# Patient Record
Sex: Female | Born: 1940 | Race: White | Hispanic: No | Smoking: Former smoker
Health system: Southern US, Community
[De-identification: ages and names within clinical notes are randomized; demographics above are authoritative.]

## PROBLEM LIST (undated history)

## (undated) DIAGNOSIS — E785 Hyperlipidemia, unspecified: Secondary | ICD-10-CM

## (undated) DIAGNOSIS — I10 Essential (primary) hypertension: Secondary | ICD-10-CM

## (undated) DIAGNOSIS — E119 Type 2 diabetes mellitus without complications: Secondary | ICD-10-CM

## (undated) HISTORY — DX: Essential (primary) hypertension: I10

## (undated) HISTORY — DX: Hyperlipidemia, unspecified: E78.5

## (undated) HISTORY — DX: Type 2 diabetes mellitus without complications: E11.9

---

## 2006-10-25 ENCOUNTER — Ambulatory Visit: Payer: Self-pay | Admitting: Family Medicine

## 2006-10-26 ENCOUNTER — Ambulatory Visit: Payer: Self-pay | Admitting: Gastroenterology

## 2007-02-06 ENCOUNTER — Ambulatory Visit: Payer: Self-pay | Admitting: Gastroenterology

## 2008-02-26 ENCOUNTER — Ambulatory Visit: Payer: Self-pay | Admitting: Ophthalmology

## 2009-06-22 ENCOUNTER — Ambulatory Visit: Payer: Self-pay | Admitting: Gastroenterology

## 2009-06-22 LAB — HM COLONOSCOPY

## 2011-05-19 LAB — HM DEXA SCAN: HM DEXA SCAN: NORMAL

## 2013-05-12 ENCOUNTER — Emergency Department: Payer: Self-pay | Admitting: Emergency Medicine

## 2013-05-12 LAB — CBC
HCT: 37.2 % (ref 35.0–47.0)
HGB: 13 g/dL (ref 12.0–16.0)
MCH: 31 pg (ref 26.0–34.0)
MCHC: 34.9 g/dL (ref 32.0–36.0)
MCV: 89 fL (ref 80–100)
Platelet: 227 10*3/uL (ref 150–440)
RBC: 4.18 10*6/uL (ref 3.80–5.20)
RDW: 13.1 % (ref 11.5–14.5)
WBC: 7.6 10*3/uL (ref 3.6–11.0)

## 2013-05-12 LAB — BASIC METABOLIC PANEL
Anion Gap: 2 — ABNORMAL LOW (ref 7–16)
BUN: 33 mg/dL — ABNORMAL HIGH (ref 7–18)
CHLORIDE: 108 mmol/L — AB (ref 98–107)
CO2: 28 mmol/L (ref 21–32)
CREATININE: 1.23 mg/dL (ref 0.60–1.30)
Calcium, Total: 8.5 mg/dL (ref 8.5–10.1)
EGFR (African American): 51 — ABNORMAL LOW
EGFR (Non-African Amer.): 44 — ABNORMAL LOW
Glucose: 195 mg/dL — ABNORMAL HIGH (ref 65–99)
Osmolality: 288 (ref 275–301)
POTASSIUM: 4.7 mmol/L (ref 3.5–5.1)
Sodium: 138 mmol/L (ref 136–145)

## 2014-01-01 ENCOUNTER — Ambulatory Visit: Payer: Self-pay | Admitting: Ophthalmology

## 2014-01-01 DIAGNOSIS — Z01810 Encounter for preprocedural cardiovascular examination: Secondary | ICD-10-CM

## 2014-01-01 DIAGNOSIS — I10 Essential (primary) hypertension: Secondary | ICD-10-CM

## 2014-01-06 LAB — BASIC METABOLIC PANEL
BUN: 35 mg/dL — AB (ref 4–21)
CREATININE: 1.2 mg/dL — AB (ref 0.5–1.1)
GLUCOSE: 176 mg/dL
POTASSIUM: 5.4 mmol/L — AB (ref 3.4–5.3)
SODIUM: 139 mmol/L (ref 137–147)

## 2014-01-06 LAB — LIPID PANEL
CHOLESTEROL: 159 mg/dL (ref 0–200)
HDL: 44 mg/dL (ref 35–70)
LDL Cholesterol: 90 mg/dL
Triglycerides: 125 mg/dL (ref 40–160)

## 2014-01-14 ENCOUNTER — Ambulatory Visit: Payer: Self-pay | Admitting: Ophthalmology

## 2014-08-09 NOTE — Op Note (Signed)
PATIENT NAME:  Rachel Lester, Rachel Lester MR#:  [id number] DATE OF BIRTH:  07/14/1940  DATE OF PROCEDURE:  01/14/2014  PREOPERATIVE DIAGNOSIS: Visually significant cataract of the right eye.   POSTOPERATIVE DIAGNOSIS: Visually significant cataract of the right eye.   OPERATIVE PROCEDURE: Cataract extraction by phacoemulsification with implant of intraocular lens to right eye.   SURGEON: Birder Robson, MD.   ANESTHESIA:  1. Managed anesthesia care.  2. Topical tetracaine drops followed by 2% Xylocaine jelly applied in the preoperative holding area.   COMPLICATIONS: None.   TECHNIQUE:  Stop and chop.  DESCRIPTION OF PROCEDURE: The patient was examined and consented in the preoperative holding area where the aforementioned topical anesthesia was applied to the right eye and then brought back to the Operating Room where the right eye was prepped and draped in the usual sterile ophthalmic fashion and a lid speculum was placed. A paracentesis was created with the side port blade and the anterior chamber was filled with viscoelastic. A near clear corneal incision was performed with the steel keratome. A continuous curvilinear capsulorrhexis was performed with a cystotome followed by the capsulorrhexis forceps. Hydrodissection and hydrodelineation were carried out with BSS on a blunt cannula. The lens was removed in a stop and chop technique and the remaining cortical material was removed with the irrigation-aspiration handpiece. The capsular bag was inflated with viscoelastic and the Alcon SN60WF 18.5-diopter lens, serial number [id number] was placed in the capsular bag without complication. The remaining viscoelastic was removed from the eye with the irrigation-aspiration handpiece. The wounds were hydrated. The anterior chamber was flushed with Miostat and the eye was inflated to physiologic pressure. 0.1 mL of cefuroxime concentration 10 mg/mL was placed in the anterior chamber. The wounds were found to be  water tight. The eye was dressed with Vigamox. The patient was given protective glasses to wear throughout the day and a shield with which to sleep tonight. The patient was also given drops with which to begin a drop regimen today and will follow-up with me in one day.    ____________________________ Livingston Diones. Carlisa Eble, MD wlp:TT D: 01/14/2014 14:05:00 ET T: 01/14/2014 14:18:43 ET JOB#: [id number]  cc: Keundra Petrucelli L. Kevontae Burgoon, MD, <Dictator> Livingston Diones Shaheen Star MD ELECTRONICALLY SIGNED 01/15/2014 11:40

## 2014-08-18 DIAGNOSIS — I10 Essential (primary) hypertension: Secondary | ICD-10-CM | POA: Diagnosis not present

## 2014-08-18 DIAGNOSIS — N183 Chronic kidney disease, stage 3 (moderate): Secondary | ICD-10-CM | POA: Diagnosis not present

## 2014-08-18 DIAGNOSIS — E780 Pure hypercholesterolemia: Secondary | ICD-10-CM | POA: Diagnosis not present

## 2014-08-18 LAB — HEMOGLOBIN A1C: HEMOGLOBIN A1C: 7.4 % — AB (ref 4.0–6.0)

## 2014-11-24 ENCOUNTER — Other Ambulatory Visit: Payer: Self-pay | Admitting: Family Medicine

## 2014-12-01 LAB — HM DIABETES EYE EXAM

## 2014-12-02 DIAGNOSIS — E875 Hyperkalemia: Secondary | ICD-10-CM

## 2014-12-02 DIAGNOSIS — N183 Chronic kidney disease, stage 3 unspecified: Secondary | ICD-10-CM

## 2014-12-02 DIAGNOSIS — E1122 Type 2 diabetes mellitus with diabetic chronic kidney disease: Secondary | ICD-10-CM

## 2014-12-02 DIAGNOSIS — R809 Proteinuria, unspecified: Secondary | ICD-10-CM

## 2014-12-02 DIAGNOSIS — R319 Hematuria, unspecified: Secondary | ICD-10-CM

## 2014-12-02 DIAGNOSIS — I10 Essential (primary) hypertension: Secondary | ICD-10-CM

## 2014-12-02 DIAGNOSIS — E7800 Pure hypercholesterolemia, unspecified: Secondary | ICD-10-CM

## 2014-12-16 ENCOUNTER — Encounter: Payer: Self-pay | Admitting: Family Medicine

## 2014-12-29 ENCOUNTER — Other Ambulatory Visit: Payer: Self-pay | Admitting: Family Medicine

## 2014-12-30 DIAGNOSIS — N183 Chronic kidney disease, stage 3 (moderate): Secondary | ICD-10-CM

## 2014-12-30 DIAGNOSIS — E1122 Type 2 diabetes mellitus with diabetic chronic kidney disease: Secondary | ICD-10-CM | POA: Insufficient documentation

## 2014-12-30 DIAGNOSIS — R319 Hematuria, unspecified: Secondary | ICD-10-CM | POA: Insufficient documentation

## 2014-12-30 DIAGNOSIS — R809 Proteinuria, unspecified: Secondary | ICD-10-CM | POA: Insufficient documentation

## 2014-12-30 DIAGNOSIS — E875 Hyperkalemia: Secondary | ICD-10-CM | POA: Insufficient documentation

## 2014-12-30 DIAGNOSIS — N1832 Chronic kidney disease, stage 3b: Secondary | ICD-10-CM | POA: Insufficient documentation

## 2014-12-30 DIAGNOSIS — K5790 Diverticulosis of intestine, part unspecified, without perforation or abscess without bleeding: Secondary | ICD-10-CM | POA: Insufficient documentation

## 2014-12-30 DIAGNOSIS — I10 Essential (primary) hypertension: Secondary | ICD-10-CM | POA: Insufficient documentation

## 2014-12-30 DIAGNOSIS — E7800 Pure hypercholesterolemia, unspecified: Secondary | ICD-10-CM | POA: Insufficient documentation

## 2015-01-12 ENCOUNTER — Ambulatory Visit (INDEPENDENT_AMBULATORY_CARE_PROVIDER_SITE_OTHER): Payer: Medicare Other | Admitting: Family Medicine

## 2015-01-12 ENCOUNTER — Encounter: Payer: Self-pay | Admitting: Family Medicine

## 2015-01-12 VITALS — BP 140/60 | HR 76 | Temp 97.9°F | Resp 16 | Ht 66.5 in | Wt 157.0 lb

## 2015-01-12 DIAGNOSIS — Z23 Encounter for immunization: Secondary | ICD-10-CM | POA: Diagnosis not present

## 2015-01-12 DIAGNOSIS — E7800 Pure hypercholesterolemia, unspecified: Secondary | ICD-10-CM

## 2015-01-12 DIAGNOSIS — R809 Proteinuria, unspecified: Secondary | ICD-10-CM

## 2015-01-12 DIAGNOSIS — Z0000 Encounter for general adult medical examination without abnormal findings: Secondary | ICD-10-CM | POA: Diagnosis not present

## 2015-01-12 DIAGNOSIS — I10 Essential (primary) hypertension: Secondary | ICD-10-CM

## 2015-01-12 DIAGNOSIS — N189 Chronic kidney disease, unspecified: Secondary | ICD-10-CM | POA: Diagnosis not present

## 2015-01-12 DIAGNOSIS — N183 Chronic kidney disease, stage 3 unspecified: Secondary | ICD-10-CM

## 2015-01-12 DIAGNOSIS — E1122 Type 2 diabetes mellitus with diabetic chronic kidney disease: Secondary | ICD-10-CM

## 2015-01-12 NOTE — Progress Notes (Signed)
Patient: Rachel Lester, Female    DOB: 11-Mar-1941, 74 y.o.   MRN: [id number] Visit Date: 01/12/2015  Today's Provider: Lelon Huh, MD   Chief Complaint  Patient presents with  . Diabetes  . Hyperlipidemia  . Hypertension  . Annual Exam   Subjective:    Annual wellness visit Rachel Lester is a 74 y.o. female. She feels well. She reports exercising , walks 2 times a weeks for 5 minutes on the treadmill. She reports she is sleeping well.  Last PCP: 01/06/14 Last pap: 04/09/09 Normal Colonoscopy: 06/22/09 Diverticulosis. -----------------------------------------------------------  Diabetes Mellitus Type II, Follow-up:   Lab Results  Component Value Date   HGBA1C 7.4* 08/18/2014   Last seen for diabetes 5 months ago.  Management since then includes continue same medications. . She reports good compliance with treatment. She is not having side effects.    Episodes of hypoglycemia? no   Most Recent Eye Exam: September 2016 Weight trend: stable   ------------------------------------------------------------------------   Hypertension, follow-up:  BP Readings from Last 3 Encounters:  01/12/15 140/60  08/18/14 110/60    She was last seen for hypertension 5 months ago.  BP at that visit was 110/60 . Management since that visit includes Continue current medications.  She reports good compliance with treatment. She is not having side effects.  She is exercising. She is adherent to low salt diet.   Patient denies chest pain, chest pressure/discomfort, claudication, dyspnea, fatigue, irregular heart beat, lower extremity edema, orthopnea, palpitations, paroxysmal nocturnal dyspnea and syncope.     ------------------------------------------------------------------------    Lipid/Cholesterol, Follow-up:   Last seen for this 5 months ago.  Management since that visit includes continuing same medications. .  Last Lipid Panel:    Component Value  Date/Time   CHOL 159 01/06/2014   TRIG 125 01/06/2014   HDL 44 01/06/2014   LDLCALC 90 01/06/2014    She reports excellent compliance with treatment. She is not having side effects.   Wt Readings from Last 3 Encounters:  01/12/15 157 lb (71.215 kg)  08/18/14 153 lb (69.4 kg)    ------------------------------------------------------------------------     Review of Systems  Constitutional: Negative for fever, chills and fatigue.  HENT: Negative for congestion, ear pain, rhinorrhea, sneezing and sore throat.   Eyes: Negative.  Negative for pain and redness.  Respiratory: Negative for cough, shortness of breath and wheezing.   Cardiovascular: Negative for chest pain and leg swelling.  Gastrointestinal: Negative for nausea, abdominal pain, diarrhea, constipation and blood in stool.  Endocrine: Negative for polydipsia and polyphagia.  Genitourinary: Negative.  Negative for dysuria, hematuria, flank pain, vaginal bleeding, vaginal discharge and pelvic pain.  Musculoskeletal: Negative for back pain, joint swelling, arthralgias and gait problem.  Skin: Negative for rash.  Neurological: Negative.  Negative for dizziness, tremors, seizures, weakness, light-headedness, numbness and headaches.  Hematological: Negative for adenopathy.  Psychiatric/Behavioral: Negative.  Negative for behavioral problems, confusion and dysphoric mood. The patient is not nervous/anxious and is not hyperactive.     Social History   Social History  . Marital Status: Married    Spouse Name: N/A  . Number of Children: N/A  . Years of Education: N/A   Occupational History  . Not on file.   Social History Main Topics  . Smoking status: Former Research scientist (life sciences)  . Smokeless tobacco: Never Used     Comment: smoked about 2 packs per day, smoked for about 20 years; Qut in 1980  . Alcohol Use:  Yes     Comment: occasional  . Drug Use: Not on file  . Sexual Activity: Not on file   Other Topics Concern  . Not on file     Social History Narrative    Patient Active Problem List   Diagnosis Date Noted  . Diverticulosis 12/30/2014  . Diabetes mellitus with chronic kidney disease 12/30/2014  . Hypercholesterolemia 12/30/2014  . Hyperkalemia 12/30/2014  . Hypertension 12/30/2014  . Chronic kidney disease, stage III (moderate) 12/30/2014  . Hematuria 12/30/2014  . Microalbuminuria 12/30/2014    History reviewed. No pertinent past surgical history.  Her family history includes Alzheimer's disease in her mother; Arthritis in her sister; Coronary artery disease in her father; Diabetes in her brother and sister.    Previous Medications   ASPIRIN (ASPIRIN ADULT LOW STRENGTH) 81 MG EC TABLET    Take 81 mg by mouth daily. Swallow whole.   B COMPLEX VITAMINS TABLET    Take 1 tablet by mouth daily.   BIOTIN 7500 MCG TABS    Take by mouth.   CHOLECALCIFEROL (VITAMIN D3) 2000 UNITS CAPSULE    Take 2,000 Units by mouth daily.   CYANOCOBALAMIN (VITAMIN B 12 PO)    Take 600 mg by mouth daily.   GLIMEPIRIDE (AMARYL) 4 MG TABLET    Take 4 mg by mouth daily with breakfast.   GLUCOSE BLOOD (ACCU-CHEK AVIVA PLUS) TEST STRIP    ACCU-CHEK AVIVA PLUS (In Vitro Strip)  1 (one) Strip three times daily for 0 days  Quantity: 300;  Refills: 3   Ordered :27-Jan-2014  Lelon Huh MD;  Started 27-Jan-2014 Active   LOSARTAN-HYDROCHLOROTHIAZIDE (HYZAAR) 100-25 MG PER TABLET    TAKE ONE (1) TABLET BY MOUTH EVERY DAY   LOVASTATIN (MEVACOR) 40 MG TABLET    Take 40 mg by mouth at bedtime.   OMEGA-3 FATTY ACIDS (FISH OIL BURP-LESS) 1200 MG CAPS    Take 1 capsule by mouth daily.   PIOGLITAZONE (ACTOS) 15 MG TABLET    TAKE ONE (1) TABLET EACH DAY    Patient Care Team: Birdie Sons, MD as PCP - General (Family Medicine)     Objective:   Vitals: BP 140/60 mmHg  Pulse 76  Temp(Src) 97.9 F (36.6 C) (Oral)  Resp 16  Ht 5' 6.5" (1.689 m)  Wt 157 lb (71.215 kg)  BMI 24.96 kg/m2  Physical Exam   General Appearance:     Alert, cooperative, no distress, appears stated age  Head:    Normocephalic, without obvious abnormality, atraumatic  Eyes:    PERRL, conjunctiva/corneas clear, EOM's intact, fundi    benign, both eyes  Ears:    Normal TM's and external ear canals, both ears  Nose:   Nares normal, septum midline, mucosa normal, no drainage    or sinus tenderness  Throat:   Lips, mucosa, and tongue normal; teeth and gums normal  Neck:   Supple, symmetrical, trachea midline, no adenopathy;    thyroid:  no enlargement/tenderness/nodules; no carotid   bruit or JVD  Back:     Symmetric, no curvature, ROM normal, no CVA tenderness  Lungs:     Clear to auscultation bilaterally, respirations unlabored  Chest Wall:    No tenderness or deformity   Heart:    Regular rate and rhythm, S1 and S2 normal, no murmur, rub   or gallop  Breast Exam:    normal appearance, no masses or tenderness  Abdomen:     Soft, non-tender, bowel sounds active  all four quadrants,    no masses, no organomegaly  Pelvic:    deferred  Extremities:   Extremities normal, atraumatic, no cyanosis or edema  Pulses:   2+ and symmetric all extremities  Skin:   Skin color, texture, turgor normal, no rashes or lesions  Lymph nodes:   Cervical, supraclavicular, and axillary nodes normal  Neurologic:   CNII-XII intact, normal strength, sensation and reflexes    throughout   Diabetic Foot Exam - Simple   Simple Foot Form  Diabetic Foot exam was performed with the following findings:  Yes 01/12/2015 10:22 AM  Visual Inspection  No deformities, no ulcerations, no other skin breakdown bilaterally:  Yes  Sensation Testing  Intact to touch and monofilament testing bilaterally:  Yes  Pulse Check  Posterior Tibialis and Dorsalis pulse intact bilaterally:  Yes  Comments      Activities of Daily Living In your present state of health, do you have any difficulty performing the following activities: 01/12/2015  Hearing? N  Vision? N  Difficulty  concentrating or making decisions? N  Walking or climbing stairs? N  Dressing or bathing? N  Doing errands, shopping? N    Fall Risk Assessment Fall Risk  01/12/2015  Falls in the past year? No     Depression Screen PHQ 2/9 Scores 01/12/2015  PHQ - 2 Score 0    Cognitive Testing - 6-CIT  Correct? Score   What year is it? yes 0 0 or 4  What month is it? yes 0 0 or 3  Memorize:    Pia Mau,  43,  [place],      What time is it? (within 1 hour) yes 0 0 or 3  Count backwards from 20 yes 0 0, 2, or 4  Name the months of the year yes 0 0, 2, or 4  Repeat name & address above yes 0 0, 2, 4, 6, 8, or 10       TOTAL SCORE  0/28   Interpretation:  Normal  Normal (0-7) Abnormal (8-28)  Audit-C Alcohol Use Screening  Question Answer Points  How often do you have alcoholic drink? 1 times monthly 1  How many drinks do you typically consume in a day? 0 0  How oftey will you drink 6 or more in a total? never 0  Total Score:  1   A score of 3 or more in women, and 4 or more in men indicates increased risk for alcohol abuse, EXCEPT if all of the points are from question 1.     Assessment & Plan:     Annual Physical Visit  Reviewed patient's Family Medical History Reviewed and updated list of patient's medical providers Assessment of cognitive impairment was done Assessed patient's functional ability Established a written schedule for health screening [place] Completed and Reviewed  Exercise Activities and Dietary recommendations Goals    None      Immunization History  Administered Date(s) Administered  . Pneumococcal Conjugate-13 10/07/2013  . Pneumococcal Polysaccharide-23 02/17/2004, 01/30/2011  . Td 09/01/2006  . Zoster 01/30/2009    Health Maintenance  Topic Date Due  . FOOT EXAM  05/05/1951  . URINE MICROALBUMIN  05/05/1951  . MAMMOGRAM  01/12/2015  . DEXA SCAN  05/04/2006  . INFLUENZA VACCINE  11/17/2014  . HEMOGLOBIN  A1C  02/18/2015  . OPHTHALMOLOGY EXAM  12/01/2015  . TETANUS/TDAP  08/31/2016  . COLONOSCOPY  06/23/2019  . ZOSTAVAX  Completed  .  PNA vac Low Risk Adult  Completed      Discussed health benefits of physical activity, and encouraged her to engage in regular exercise appropriate for her age and condition.    ------------------------------------------------------------------------------------------------------------  1. Annual physical exam Generally doing well  2. Need for influenza vaccination  - Flu vaccine HIGH DOSE PF  3. Chronic kidney disease, stage III (moderate)  - Renal function panel  4. Type 2 diabetes mellitus with diabetic chronic kidney disease  - Hemoglobin A1c  5. Hypercholesterolemia  - Lipid panel  6. Essential hypertension  - EKG 12-Lead

## 2015-01-13 ENCOUNTER — Telehealth: Payer: Self-pay

## 2015-01-13 LAB — RENAL FUNCTION PANEL
ALBUMIN: 4.2 g/dL (ref 3.5–4.8)
BUN/Creatinine Ratio: 24 (ref 11–26)
BUN: 30 mg/dL — ABNORMAL HIGH (ref 8–27)
CO2: 21 mmol/L (ref 18–29)
CREATININE: 1.26 mg/dL — AB (ref 0.57–1.00)
Calcium: 9.7 mg/dL (ref 8.7–10.3)
Chloride: 106 mmol/L (ref 97–108)
GFR calc Af Amer: 49 mL/min/{1.73_m2} — ABNORMAL LOW (ref >59)
GFR calc non Af Amer: 42 mL/min/{1.73_m2} — ABNORMAL LOW (ref >59)
Glucose: 152 mg/dL — ABNORMAL HIGH (ref 65–99)
PHOSPHORUS: 4 mg/dL (ref 2.5–4.5)
POTASSIUM: 5.5 mmol/L — AB (ref 3.5–5.2)
SODIUM: 141 mmol/L (ref 134–144)

## 2015-01-13 LAB — LIPID PANEL
CHOL/HDL RATIO: 3.5 ratio (ref 0.0–4.4)
Cholesterol, Total: 160 mg/dL (ref 100–199)
HDL: 46 mg/dL (ref >39)
LDL Calculated: 91 mg/dL (ref 0–99)
TRIGLYCERIDES: 117 mg/dL (ref 0–149)
VLDL Cholesterol Cal: 23 mg/dL (ref 5–40)

## 2015-01-13 LAB — HEMOGLOBIN A1C
Est. average glucose Bld gHb Est-mCnc: 166 mg/dL
Hgb A1c MFr Bld: 7.4 % — ABNORMAL HIGH (ref 4.8–5.6)

## 2015-01-13 NOTE — Telephone Encounter (Signed)
-----   Message from Birdie Sons, MD sent at 01/13/2015  7:45 AM EDT ----- A1c is steady at 7.4. Cholesterol well controlled at 160. Slightly impaired kidney functions with mildly elevated potassium. Avoid potassium rich foods (e.g. Fruit juices, bananas) increase water consumption to keep help maintain kidney functions. Continue current medications.  Follow up for diabetes in 4 months.

## 2015-01-13 NOTE — Telephone Encounter (Signed)
Pt returning call.  [id number]

## 2015-01-13 NOTE — Telephone Encounter (Signed)
LMTCB  Thanks,  -Joseline 

## 2015-01-13 NOTE — Telephone Encounter (Signed)
Patient advised as directed below. Patient will call to schedule appointment.  Thanks,  -Joseline

## 2015-02-28 ENCOUNTER — Other Ambulatory Visit: Payer: Self-pay | Admitting: Family Medicine

## 2015-07-01 ENCOUNTER — Other Ambulatory Visit: Payer: Self-pay | Admitting: Family Medicine

## 2015-08-14 DIAGNOSIS — E113293 Type 2 diabetes mellitus with mild nonproliferative diabetic retinopathy without macular edema, bilateral: Secondary | ICD-10-CM | POA: Diagnosis not present

## 2015-08-14 LAB — HM DIABETES EYE EXAM

## 2015-08-17 ENCOUNTER — Encounter: Payer: Self-pay | Admitting: *Deleted

## 2015-11-11 ENCOUNTER — Other Ambulatory Visit: Payer: Self-pay | Admitting: Family Medicine

## 2016-01-11 ENCOUNTER — Other Ambulatory Visit: Payer: Self-pay | Admitting: Family Medicine

## 2016-02-09 ENCOUNTER — Telehealth: Payer: Self-pay | Admitting: Family Medicine

## 2016-02-09 NOTE — Telephone Encounter (Signed)
Left # with family member pt to call back to schedule AWV with [facility] - knb

## 2016-02-22 ENCOUNTER — Other Ambulatory Visit: Payer: Self-pay | Admitting: Family Medicine

## 2016-03-11 ENCOUNTER — Other Ambulatory Visit: Payer: Self-pay | Admitting: Family Medicine

## 2016-03-29 ENCOUNTER — Ambulatory Visit: Payer: Medicare Other

## 2016-03-29 ENCOUNTER — Encounter: Payer: Self-pay | Admitting: Family Medicine

## 2016-03-29 ENCOUNTER — Ambulatory Visit (INDEPENDENT_AMBULATORY_CARE_PROVIDER_SITE_OTHER): Payer: Medicare Other | Admitting: Family Medicine

## 2016-03-29 VITALS — BP 164/72 | HR 76 | Temp 98.0°F | Resp 16 | Ht 67.5 in | Wt 167.0 lb

## 2016-03-29 DIAGNOSIS — N183 Chronic kidney disease, stage 3 unspecified: Secondary | ICD-10-CM

## 2016-03-29 DIAGNOSIS — E1122 Type 2 diabetes mellitus with diabetic chronic kidney disease: Secondary | ICD-10-CM

## 2016-03-29 DIAGNOSIS — E7800 Pure hypercholesterolemia, unspecified: Secondary | ICD-10-CM | POA: Diagnosis not present

## 2016-03-29 DIAGNOSIS — I10 Essential (primary) hypertension: Secondary | ICD-10-CM

## 2016-03-29 DIAGNOSIS — Z0000 Encounter for general adult medical examination without abnormal findings: Secondary | ICD-10-CM | POA: Diagnosis not present

## 2016-03-29 DIAGNOSIS — Z23 Encounter for immunization: Secondary | ICD-10-CM | POA: Diagnosis not present

## 2016-03-29 NOTE — Patient Instructions (Signed)
   Please contact your eyecare professional to schedule a routine eye exam  

## 2016-03-29 NOTE — Progress Notes (Signed)
Patient: Rachel Lester, Female    DOB: 12-Jun-1940, 75 y.o.   MRN: [id number] Visit Date: 03/29/2016  Today's Provider: Lelon Huh, MD   Chief Complaint  Patient presents with  . Annual Exam  . Hyperlipidemia    follow up  . Hypertension    follow up  . Diabetes    follow up   Subjective:    Annual physical Rachel Lester is a 75 y.o. female. She feels well. She reports never exercising. She reports she is sleeping fairly well.  -----------------------------------------------------------  Diabetes Mellitus Type II, Follow-up:   Lab Results  Component Value Date   HGBA1C 7.4 (H) 01/12/2015   HGBA1C 7.4 (A) 08/18/2014   Last seen for diabetes 1 years ago.  Management since then includes no changes. She reports fair compliance with treatment. Has been out of medications off and on for a few weeks.  She is not having side effects.  Current symptoms include none and have been stable. Home blood sugar records: fasting range: 160-180  Episodes of hypoglycemia? no   Current Insulin Regimen: none Most Recent Eye Exam: over 1 year ago Weight trend: increasing steadily Prior visit with dietician: no Current diet: well balanced Current exercise: none  ------------------------------------------------------------------------   Hypertension, follow-up:  BP Readings from Last 3 Encounters:  01/12/15 140/60  08/18/14 110/60    She was last seen for hypertension 1 years ago.  BP at that visit was 140/60. Management since that visit includes no changes.She reports good compliance with treatment. She is not having side effects.  She is not exercising. She is adherent to low salt diet.   Outside blood pressures are 148/70. She is experiencing none.  Patient denies chest pain, chest pressure/discomfort, claudication, dyspnea, exertional chest pressure/discomfort, fatigue, irregular heart beat, lower extremity edema, near-syncope, orthopnea, palpitations,  paroxysmal nocturnal dyspnea, syncope and tachypnea.   Cardiovascular risk factors include advanced age (older than 21 for men, 17 for women), diabetes mellitus, dyslipidemia and hypertension.  Use of agents associated with hypertension: NSAIDS.   ------------------------------------------------------------------------    Lipid/Cholesterol, Follow-up:   Last seen for this 1 years ago.  Management since that visit includes no changes.  Last Lipid Panel:    Component Value Date/Time   CHOL 160 01/12/2015 1027   TRIG 117 01/12/2015 1027   HDL 46 01/12/2015 1027   CHOLHDL 3.5 01/12/2015 1027   LDLCALC 91 01/12/2015 1027    She reports good compliance with treatment. She is not having side effects.   Wt Readings from Last 3 Encounters:  01/12/15 157 lb (71.2 kg)  08/18/14 153 lb (69.4 kg)    ------------------------------------------------------------------------   Review of Systems  Constitutional: Negative for chills, fatigue and fever.  HENT: Negative for congestion, ear pain, rhinorrhea, sneezing and sore throat.   Eyes: Negative.  Negative for pain and redness.  Respiratory: Negative for cough, shortness of breath and wheezing.   Cardiovascular: Negative for chest pain and leg swelling.  Gastrointestinal: Negative for abdominal pain, blood in stool, constipation, diarrhea and nausea.  Endocrine: Negative for polydipsia and polyphagia.  Genitourinary: Negative.  Negative for dysuria, flank pain, hematuria, pelvic pain, vaginal bleeding and vaginal discharge.  Musculoskeletal: Negative for arthralgias, back pain, gait problem and joint swelling.  Skin: Negative for rash.  Neurological: Negative.  Negative for dizziness, tremors, seizures, weakness, light-headedness, numbness and headaches.  Hematological: Negative for adenopathy.  Psychiatric/Behavioral: Negative.  Negative for behavioral problems, confusion and dysphoric mood. The patient is  not nervous/anxious and is  not hyperactive.     Social History   Social History  . Marital status: Married    Spouse name: N/A  . Number of children: 2  . Years of education: N/A   Occupational History  . Not on file.   Social History Main Topics  . Smoking status: Former Smoker    Packs/day: 2.00    Years: 20.00    Types: Cigarettes  . Smokeless tobacco: Never Used     Comment: smoked about 2 packs per day, smoked for about 20 years; Quit in 1980  . Alcohol use 0.0 oz/week     Comment: occasional  . Drug use: No  . Sexual activity: Not on file   Other Topics Concern  . Not on file   Social History Narrative  . No narrative on file    No past medical history on file.   Patient Active Problem List   Diagnosis Date Noted  . Diverticulosis 12/30/2014  . Diabetes mellitus with chronic kidney disease ([place]) 12/30/2014  . Hypercholesterolemia 12/30/2014  . Hyperkalemia 12/30/2014  . Hypertension 12/30/2014  . Chronic kidney disease, stage III (moderate) 12/30/2014  . Hematuria 12/30/2014  . Microalbuminuria 12/30/2014    No past surgical history on file.  Her family history includes Alzheimer's disease in her mother; Arthritis in her sister; Coronary artery disease in her father; Diabetes in her brother and sister.        Patient Care Team: Birdie Sons, MD as PCP - General (Family Medicine) Birder Robson, MD as Referring Physician (Ophthalmology)     Objective:   Vitals: BP (!) 164/72 (BP Location: Right Arm, Patient Position: Sitting, Cuff Size: Normal)   Pulse 76   Temp 98 F (36.7 C) (Oral)   Resp 16   Ht 5' 7.5" (1.715 m)   Wt 167 lb (75.8 kg)   SpO2 97% Comment: room air  BMI 25.77 kg/m   Physical Exam    General Appearance:    Alert, cooperative, no distress, appears stated age  Head:    Normocephalic, without obvious abnormality, atraumatic  Eyes:    PERRL, conjunctiva/corneas clear, EOM's intact, fundi    benign, both eyes  Ears:    Normal TM's and external  ear canals, both ears  Nose:   Nares normal, septum midline, mucosa normal, no drainage    or sinus tenderness  Throat:   Lips, mucosa, and tongue normal; teeth and gums normal  Neck:   Supple, symmetrical, trachea midline, no adenopathy;    thyroid:  no enlargement/tenderness/nodules; no carotid   bruit or JVD  Back:     Symmetric, no curvature, ROM normal, no CVA tenderness  Lungs:     Clear to auscultation bilaterally, respirations unlabored  Chest Wall:    No tenderness or deformity   Heart:    Regular rate and rhythm, S1 and S2 normal, no murmur, rub   or gallop  Breast Exam:    normal appearance, no masses or tenderness  Abdomen:     Soft, non-tender, bowel sounds active all four quadrants,    no masses, no organomegaly  Pelvic:    deferred  Extremities:   Extremities normal, atraumatic, no cyanosis or edema  Pulses:   2+ and symmetric all extremities  Skin:   Skin color, texture, turgor normal, no rashes or lesions  Lymph nodes:   Cervical, supraclavicular, and axillary nodes normal  Neurologic:   CNII-XII intact, normal strength, sensation and reflexes  throughout    Activities of Daily Living In your present state of health, do you have any difficulty performing the following activities: 03/29/2016  Hearing? N  Vision? N  Difficulty concentrating or making decisions? N  Walking or climbing stairs? N  Dressing or bathing? N  Doing errands, shopping? N  Some recent data might be hidden    Fall Risk Assessment Fall Risk  03/29/2016 01/12/2015  Falls in the past year? No No     Depression Screen PHQ 2/9 Scores 03/29/2016 01/12/2015  PHQ - 2 Score 0 0    Cognitive Testing - 6-CIT  Correct? Score   What year is it? yes 0 0 or 4  What month is it? yes 0 0 or 3  Memorize:    Pia Mau,  43,  [address]  [place],      What time is it? (within 1 hour) yes 0 0 or 3  Count backwards from 20 yes 0 0, 2, or 4  Name the months of the year yes 0 0, 2, or 4  Repeat  name & address above yes Normal 0, 2, 4, 6, 8, or 10       TOTAL SCORE  0/28   Interpretation:  Normal  Normal (0-7) Abnormal (8-28)    Audit-C Alcohol Use Screening  Question Answer Points  How often do you have alcoholic drink? 1 times monthly 1  On days you do drink alcohol, how many drinks do you typically consume? 1 0  How oftey will you drink 6 or more in a total? less than 1 times monthly 1  Total Score:  0   A score of 3 or more in women, and 4 or more in men indicates increased risk for alcohol abuse, EXCEPT if all of the points are from question 1.  Current Exercise Habits: The patient does not participate in regular exercise at present Exercise limited by: None identified   Assessment & Plan:     Annual Wellness Visit  Reviewed patient's Family Medical History Reviewed and updated list of patient's medical providers Assessment of cognitive impairment was done Assessed patient's functional ability Established a written schedule for health screening [place] Completed and Reviewed  Exercise Activities and Dietary recommendations Goals    None      Immunization History  Administered Date(s) Administered  . Influenza, High Dose Seasonal PF 01/12/2015  . Pneumococcal Conjugate-13 10/07/2013  . Pneumococcal Polysaccharide-23 02/17/2004, 02/09/2011  . Td 09/01/2006  . Zoster 04/09/2009    Health Maintenance  Topic Date Due  . MAMMOGRAM  05/05/1991  . HEMOGLOBIN A1C  07/12/2015  . INFLUENZA VACCINE  11/17/2015  . FOOT EXAM  01/12/2016  . OPHTHALMOLOGY EXAM  08/13/2016  . TETANUS/TDAP  08/31/2016  . COLONOSCOPY  06/23/2019  . DEXA SCAN  Completed  . ZOSTAVAX  Completed  . PNA vac Low Risk Adult  Completed     Discussed health benefits of physical activity, and encouraged her to engage in regular exercise appropriate for her age and condition.      ------------------------------------------------------------------------------------------------------------  1. Annual physical exam Generally doing well.   2. Need for influenza vaccination  - Flu vaccine HIGH DOSE PF (Fluzone High dose)  3. Essential hypertension Well controlled.  Continue current medications.    4. Type 2 diabetes mellitus with stage 3 chronic kidney disease, without long-term current use of insulin ([place]) Doing well on current medications.   5. Chronic kidney disease, stage III (moderate)  -  Renal function panel  6. Hypercholesterolemia She is tolerating lovastatin well with no adverse effects.    - Lipid panel - Hepatic function panel   Lelon Huh, MD  [facility]

## 2016-03-30 ENCOUNTER — Telehealth: Payer: Self-pay

## 2016-03-30 LAB — LIPID PANEL
CHOLESTEROL TOTAL: 168 mg/dL (ref 100–199)
Chol/HDL Ratio: 3.8 ratio units (ref 0.0–4.4)
HDL: 44 mg/dL (ref >39)
LDL Calculated: 91 mg/dL (ref 0–99)
TRIGLYCERIDES: 165 mg/dL — AB (ref 0–149)
VLDL Cholesterol Cal: 33 mg/dL (ref 5–40)

## 2016-03-30 LAB — HEPATIC FUNCTION PANEL
ALK PHOS: 84 IU/L (ref 39–117)
ALT: 20 IU/L (ref 0–32)
AST: 19 IU/L (ref 0–40)
BILIRUBIN TOTAL: 0.2 mg/dL (ref 0.0–1.2)
BILIRUBIN, DIRECT: 0.06 mg/dL (ref 0.00–0.40)
TOTAL PROTEIN: 7.1 g/dL (ref 6.0–8.5)

## 2016-03-30 LAB — RENAL FUNCTION PANEL
Albumin: 4.3 g/dL (ref 3.5–4.8)
BUN/Creatinine Ratio: 27 (ref 12–28)
BUN: 39 mg/dL — AB (ref 8–27)
CALCIUM: 9.3 mg/dL (ref 8.7–10.3)
CO2: 21 mmol/L (ref 18–29)
CREATININE: 1.47 mg/dL — AB (ref 0.57–1.00)
Chloride: 103 mmol/L (ref 96–106)
GFR calc Af Amer: 40 mL/min/{1.73_m2} — ABNORMAL LOW (ref >59)
GFR calc non Af Amer: 35 mL/min/{1.73_m2} — ABNORMAL LOW (ref >59)
Glucose: 221 mg/dL — ABNORMAL HIGH (ref 65–99)
PHOSPHORUS: 3.7 mg/dL (ref 2.5–4.5)
Potassium: 4.9 mmol/L (ref 3.5–5.2)
Sodium: 140 mmol/L (ref 134–144)

## 2016-03-30 MED ORDER — PIOGLITAZONE HCL 30 MG PO TABS: 30.0000 mg | ORAL_TABLET | Freq: Every day | ORAL | 3 refills | Status: DC

## 2016-03-30 NOTE — Telephone Encounter (Signed)
-----   Message from Birdie Sons, MD sent at 03/30/2016  7:53 AM EST ----- Blood sugar is elevated at 221. Kidney functions have declined a little bit. Need to drink more water. Need to increase pioglitazone to 30mg  daily, #30, rf x 3 and schedule follow up in 3 months.

## 2016-03-30 NOTE — Telephone Encounter (Signed)
Advised patient as below. Medication was sent into the pharmacy. Appt was scheduled.

## 2016-03-30 NOTE — Telephone Encounter (Signed)
Left message to call back  

## 2016-04-07 DIAGNOSIS — Z1231 Encounter for screening mammogram for malignant neoplasm of breast: Secondary | ICD-10-CM | POA: Diagnosis not present

## 2016-04-14 ENCOUNTER — Other Ambulatory Visit: Payer: Self-pay | Admitting: Family Medicine

## 2016-04-26 ENCOUNTER — Other Ambulatory Visit: Payer: Self-pay | Admitting: Family Medicine

## 2016-06-28 ENCOUNTER — Encounter: Payer: Self-pay | Admitting: Family Medicine

## 2016-06-28 ENCOUNTER — Ambulatory Visit (INDEPENDENT_AMBULATORY_CARE_PROVIDER_SITE_OTHER): Payer: Medicare Other | Admitting: Family Medicine

## 2016-06-28 VITALS — BP 130/64 | HR 82 | Temp 97.6°F | Resp 16 | Ht 68.0 in | Wt 172.0 lb

## 2016-06-28 DIAGNOSIS — N183 Chronic kidney disease, stage 3 (moderate): Secondary | ICD-10-CM

## 2016-06-28 DIAGNOSIS — E1122 Type 2 diabetes mellitus with diabetic chronic kidney disease: Secondary | ICD-10-CM | POA: Diagnosis not present

## 2016-06-28 LAB — POCT GLYCOSYLATED HEMOGLOBIN (HGB A1C)
ESTIMATED AVERAGE GLUCOSE: 197
HEMOGLOBIN A1C: 8.5

## 2016-06-28 NOTE — Progress Notes (Signed)
Patient: Rachel Lester Female    DOB: 19-Jul-1940   76 y.o.   MRN: [id number] Visit Date: 06/28/2016  Today's Provider: Lelon Huh, MD   Chief Complaint  Patient presents with  . Follow-up  . Hypertension  . Diabetes   Subjective:    HPI   Diabetes Mellitus Type II, Follow-up:   Lab Results  Component Value Date   HGBA1C 8.5 06/28/2016   HGBA1C 7.4 (H) 01/12/2015   HGBA1C 7.4 (A) 08/18/2014   Last seen for diabetes 3 months ago.  Management since then includes; increased pioglitazone to 30 mg qd. She reports good compliance with treatment. She is not having side effects. none Current symptoms include none and have been unchanged. Home blood sugar records: fasting range: 145-200   She states her mother passed away in 24-May-2022 and states she was eating very poorly for several weeks. Her fastings were running over 200 for several weeks, but for the last few weeks have been in the mid 100s.  Episodes of hypoglycemia? no   Current Insulin Regimen: none Most Recent Eye Exam: 08/14/15 Weight trend: stable Prior visit with dietician: no Current diet: well balanced Current exercise: walking  ----------------------------------------------------------------   Hypertension, follow-up:  BP Readings from Last 3 Encounters:  06/28/16 130/64  03/29/16 (!) 164/72  01/12/15 140/60    She was last seen for hypertension 3 months ago.  BP at that visit was 164/72. Management since that visit includes; kidney functions declined a bit. Patient advised to increase water intake .She reports good compliance with treatment. She is not having side effects. none She is exercising. She is adherent to low salt diet.   Outside blood pressures are 130/70. She is experiencing none.  Patient denies none.   Cardiovascular risk factors include diabetes mellitus.  Use of agents associated with hypertension: none.    ----------------------------------------------------------------    Allergies  Allergen Reactions  . Metformin Hcl     Weight Loss     Current Outpatient Prescriptions:  .  ACCU-CHEK AVIVA PLUS test strip, Check blood sugar three  times daily, Disp: 100 each, Rfl: 6 .  aspirin (ASPIRIN ADULT LOW STRENGTH) 81 MG EC tablet, Take 81 mg by mouth daily. Swallow whole., Disp: , Rfl:  .  b complex vitamins tablet, Take 1 tablet by mouth daily., Disp: , Rfl:  .  Biotin 7500 MCG TABS, Take by mouth., Disp: , Rfl:  .  Cholecalciferol (VITAMIN D3) 2000 UNITS capsule, Take 2,000 Units by mouth daily., Disp: , Rfl:  .  Cyanocobalamin (VITAMIN B 12 PO), Take 600 mg by mouth daily., Disp: , Rfl:  .  glimepiride (AMARYL) 4 MG tablet, TAKE ONE TABLET EVERY MORNING, Disp: 30 tablet, Rfl: 12 .  losartan-hydrochlorothiazide (HYZAAR) 100-25 MG tablet, TAKE ONE (1) TABLET EACH DAY, Disp: 30 tablet, Rfl: 12 .  lovastatin (MEVACOR) 40 MG tablet, TAKE ONE (1) TABLET AT BEDTIME, Disp: 30 tablet, Rfl: 12 .  Omega-3 Fatty Acids (FISH OIL BURP-LESS) 1200 MG CAPS, Take 1 capsule by mouth daily., Disp: , Rfl:  .  pioglitazone (ACTOS) 30 MG tablet, Take 1 tablet (30 mg total) by mouth daily., Disp: 30 tablet, Rfl: 3  Review of Systems  Constitutional: Negative for appetite change, chills, fatigue and fever.  Respiratory: Negative for chest tightness and shortness of breath.   Cardiovascular: Negative for chest pain and palpitations.  Gastrointestinal: Negative for abdominal pain, nausea and vomiting.  Neurological: Negative for dizziness and weakness.  Social History  Substance Use Topics  . Smoking status: Former Smoker    Packs/day: 2.00    Years: 20.00    Types: Cigarettes  . Smokeless tobacco: Never Used     Comment: smoked about 2 packs per day, smoked for about 20 years; Quit in 1980  . Alcohol use 0.0 oz/week     Comment: occasional   Objective:   BP 130/64 (BP Location: Right Arm, Patient  Position: Sitting, Cuff Size: Normal)   Pulse 82   Temp 97.6 F (36.4 C) (Oral)   Resp 16   Ht 5\' 8"  (1.727 m)   Wt 172 lb (78 kg)   SpO2 96%   BMI 26.15 kg/m     Physical Exam  General appearance: alert, well developed, well nourished, cooperative and in no distress Head: Normocephalic, without obvious abnormality, atraumatic Respiratory: Respirations even and unlabored, normal respiratory rate Extremities: No gross deformities Skin: Skin color, texture, turgor normal. No rashes seen  Psych: Appropriate mood and affect. Neurologic: Mental status: Alert, oriented to person, place, and time, thought content appropriate.  Results for orders placed or performed in visit on 06/28/16  POCT glycosylated hemoglobin (Hb A1C)  Result Value Ref Range   Hemoglobin A1C 8.5    Est. average glucose Bld gHb Est-mCnc 197        Assessment & Plan:     1. Type 2 diabetes mellitus with stage 3 chronic kidney disease, without long-term current use of insulin (HCC) Tolerating increased dose of pioglitazone. A1c up today due to deviation from diet, which she has recently gotten back on. Continue current medications.  Follow up for A1c in 3 months.  - POCT glycosylated hemoglobin (Hb A1C)       Lelon Huh, MD  [facility]

## 2016-07-04 ENCOUNTER — Telehealth: Payer: Self-pay

## 2016-07-04 ENCOUNTER — Encounter: Payer: Self-pay | Admitting: Family Medicine

## 2016-07-04 ENCOUNTER — Ambulatory Visit (INDEPENDENT_AMBULATORY_CARE_PROVIDER_SITE_OTHER): Payer: Medicare Other | Admitting: Family Medicine

## 2016-07-04 VITALS — BP 150/64 | HR 78 | Temp 98.1°F | Resp 16 | Wt 171.0 lb

## 2016-07-04 DIAGNOSIS — B029 Zoster without complications: Secondary | ICD-10-CM

## 2016-07-04 MED ORDER — VALACYCLOVIR HCL 1 G PO TABS: 1000.0000 mg | ORAL_TABLET | Freq: Three times a day (TID) | ORAL | 0 refills | Status: AC

## 2016-07-04 NOTE — Progress Notes (Signed)
Patient: Rachel Lester Female    DOB: 1941/02/01   76 y.o.   MRN: [id number] Visit Date: 07/04/2016  Today's Provider: Lelon Huh, MD   Chief Complaint  Patient presents with  . Rash   Subjective:    Rash  This is a new problem. Episode onset: 3 days ago. The problem has been gradually worsening since onset. The affected locations include the back (lower left side). The rash is characterized by burning, pain, itchiness and redness. She was exposed to nothing. Pertinent negatives include no anorexia, congestion, cough, diarrhea, facial edema, fatigue, fever, joint pain, rhinorrhea, shortness of breath, sore throat or vomiting. Treatments tried: Ibuprofen and heating pad. The treatment provided mild relief.  Patient describes the pain as a stabbing pain.      Allergies  Allergen Reactions  . Metformin Hcl     Weight Loss     Current Outpatient Prescriptions:  .  ACCU-CHEK AVIVA PLUS test strip, Check blood sugar three  times daily, Disp: 100 each, Rfl: 6 .  aspirin (ASPIRIN ADULT LOW STRENGTH) 81 MG EC tablet, Take 81 mg by mouth daily. Swallow whole., Disp: , Rfl:  .  b complex vitamins tablet, Take 1 tablet by mouth daily., Disp: , Rfl:  .  Biotin 7500 MCG TABS, Take by mouth., Disp: , Rfl:  .  Cholecalciferol (VITAMIN D3) 2000 UNITS capsule, Take 2,000 Units by mouth daily., Disp: , Rfl:  .  Cyanocobalamin (VITAMIN B 12 PO), Take 600 mg by mouth daily., Disp: , Rfl:  .  glimepiride (AMARYL) 4 MG tablet, TAKE ONE TABLET EVERY MORNING, Disp: 30 tablet, Rfl: 12 .  losartan-hydrochlorothiazide (HYZAAR) 100-25 MG tablet, TAKE ONE (1) TABLET EACH DAY, Disp: 30 tablet, Rfl: 12 .  lovastatin (MEVACOR) 40 MG tablet, TAKE ONE (1) TABLET AT BEDTIME, Disp: 30 tablet, Rfl: 12 .  Omega-3 Fatty Acids (FISH OIL BURP-LESS) 1200 MG CAPS, Take 1 capsule by mouth daily., Disp: , Rfl:  .  pioglitazone (ACTOS) 30 MG tablet, Take 1 tablet (30 mg total) by mouth daily., Disp: 30 tablet,  Rfl: 3  Review of Systems  Constitutional: Negative for appetite change, chills, fatigue and fever.  HENT: Negative for congestion, rhinorrhea and sore throat.   Respiratory: Negative for cough, chest tightness and shortness of breath.   Cardiovascular: Negative for chest pain and palpitations.  Gastrointestinal: Negative for abdominal pain, anorexia, diarrhea, nausea and vomiting.  Musculoskeletal: Negative for joint pain.  Skin: Positive for rash.  Neurological: Negative for dizziness and weakness.    Social History  Substance Use Topics  . Smoking status: Former Smoker    Packs/day: 2.00    Years: 20.00    Types: Cigarettes  . Smokeless tobacco: Never Used     Comment: smoked about 2 packs per day, smoked for about 20 years; Quit in 1980  . Alcohol use 0.0 oz/week     Comment: occasional   Objective:   BP (!) 150/64 (BP Location: Right Arm, Patient Position: Sitting, Cuff Size: Normal)   Pulse 78   Temp 98.1 F (36.7 C) (Oral)   Resp 16   Wt 171 lb (77.6 kg)   SpO2 98% Comment: room air  BMI 26.00 kg/m  Vitals:   07/04/16 1012  BP: (!) 150/64  Pulse: 78  Resp: 16  Temp: 98.1 F (36.7 C)  TempSrc: Oral  SpO2: 98%  Weight: 171 lb (77.6 kg)     Physical Exam  Faint red patch  left of midline of back with a few small vesicals at L1 dermatomoe.     Assessment & Plan:     1. Herpes zoster without complication  - valACYclovir (VALTREX) 1000 MG tablet; Take 1 tablet (1,000 mg total) by mouth every 8 (eight) hours.  Dispense: 21 tablet; Refill: 0  Call if symptoms change or if not rapidly improving.     The entirety of the information documented in the History of Present Illness, Review of Systems and Physical Exam were personally obtained by me. Portions of this information were initially documented by Meyer Cory, CMA and reviewed by me for thoroughness and accuracy.        Lelon Huh, MD  [facility]

## 2016-07-04 NOTE — Patient Instructions (Signed)
Try applying OTC Zostrix cream to alleviate pain from shingles rash

## 2016-07-04 NOTE — Telephone Encounter (Signed)
Triaged Rash for possible shingles.Per patient the rash is located on her waist and back. She states is really painful and red.It first appeared like a "pimple' and some burning on the area on Friday and now she has more.She reports that her daughter told her it was "Shingles". Sometimes it itches's and she has not been able to sleep.Patient was scheduled to come in at 10:45 am.  Thanks,  -Milo Schreier

## 2016-07-22 ENCOUNTER — Other Ambulatory Visit: Payer: Self-pay | Admitting: Family Medicine

## 2016-08-09 ENCOUNTER — Other Ambulatory Visit: Payer: Self-pay | Admitting: Family Medicine

## 2016-08-17 ENCOUNTER — Encounter: Payer: Self-pay | Admitting: Family Medicine

## 2016-08-23 ENCOUNTER — Other Ambulatory Visit: Payer: Self-pay | Admitting: Family Medicine

## 2016-08-31 DIAGNOSIS — E113293 Type 2 diabetes mellitus with mild nonproliferative diabetic retinopathy without macular edema, bilateral: Secondary | ICD-10-CM | POA: Diagnosis not present

## 2016-08-31 LAB — HM DIABETES EYE EXAM

## 2016-09-01 ENCOUNTER — Encounter: Payer: Self-pay | Admitting: *Deleted

## 2016-09-29 ENCOUNTER — Encounter: Payer: Self-pay | Admitting: Family Medicine

## 2016-09-29 ENCOUNTER — Ambulatory Visit (INDEPENDENT_AMBULATORY_CARE_PROVIDER_SITE_OTHER): Payer: Medicare Other | Admitting: Family Medicine

## 2016-09-29 VITALS — BP 122/68 | HR 68 | Temp 98.0°F | Resp 16 | Ht 67.0 in | Wt 169.0 lb

## 2016-09-29 DIAGNOSIS — I10 Essential (primary) hypertension: Secondary | ICD-10-CM

## 2016-09-29 DIAGNOSIS — E1122 Type 2 diabetes mellitus with diabetic chronic kidney disease: Secondary | ICD-10-CM

## 2016-09-29 DIAGNOSIS — Z23 Encounter for immunization: Secondary | ICD-10-CM

## 2016-09-29 DIAGNOSIS — N183 Chronic kidney disease, stage 3 (moderate): Secondary | ICD-10-CM | POA: Diagnosis not present

## 2016-09-29 DIAGNOSIS — R6889 Other general symptoms and signs: Secondary | ICD-10-CM | POA: Diagnosis not present

## 2016-09-29 LAB — POCT GLYCOSYLATED HEMOGLOBIN (HGB A1C)
Est. average glucose Bld gHb Est-mCnc: 157
Hemoglobin A1C: 7.1

## 2016-09-29 NOTE — Progress Notes (Signed)
Patient: Rachel Lester Female    DOB: [birth year]   76 y.o.   MRN: [id number] Visit Date: 09/29/2016  Today's Provider: Lelon Huh, MD   Chief Complaint  Patient presents with  . Diabetes  . Hypertension   Subjective:    HPI  Diabetes Mellitus Type II, Follow-up:   Lab Results  Component Value Date   HGBA1C 7.1 09/29/2016   HGBA1C 8.5 06/28/2016   HGBA1C 7.4 (H) 01/12/2015    Last seen for diabetes 3 months ago.  Management since then includes no changes. She reports good compliance with treatment. She is not having side effects.  Current symptoms include none and have been stable. Home blood sugar records: fasting range: 160 this morning  Episodes of hypoglycemia? no   Current Insulin Regimen: none   Most Recent Eye Exam: up date ( 07/2016) Weight trend: stable Prior visit with dietician: no Current diet: well balanced Current exercise: yard work.   Pertinent Labs:    Component Value Date/Time   CHOL 168 03/29/2016 1006   TRIG 165 (H) 03/29/2016 1006   HDL 44 03/29/2016 1006   LDLCALC 91 03/29/2016 1006   CREATININE 1.47 (H) 03/29/2016 1006   CREATININE 1.23 05/12/2013 0803    Wt Readings from Last 3 Encounters:  09/29/16 169 lb (76.7 kg)  07/04/16 171 lb (77.6 kg)  06/28/16 172 lb (78 kg)     Hypertension, follow-up:  BP Readings from Last 3 Encounters:  09/29/16 122/68  07/04/16 (!) 150/64  06/28/16 130/64    She was last seen for hypertension 6 months ago.  BP at that visit was 164/72. Management since that visit includes no changes. She reports good compliance with treatment. She is not having side effects.  She is exercising. She is adherent to low salt diet.   Outside blood pressures are checked occasionally. She is experiencing none.  Patient denies exertional chest pressure/discomfort, lower extremity edema and palpitations.   Cardiovascular risk factors include diabetes mellitus.     Weight trend: stable Wt Readings  from Last 3 Encounters:  09/29/16 169 lb (76.7 kg)  07/04/16 171 lb (77.6 kg)  06/28/16 172 lb (78 kg)    Current diet: well balanced    She had some screening down by Dr. 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      Allergies  Allergen Reactions  . Metformin Hcl     Weight Loss     Current Outpatient Prescriptions:  .  ACCU-CHEK AVIVA PLUS test strip, CHECK BLOOD SUGAR THREE  TIMES DAILY, Disp: 100 each, Rfl: 4 .  aspirin (ASPIRIN ADULT LOW STRENGTH) 81 MG EC tablet, Take 81 mg by mouth daily. Swallow whole., Disp: , Rfl:  .  b complex vitamins tablet, Take 1 tablet by mouth daily., Disp: , Rfl:  .  Biotin 7500 MCG TABS, Take by mouth., Disp: , Rfl:  .  Cholecalciferol (VITAMIN D3) 2000 UNITS capsule, Take 2,000 Units by mouth daily., Disp: , Rfl:  .  Cyanocobalamin (VITAMIN B 12 PO), Take 600 mg by mouth daily., Disp: , Rfl:  .  glimepiride (AMARYL) 4 MG tablet, TAKE ONE TABLET EVERY MORNING, Disp: 30 tablet, Rfl: 12 .  losartan-hydrochlorothiazide (HYZAAR) 100-25 MG tablet, TAKE ONE (1) TABLET EACH DAY, Disp: 30 tablet, Rfl: 12 .  lovastatin (MEVACOR) 40 MG tablet, TAKE ONE (1) TABLET AT BEDTIME, Disp: 30 tablet, Rfl: 12 .  Omega-3 Fatty Acids (FISH OIL BURP-LESS)  1200 MG CAPS, Take 1 capsule by mouth daily., Disp: , Rfl:  .  pioglitazone (ACTOS) 30 MG tablet, TAKE ONE (1) TABLET EACH DAY, Disp: 30 tablet, Rfl: 12  Review of Systems  Constitutional: Negative.  Negative for appetite change, chills, fatigue and fever.  Respiratory: Negative.  Negative for chest tightness and shortness of breath.   Cardiovascular: Negative.  Negative for chest pain and palpitations.  Gastrointestinal: Negative for abdominal pain, nausea and vomiting.  Musculoskeletal: Negative.   Neurological: Negative.  Negative for dizziness and weakness.    Social History  Substance Use Topics  . Smoking status: Former Smoker    Packs/day: 2.00    Years: 20.00      Types: Cigarettes  . Smokeless tobacco: Never Used     Comment: smoked about 2 packs per day, smoked for about 20 years; Quit in 1980  . Alcohol use 0.0 oz/week     Comment: occasional   Objective:   BP 122/68 (BP Location: Right Arm, Patient Position: Sitting, Cuff Size: Normal)   Pulse 68   Temp 98 F (36.7 C)   Resp 16   Ht 5\' 7"  (1.702 m)   Wt 169 lb (76.7 kg)   BMI 26.47 kg/m  Vitals:   09/29/16 0816  BP: 122/68  Pulse: 68  Resp: 16  Temp: 98 F (36.7 C)  Weight: 169 lb (76.7 kg)  Height: 5\' 7"  (1.702 m)     Physical Exam   General Appearance:    Alert, cooperative, no distress  Eyes:    PERRL, conjunctiva/corneas clear, EOM's intact       Lungs:     Clear to auscultation bilaterally, respirations unlabored  Heart:    Regular rate and rhythm  Neurologic:   Awake, alert, oriented x 3. No apparent focal neurological           defect.       Results for orders placed or performed in visit on 09/29/16  POCT glycosylated hemoglobin (Hb A1C)  Result Value Ref Range   Hemoglobin A1C 7.1    Est. average glucose Bld gHb Est-mCnc 157        Assessment & Plan:     1. Essential hypertension Well controlled.  Continue current medications.    2. Type 2 diabetes mellitus with stage 3 chronic kidney disease, without long-term current use of insulin (HCC) Well controlled.  Continue current medications.   - POCT glycosylated hemoglobin (Hb A1C)  3. Need for prophylactic vaccination using tetanus and diphtheria toxoids adsorbed (Td) vaccine Tdap  4. Abnormal ankle brachial index (ABI) Asymptomatic. Continue daily ASA. Regular foot examinations. Continue daily ECASA and statin.        Lelon Huh, MD  [facility]

## 2016-11-16 ENCOUNTER — Ambulatory Visit (INDEPENDENT_AMBULATORY_CARE_PROVIDER_SITE_OTHER): Payer: Medicare Other

## 2016-11-16 VITALS — BP 140/68 | HR 80 | Temp 99.1°F | Ht 67.0 in | Wt 164.6 lb

## 2016-11-16 DIAGNOSIS — Z0000 Encounter for general adult medical examination without abnormal findings: Secondary | ICD-10-CM

## 2016-11-16 DIAGNOSIS — H1132 Conjunctival hemorrhage, left eye: Secondary | ICD-10-CM | POA: Diagnosis not present

## 2016-11-16 NOTE — Progress Notes (Signed)
Subjective:   Rachel Lester is a 76 y.o. female who presents for Medicare Annual (Subsequent) preventive examination.  Review of Systems:  N/A  Cardiac Risk Factors include: advanced age (>38men, >2 women);diabetes mellitus;dyslipidemia;hypertension     Objective:     Vitals: BP 140/68 (BP Location: Left Arm)   Pulse 80   Temp 99.1 F (37.3 C) (Oral)   Ht 5\' 7"  (1.702 m)   Wt 164 lb 9.6 oz (74.7 kg)   BMI 25.78 kg/m   Body mass index is 25.78 kg/m.   Tobacco History  Smoking Status  . Former Smoker  . Packs/day: 2.00  . Years: 20.00  . Types: Cigarettes  . Quit date: 1980  Smokeless Tobacco  . Never Used    Comment: smoked about 2 packs per day, smoked for about 20 years; Quit in 1980     Counseling given: Not Answered   Past Medical History:  Diagnosis Date  . Diabetes mellitus without complication ([place])    type 2  . Hyperlipidemia   . Hypertension    History reviewed. No pertinent surgical history. Family History  Problem Relation Age of Onset  . Alzheimer's disease Mother   . Coronary artery disease Father   . Arthritis Sister        Lupus  . Diabetes Brother        Type 2  . Diabetes Sister        Type 2   History  Sexual Activity  . Sexual activity: Not on file    Outpatient Encounter Prescriptions as of 11/16/2016  Medication Sig  . ACCU-CHEK AVIVA PLUS test strip CHECK BLOOD SUGAR THREE  TIMES DAILY  . aspirin (ASPIRIN ADULT LOW STRENGTH) 81 MG EC tablet Take 81 mg by mouth daily. Swallow whole.  . b complex vitamins tablet Take 1 tablet by mouth daily.  . Biotin 7500 MCG TABS Take by mouth.  . Cholecalciferol (VITAMIN D3) 2000 UNITS capsule Take 2,000 Units by mouth daily.  . Cyanocobalamin (VITAMIN B 12 PO) Take 600 mg by mouth daily.  Marland Kitchen glimepiride (AMARYL) 4 MG tablet TAKE ONE TABLET EVERY MORNING  . losartan-hydrochlorothiazide (HYZAAR) 100-25 MG tablet TAKE ONE (1) TABLET EACH DAY  . lovastatin (MEVACOR) 40 MG tablet TAKE ONE  (1) TABLET AT BEDTIME  . Omega-3 Fatty Acids (FISH OIL BURP-LESS) 1200 MG CAPS Take 1 capsule by mouth daily.  . pioglitazone (ACTOS) 30 MG tablet TAKE ONE (1) TABLET EACH DAY   No facility-administered encounter medications on file as of 11/16/2016.     Activities of Daily Living In your present state of health, do you have any difficulty performing the following activities: 11/16/2016 03/29/2016  Hearing? N N  Vision? N N  Difficulty concentrating or making decisions? N N  Walking or climbing stairs? N N  Dressing or bathing? N N  Doing errands, shopping? N N  Preparing Food and eating ? N -  Using the Toilet? N -  In the past six months, have you accidently leaked urine? N -  Do you have problems with loss of bowel control? N -  Managing your Medications? N -  Managing your Finances? N -  Housekeeping or managing your Housekeeping? N -  Some recent data might be hidden    Patient Care Team: Birdie Sons, MD as PCP - General (Family Medicine) Birder Robson, MD as Referring Physician (Ophthalmology)    Assessment:     Exercise Activities and Dietary recommendations Current Exercise  Habits: Home exercise routine (gardening), Type of exercise: treadmill, Time (Minutes): 10, Frequency (Times/Week): 2, Weekly Exercise (Minutes/Week): 20, Intensity: Mild, Exercise limited by: None identified  Goals    . Increase water intake          Recommend increasing water intake to 6 glasses a day.       Fall Risk Fall Risk  11/16/2016 03/29/2016 01/12/2015  Falls in the past year? No No No   Depression Screen PHQ 2/9 Scores 11/16/2016 03/29/2016 01/12/2015  PHQ - 2 Score 0 0 0     Cognitive Function     6CIT Screen 11/16/2016  What Year? 0 points  What month? 0 points  What time? 0 points  Count back from 20 0 points  Months in reverse 0 points  Repeat phrase 0 points  Total Score 0    Immunization History  Administered Date(s) Administered  . Influenza, High Dose  Seasonal PF 01/12/2015, 03/29/2016  . Pneumococcal Conjugate-13 10/07/2013  . Pneumococcal Polysaccharide-23 02/17/2004, 02/09/2011  . Td 09/01/2006  . Tdap 09/29/2016  . Zoster 04/09/2009   Screening Tests Health Maintenance  Topic Date Due  . INFLUENZA VACCINE  12/17/2016 (Originally 11/16/2016)  . HEMOGLOBIN A1C  03/31/2017  . OPHTHALMOLOGY EXAM  08/31/2017  . FOOT EXAM  09/29/2017  . COLONOSCOPY  06/23/2019  . TETANUS/TDAP  09/30/2026  . DEXA SCAN  Completed  . PNA vac Low Risk Adult  Completed      Plan:  I have personally reviewed and addressed the Medicare Annual Wellness questionnaire and have noted the following in the patient's chart:  A. Medical and social history B. Use of alcohol, tobacco or illicit drugs  C. Current medications and supplements D. Functional ability and status E.  Nutritional status F.  Physical activity G. Advance directives H. List of other physicians I.  Hospitalizations, surgeries, and ER visits in previous 12 months J.  [place] such as hearing and vision if needed, cognitive and depression L. Referrals and appointments - none  In addition, I have reviewed and discussed with patient certain preventive protocols, quality metrics, and best practice recommendations. A written personalized care plan for preventive services as well as general preventive health recommendations were provided to patient.  See attached scanned questionnaire for additional information.   Signed,  Fabio Neighbors, LPN Nurse Health Advisor   MD Recommendations:

## 2016-11-16 NOTE — Patient Instructions (Signed)
[name] , Thank you for taking time to come for your Medicare Wellness Visit. I appreciate your ongoing commitment to your health goals. Please review the following plan we discussed and let me know if I can assist you in the future.   Screening recommendations/referrals: Colonoscopy: completed 06/22/09 Mammogram: completed 04/07/16 Bone Density: completed 05/19/11 Recommended yearly ophthalmology/optometry visit for glaucoma screening and checkup Recommended yearly dental visit for hygiene and checkup  Vaccinations: Influenza vaccine: due 12/2016 Pneumococcal vaccine: completed series Tdap vaccine: completed 09/29/16, due 09/2026 Shingles vaccine: completed 04/09/09    Advanced directives: Please bring a copy of your POA (Power of [place]) and/or Living Will to your next appointment.   Conditions/risks identified: Recommend increasing water intake to 6 glasses a day.   Next Appointment: 01/31/17   Preventive Care 65 Years and Older, Female Preventive care refers to lifestyle choices and visits with your health care provider that can promote health and wellness. What does preventive care include?  A yearly physical exam. This is also called an annual well check.  Dental exams once or twice a year.  Routine eye exams. Ask your health care provider how often you should have your eyes checked.  Personal lifestyle choices, including:  Daily care of your teeth and gums.  Regular physical activity.  Eating a healthy diet.  Avoiding tobacco and drug use.  Limiting alcohol use.  Practicing safe sex.  Taking low-dose aspirin every day.  Taking vitamin and mineral supplements as recommended by your health care provider. What happens during an annual well check? The services and screenings done by your health care provider during your annual well check will depend on your age, overall health, lifestyle risk factors, and family history of disease. Counseling  Your health  care provider may ask you questions about your:  Alcohol use.  Tobacco use.  Drug use.  Emotional well-being.  Home and relationship well-being.  Sexual activity.  Eating habits.  History of falls.  Memory and ability to understand (cognition).  Work and work Statistician.  Reproductive health. Screening  You may have the following tests or measurements:  Height, weight, and BMI.  Blood pressure.  Lipid and cholesterol levels. These may be checked every 5 years, or more frequently if you are over 94 years old.  Skin check.  Lung cancer screening. You may have this screening every year starting at age 25 if you have a 30-pack-year history of smoking and currently smoke or have quit within the past 15 years.  Fecal occult blood test (FOBT) of the stool. You may have this test every year starting at age 81.  Flexible sigmoidoscopy or colonoscopy. You may have a sigmoidoscopy every 5 years or a colonoscopy every 10 years starting at age 18.  Hepatitis C blood test.  Hepatitis B blood test.  Sexually transmitted disease (STD) testing.  Diabetes screening. This is done by checking your blood sugar (glucose) after you have not eaten for a while (fasting). You may have this done every 1-3 years.  Bone density scan. This is done to screen for osteoporosis. You may have this done starting at age 66.  Mammogram. This may be done every 1-2 years. Talk to your health care provider about how often you should have regular mammograms. Talk with your health care provider about your test results, treatment options, and if necessary, the need for more tests. Vaccines  Your health care provider may recommend certain vaccines, such as:  Influenza vaccine. This is recommended every year.  Tetanus, diphtheria, and acellular pertussis (Tdap, Td) vaccine. You may need a Td booster every 10 years.  Zoster vaccine. You may need this after age 66.  Pneumococcal 13-valent conjugate  (PCV13) vaccine. One dose is recommended after age 57.  Pneumococcal polysaccharide (PPSV23) vaccine. One dose is recommended after age 36. Talk to your health care provider about which screenings and vaccines you need and how often you need them. This information is not intended to replace advice given to you by your health care provider. Make sure you discuss any questions you have with your health care provider. Document Released: 05/01/2015 Document Revised: 12/23/2015 Document Reviewed: 02/03/2015 Elsevier Interactive Patient Education  2017 [place] Prevention in the Home Falls can cause injuries. They can happen to people of all ages. There are many things you can do to make your home safe and to help prevent falls. What can I do on the outside of my home?  Regularly fix the edges of walkways and driveways and fix any cracks.  Remove anything that might make you trip as you walk through a door, such as a raised step or threshold.  Trim any bushes or trees on the path to your home.  Use bright outdoor lighting.  Clear any walking paths of anything that might make someone trip, such as rocks or tools.  Regularly check to see if handrails are loose or broken. Make sure that both sides of any steps have handrails.  Any raised decks and porches should have guardrails on the edges.  Have any leaves, snow, or ice cleared regularly.  Use sand or salt on walking paths during winter.  Clean up any spills in your garage right away. This includes oil or grease spills. What can I do in the bathroom?  Use night lights.  Install grab bars by the toilet and in the tub and shower. Do not use towel bars as grab bars.  Use non-skid mats or decals in the tub or shower.  If you need to sit down in the shower, use a plastic, non-slip stool.  Keep the floor dry. Clean up any water that spills on the floor as soon as it happens.  Remove soap buildup in the tub or shower  regularly.  Attach bath mats securely with double-sided non-slip rug tape.  Do not have throw rugs and other things on the floor that can make you trip. What can I do in the bedroom?  Use night lights.  Make sure that you have a light by your bed that is easy to reach.  Do not use any sheets or blankets that are too big for your bed. They should not hang down onto the floor.  Have a firm chair that has side arms. You can use this for support while you get dressed.  Do not have throw rugs and other things on the floor that can make you trip. What can I do in the kitchen?  Clean up any spills right away.  Avoid walking on wet floors.  Keep items that you use a lot in easy-to-reach places.  If you need to reach something above you, use a strong step stool that has a grab bar.  Keep electrical cords out of the way.  Do not use floor polish or wax that makes floors slippery. If you must use wax, use non-skid floor wax.  Do not have throw rugs and other things on the floor that can make you trip. What can I do  with my stairs?  Do not leave any items on the stairs.  Make sure that there are handrails on both sides of the stairs and use them. Fix handrails that are broken or loose. Make sure that handrails are as long as the stairways.  Check any carpeting to make sure that it is firmly attached to the stairs. Fix any carpet that is loose or worn.  Avoid having throw rugs at the top or bottom of the stairs. If you do have throw rugs, attach them to the floor with carpet tape.  Make sure that you have a light switch at the top of the stairs and the bottom of the stairs. If you do not have them, ask someone to add them for you. What else can I do to help prevent falls?  Wear shoes that:  Do not have high heels.  Have rubber bottoms.  Are comfortable and fit you well.  Are closed at the toe. Do not wear sandals.  If you use a stepladder:  Make sure that it is fully  opened. Do not climb a closed stepladder.  Make sure that both sides of the stepladder are locked into place.  Ask someone to hold it for you, if possible.  Clearly mark and make sure that you can see:  Any grab bars or handrails.  First and last steps.  Where the edge of each step is.  Use tools that help you move around (mobility aids) if they are needed. These include:  Canes.  Walkers.  Scooters.  Crutches.  Turn on the lights when you go into a dark area. Replace any light bulbs as soon as they burn out.  Set up your furniture so you have a clear path. Avoid moving your furniture around.  If any of your floors are uneven, fix them.  If there are any pets around you, be aware of where they are.  Review your medicines with your doctor. Some medicines can make you feel dizzy. This can increase your chance of falling. Ask your doctor what other things that you can do to help prevent falls. This information is not intended to replace advice given to you by your health care provider. Make sure you discuss any questions you have with your health care provider. Document Released: 01/29/2009 Document Revised: 09/10/2015 Document Reviewed: 05/09/2014 Elsevier Interactive Patient Education  2017 [employer].

## 2016-11-24 ENCOUNTER — Ambulatory Visit (INDEPENDENT_AMBULATORY_CARE_PROVIDER_SITE_OTHER): Payer: Medicare Other | Admitting: Family Medicine

## 2016-11-24 ENCOUNTER — Encounter: Payer: Self-pay | Admitting: Family Medicine

## 2016-11-24 VITALS — BP 120/60 | HR 72 | Temp 98.9°F | Resp 16 | Wt 165.0 lb

## 2016-11-24 DIAGNOSIS — W57XXXA Bitten or stung by nonvenomous insect and other nonvenomous arthropods, initial encounter: Secondary | ICD-10-CM | POA: Diagnosis not present

## 2016-11-24 DIAGNOSIS — L0390 Cellulitis, unspecified: Secondary | ICD-10-CM

## 2016-11-24 MED ORDER — CEPHALEXIN 500 MG PO CAPS: 500.0000 mg | ORAL_CAPSULE | Freq: Three times a day (TID) | ORAL | 0 refills | Status: AC

## 2016-11-24 MED ORDER — PREDNISONE 20 MG PO TABS: 20.0000 mg | ORAL_TABLET | Freq: Every day | ORAL | 0 refills | Status: AC

## 2016-11-24 NOTE — Patient Instructions (Signed)
Bee, Wasp, or [employer], Adult Bees, wasps, and hornets are part of a family of insects that can sting people. These stings can cause pain and inflammation, but they are usually not serious. However, some people may have an allergic reaction to a sting. This can cause the symptoms to be more severe. What increases the risk? You may be at a greater risk of getting stung if you:  Provoke a stinging insect by swatting or disturbing it.  Wear strong-smelling soaps, deodorants, or body sprays.  Spend time outdoors near gardens with flowers or fruit trees or in clothes that expose skin.  Eat or drink outside.  What are the signs or symptoms? Common symptoms of this condition include:  A red lump in the skin that sometimes has a tiny hole in the center. In some cases, a stinger may be in the center of the wound.  Pain and itching at the sting site.  Redness and swelling around the sting site. If you have an allergic reaction (localized allergic reaction), the swelling and redness may spread out from the sting site. In some cases, this reaction can continue to develop over the next 24-48 hours.  In rare cases, a person may have a severe allergic reaction (anaphylactic reaction) to a sting. Symptoms of an anaphylactic reaction may include:  Wheezing or difficulty breathing.  Raised, itchy, red patches on the skin (hives).  Nausea or vomiting.  Abdominal cramping.  Diarrhea.  Tightness in the chest or chest pain.  Dizziness or fainting.  Redness of the face (flushing).  Hoarse voice.  Swollen tongue, lips, or face.  How is this diagnosed? This condition is usually diagnosed based on your symptoms and medical history as well as a physical exam. You may have an allergy test to determine if you are allergic to the substance that the insect injected during the sting (venom). How is this treated? If you were stung by a bee, the stinger and a small sac of venom may be in the wound.  It is important to remove the stinger as soon as possible. You can do this by brushing across the wound with gauze, a fingernail, or a flat card such as a credit card. Removing the stinger can help reduce the severity of your body's reaction to the sting. Most stings can be treated with:  Icing to reduce swelling in the area.  Medicines (antihistamines) to treat itching or an allergic reaction.  Medicines to help reduce pain. These may be medicines that you take by mouth, or medicated creams or lotions that you apply to your skin.  Pay close attention to your symptoms after you have been stung. If possible, have someone stay with you to make sure you do not have an allergic reaction. If you have any signs of an allergic reaction, call your health care provider. If you have ever had a severe allergic reaction, your health care provider may give you an inhaler or injectable medicine (epinephrine auto-injector) to use if necessary. Follow these instructions at home:  Wash the sting site 2-3 times each day with soap and water as told by your health care provider.  Apply or take over-the-counter and prescription medicines only as told by your health care provider.  If directed, apply ice to the sting area. ? Put ice in a plastic bag. ? Place a towel between your skin and the bag. ? Leave the ice on for 20 minutes, 2-3 times a day.  Do not scratch the sting  area.  If you had a severe allergic reaction to a sting, you may need: ? To wear a medical bracelet or necklace that lists the allergy. ? To learn when and how to use an anaphylaxis kit or epinephrine injection. Your family members and coworkers may also need to learn this. ? To carry an anaphylaxis kit or epinephrine injection with you at all times. How is this prevented?  Avoid swatting at stinging insects and disturbing insect nests.  Do not use fragrant soaps or lotions.  Wear shoes, pants, and long sleeves when spending time  outdoors, especially in grassy areas where stinging insects are common.  Keep outdoor areas free from nests or hives.  Keep food and drink containers covered when eating outdoors.  Avoid working or sitting near flowering plants, if possible.  Wear gloves if you are gardening or working outdoors.  If an attack by a stinging insect or a swarm seems likely in the moment, move away from the area or find a barrier between you and the insect(s), such as a door. Contact a health care provider if:  Your symptoms do not get better in 2-3 days.  You have redness, swelling, or pain that spreads beyond the area of the sting.  You have a fever. Get help right away if: You have symptoms of a severe allergic reaction. These include:  Wheezing or difficulty breathing.  Tightness in the chest or chest pain.  Light-headedness or fainting.  Itchy, raised, red patches on the skin.  Nausea or vomiting.  Abdominal cramping.  Diarrhea.  A swollen tongue or lips, or trouble swallowing.  Dizziness or fainting.  Summary  Stings from bees, wasps, and hornets can cause pain and inflammation, but they are usually not serious. However, some people may have an allergic reaction to a sting. This can cause the symptoms to be more severe.  Pay close attention to your symptoms after you have been stung. If possible, have someone stay with you to make sure you do not have an allergic reaction.  Call your health care provider if you have any signs of an allergic reaction. This information is not intended to replace advice given to you by your health care provider. Make sure you discuss any questions you have with your health care provider. Document Released: 04/04/2005 Document Revised: 06/09/2016 Document Reviewed: 06/09/2016 Elsevier Interactive Patient Education  2018 [employer] Inc[employer]  

## 2016-11-24 NOTE — Progress Notes (Signed)
Patient: Rachel Lester Female    DOB: [birth year]   76 y.o.   MRN: [id number] Visit Date: 11/24/2016  Today's Provider: Lelon Huh, MD   Chief Complaint  Patient presents with  . Insect Bite    1 day ago   Subjective:    HPI Bee sting:  Patient reports that she was stung by a yellow jacket yesterday on the side of her left hand yesterday. Since then she has had redness, swelling and itching of the left hand and lower left arm. Patient has been taking Benadryl to help with swelling.     Allergies  Allergen Reactions  . Metformin Hcl     Weight Loss     Current Outpatient Prescriptions:  .  ACCU-CHEK AVIVA PLUS test strip, CHECK BLOOD SUGAR THREE  TIMES DAILY, Disp: 100 each, Rfl: 4 .  aspirin (ASPIRIN ADULT LOW STRENGTH) 81 MG EC tablet, Take 81 mg by mouth daily. Swallow whole., Disp: , Rfl:  .  b complex vitamins tablet, Take 1 tablet by mouth daily., Disp: , Rfl:  .  Biotin 7500 MCG TABS, Take by mouth., Disp: , Rfl:  .  Cholecalciferol (VITAMIN D3) 2000 UNITS capsule, Take 2,000 Units by mouth daily., Disp: , Rfl:  .  Cyanocobalamin (VITAMIN B 12 PO), Take 600 mg by mouth daily., Disp: , Rfl:  .  glimepiride (AMARYL) 4 MG tablet, TAKE ONE TABLET EVERY MORNING, Disp: 30 tablet, Rfl: 12 .  losartan-hydrochlorothiazide (HYZAAR) 100-25 MG tablet, TAKE ONE (1) TABLET EACH DAY, Disp: 30 tablet, Rfl: 12 .  lovastatin (MEVACOR) 40 MG tablet, TAKE ONE (1) TABLET AT BEDTIME, Disp: 30 tablet, Rfl: 12 .  Omega-3 Fatty Acids (FISH OIL BURP-LESS) 1200 MG CAPS, Take 1 capsule by mouth daily., Disp: , Rfl:  .  pioglitazone (ACTOS) 30 MG tablet, TAKE ONE (1) TABLET EACH DAY, Disp: 30 tablet, Rfl: 12  Review of Systems  Constitutional: Negative for appetite change, chills, fatigue and fever.  Respiratory: Negative for chest tightness and shortness of breath.   Cardiovascular: Negative for chest pain and palpitations.  Gastrointestinal: Negative for abdominal pain, nausea and  vomiting.  Skin: Positive for color change.       itching if the skin and swelling of the lower right arm and hand  Neurological: Negative for dizziness and weakness.    Social History  Substance Use Topics  . Smoking status: Former Smoker    Packs/day: 2.00    Years: 20.00    Types: Cigarettes    Quit date: 58  . Smokeless tobacco: Never Used     Comment: smoked about 2 packs per day, smoked for about 20 years; Quit in 1980  . Alcohol use 0.0 oz/week     Comment: occasional- 1/month (wine)   Objective:   BP 120/60 (BP Location: Left Arm, Patient Position: Sitting, Cuff Size: Large)   Pulse 72   Temp 98.9 F (37.2 C) (Oral)   Resp 16   Wt 165 lb (74.8 kg)   SpO2 99% Comment: room air  BMI 25.84 kg/m  There were no vitals filed for this visit.   Physical Exam  General appearance: alert, well developed, well nourished, cooperative and in no distress Head: Normocephalic, without obvious abnormality, atraumatic Respiratory: Respirations even and unlabored, normal respiratory rate Extremities: Dorsum of left hand and forearm swollen with faint, but well demarcated erythema. Slightly tender to touch.      Assessment & Plan:  1. Insect bite, initial encounter - predniSONE (DELTASONE) 20 MG tablet; Take 1 tablet (20 mg total) by mouth daily with breakfast.  Dispense: 5 tablet; Refill: 0  2. Cellulitis, unspecified cellulitis site Likely inflammatory reaction to sting, but cannot rule out infection. Will cover with brief course of antibiotic.  - cephALEXin (KEFLEX) 500 MG capsule; Take 1 capsule (500 mg total) by mouth 3 (three) times daily.  Dispense: 15 capsule; Refill: 0  Care instruction printed for sting.        Lelon Huh, MD  [facility]

## 2017-01-31 ENCOUNTER — Encounter: Payer: Self-pay | Admitting: Family Medicine

## 2017-01-31 ENCOUNTER — Ambulatory Visit (INDEPENDENT_AMBULATORY_CARE_PROVIDER_SITE_OTHER): Payer: Medicare Other | Admitting: Family Medicine

## 2017-01-31 VITALS — BP 142/60 | HR 72 | Temp 98.6°F | Resp 16 | Ht 67.0 in | Wt 160.0 lb

## 2017-01-31 DIAGNOSIS — I10 Essential (primary) hypertension: Secondary | ICD-10-CM

## 2017-01-31 DIAGNOSIS — N183 Chronic kidney disease, stage 3 (moderate): Secondary | ICD-10-CM

## 2017-01-31 DIAGNOSIS — E1122 Type 2 diabetes mellitus with diabetic chronic kidney disease: Secondary | ICD-10-CM

## 2017-01-31 DIAGNOSIS — Z23 Encounter for immunization: Secondary | ICD-10-CM

## 2017-01-31 LAB — POCT GLYCOSYLATED HEMOGLOBIN (HGB A1C)
Est. average glucose Bld gHb Est-mCnc: 160
HEMOGLOBIN A1C: 7.2

## 2017-01-31 LAB — POCT UA - MICROALBUMIN: MICROALBUMIN (UR) POC: 20 mg/L

## 2017-01-31 MED ORDER — VALSARTAN-HYDROCHLOROTHIAZIDE 160-25 MG PO TABS: 1.0000 | ORAL_TABLET | Freq: Every day | ORAL | 3 refills | Status: DC

## 2017-01-31 NOTE — Patient Instructions (Signed)
  Medications Discontinued During This Encounter  Medication Reason  . losartan-hydrochlorothiazide (HYZAAR) 100-25 MG tablet     Meds ordered this encounter  Medications  . valsartan-hydrochlorothiazide (DIOVAN HCT) 160-25 MG tablet    Sig: Take 1 tablet by mouth daily.    Dispense:  30 tablet    Refill:  3

## 2017-01-31 NOTE — Progress Notes (Signed)
Patient: Rachel Lester Female    DOB: Aug 29, 1940   76 y.o.   MRN: [id number] Visit Date: 01/31/2017  Today's Provider: Lelon Huh, MD   Chief Complaint  Patient presents with  . Hypertension    follow up  . Diabetes    follow up   Subjective:    HPI  Hypertension, follow-up:  BP Readings from Last 3 Encounters:  11/24/16 120/60  11/16/16 140/68  09/29/16 122/68    She was last seen for hypertension 4 months ago.  BP at that visit was 122/68. Management since that visit includes no changes. She reports good compliance with treatment. She is not having side effects.  She is exercising. She is not adherent to low salt diet.   Outside blood pressures are averaging 163/84, however systolics have frequently been in the 150s.  She is experiencing none.  Patient denies chest pain, chest pressure/discomfort, claudication, dyspnea, exertional chest pressure/discomfort, fatigue, irregular heart beat, lower extremity edema, near-syncope, orthopnea, palpitations, paroxysmal nocturnal dyspnea, syncope and tachypnea.   Cardiovascular risk factors include advanced age (older than 61 for men, 34 for women), diabetes mellitus and hypertension.  Use of agents associated with hypertension: NSAIDS.     Weight trend: decreasing steadily Wt Readings from Last 3 Encounters:  11/24/16 165 lb (74.8 kg)  11/16/16 164 lb 9.6 oz (74.7 kg)  09/29/16 169 lb (76.7 kg)    Current diet: well balanced  ------------------------------------------------------------------------  Diabetes Mellitus Type II, Follow-up:   Lab Results  Component Value Date   HGBA1C 7.1 09/29/2016   HGBA1C 8.5 06/28/2016   HGBA1C 7.4 (H) 01/12/2015    Last seen for diabetes 4 months ago.  Management since then includes no changes. She reports good compliance with treatment. She is not having side effects.  Current symptoms include none and have been stable. Home blood sugar records: fasting range:  150 or less  Episodes of hypoglycemia? no   Current Insulin Regimen: none Most Recent Eye Exam: 08/2016 Weight trend: decreasing steadily Prior visit with dietician: no Current diet: well balanced Current exercise: cardiovascular workout on exercise equipment  Pertinent Labs:    Component Value Date/Time   CHOL 168 03/29/2016 1006   TRIG 165 (H) 03/29/2016 1006   HDL 44 03/29/2016 1006   LDLCALC 91 03/29/2016 1006   CREATININE 1.47 (H) 03/29/2016 1006   CREATININE 1.23 05/12/2013 0803    Wt Readings from Last 3 Encounters:  11/24/16 165 lb (74.8 kg)  11/16/16 164 lb 9.6 oz (74.7 kg)  09/29/16 169 lb (76.7 kg)    ------------------------------------------------------------------------     Allergies  Allergen Reactions  . Metformin Hcl     Weight Loss     Current Outpatient Prescriptions:  .  ACCU-CHEK AVIVA PLUS test strip, CHECK BLOOD SUGAR THREE  TIMES DAILY, Disp: 100 each, Rfl: 4 .  aspirin (ASPIRIN ADULT LOW STRENGTH) 81 MG EC tablet, Take 81 mg by mouth daily. Swallow whole., Disp: , Rfl:  .  b complex vitamins tablet, Take 1 tablet by mouth daily., Disp: , Rfl:  .  Biotin 7500 MCG TABS, Take by mouth., Disp: , Rfl:  .  Cholecalciferol (VITAMIN D3) 2000 UNITS capsule, Take 2,000 Units by mouth daily., Disp: , Rfl:  .  Cyanocobalamin (VITAMIN B 12 PO), Take 600 mg by mouth daily., Disp: , Rfl:  .  glimepiride (AMARYL) 4 MG tablet, TAKE ONE TABLET EVERY MORNING, Disp: 30 tablet, Rfl: 12 .  losartan-hydrochlorothiazide (HYZAAR) 100-25  MG tablet, TAKE ONE (1) TABLET EACH DAY, Disp: 30 tablet, Rfl: 12 .  lovastatin (MEVACOR) 40 MG tablet, TAKE ONE (1) TABLET AT BEDTIME, Disp: 30 tablet, Rfl: 12 .  Omega-3 Fatty Acids (FISH OIL BURP-LESS) 1200 MG CAPS, Take 1 capsule by mouth daily., Disp: , Rfl:  .  pioglitazone (ACTOS) 30 MG tablet, TAKE ONE (1) TABLET EACH DAY, Disp: 30 tablet, Rfl: 12  Review of Systems  Constitutional: Negative for appetite change, chills,  fatigue and fever.  Respiratory: Negative for chest tightness and shortness of breath.   Cardiovascular: Negative for chest pain and palpitations.  Gastrointestinal: Negative for abdominal pain, nausea and vomiting.  Neurological: Negative for dizziness and weakness.    Social History  Substance Use Topics  . Smoking status: Former Smoker    Packs/day: 2.00    Years: 20.00    Types: Cigarettes    Quit date: 56  . Smokeless tobacco: Never Used     Comment: smoked about 2 packs per day, smoked for about 20 years; Quit in 1980  . Alcohol use 0.0 oz/week     Comment: occasional- 1/month (wine)   Objective:   BP (!) 142/60 (BP Location: Right Arm, Cuff Size: Normal)   Pulse 72   Temp 98.6 F (37 C) (Oral)   Resp 16   Ht 5\' 7"  (1.702 m)   Wt 160 lb (72.6 kg)   SpO2 99% Comment: room air  BMI 25.06 kg/m  Vitals:   01/31/17 0827 01/31/17 0832  BP: (!) 142/62 (!) 142/60  Pulse: 72   Resp: 16   Temp: 98.6 F (37 C)   TempSrc: Oral   SpO2: 99%   Weight: 160 lb (72.6 kg)   Height: 5\' 7"  (1.702 m)      Physical Exam   General Appearance:    Alert, cooperative, no distress  Eyes:    PERRL, conjunctiva/corneas clear, EOM's intact       Lungs:     Clear to auscultation bilaterally, respirations unlabored  Heart:    Regular rate and rhythm  Neurologic:   Awake, alert, oriented x 3. No apparent focal neurological           defect.         Results for orders placed or performed in visit on 01/31/17  POCT HgB A1C  Result Value Ref Range   Hemoglobin A1C 7.2    Est. average glucose Bld gHb Est-mCnc 160        Assessment & Plan:     1. Type 2 diabetes mellitus with stage 3 chronic kidney disease, without long-term current use of insulin (HCC) Well controlled.  Continue current medications.   - POCT HgB A1C - POCT UA - Microalbumin  2. Need for influenza vaccination  - Flu vaccine HIGH DOSE PF (Fluzone High dose)  3. Essential hypertension Change losartan-hctz  to valsartan-hctz due to consistently elevated SBP.   Return in about 3 months (around 05/03/2017).        Lelon Huh, MD  [facility]

## 2017-03-22 ENCOUNTER — Other Ambulatory Visit: Payer: Self-pay

## 2017-03-22 NOTE — Patient Outreach (Signed)
[facility]) Care Management  03/22/2017  [name] Nov 09, 1940 [id number]   Medication adherence call to Mrs. Rachel Lester patient is showing past due under [facility] Ins.on four of her medication Valsarta/hctz 160/25,Glimepiride 4 mg,Lovastatin 40 mg and Pioglitazone 30 mg spoke with [name] she said she was on vacation and  pick then up from a local pharmacy she mention that beginning of next year she will start receiving it from [facility] mail order,because it will be cheaper for her.   [place] Management Direct Dial [phone]  Fax [phone] [email or web address]

## 2017-04-24 ENCOUNTER — Other Ambulatory Visit: Payer: Self-pay | Admitting: Family Medicine

## 2017-04-24 MED ORDER — LOVASTATIN 40 MG PO TABS: ORAL_TABLET | ORAL | 12 refills | Status: DC

## 2017-04-24 NOTE — Telephone Encounter (Signed)
[facility] pharmacy faxed a refill for the medication. Thanks CC  lovastatin (MEVACOR) 40 MG tablet

## 2017-05-03 ENCOUNTER — Ambulatory Visit (INDEPENDENT_AMBULATORY_CARE_PROVIDER_SITE_OTHER): Payer: Medicare Other | Admitting: Family Medicine

## 2017-05-03 ENCOUNTER — Encounter: Payer: Self-pay | Admitting: Family Medicine

## 2017-05-03 VITALS — BP 144/62 | HR 75 | Temp 97.9°F | Resp 16 | Wt 168.0 lb

## 2017-05-03 DIAGNOSIS — N183 Chronic kidney disease, stage 3 unspecified: Secondary | ICD-10-CM

## 2017-05-03 DIAGNOSIS — I10 Essential (primary) hypertension: Secondary | ICD-10-CM | POA: Diagnosis not present

## 2017-05-03 DIAGNOSIS — E1122 Type 2 diabetes mellitus with diabetic chronic kidney disease: Secondary | ICD-10-CM | POA: Diagnosis not present

## 2017-05-03 LAB — POCT GLYCOSYLATED HEMOGLOBIN (HGB A1C)
Est. average glucose Bld gHb Est-mCnc: 163
Hemoglobin A1C: 7.3

## 2017-05-03 NOTE — Progress Notes (Signed)
Patient: Rachel Lester Female    DOB: 11-20-1940   77 y.o.   MRN: [id number] Visit Date: 05/03/2017  Today's Provider: Lelon Huh, MD   Chief Complaint  Patient presents with  . Diabetes    follow up  . Hypertension    follow up   Subjective:    HPI  Diabetes Mellitus Type II, Follow-up:   Lab Results  Component Value Date   HGBA1C 7.2 01/31/2017   HGBA1C 7.1 09/29/2016   HGBA1C 8.5 06/28/2016    Last seen for diabetes 3 months ago.  Management since then includes no changes. She reports good compliance with treatment. She is not having side effects.  Current symptoms include none and have been stable. Home blood sugar records: fasting range: 150 or less  Episodes of hypoglycemia? no   Current Insulin Regimen: none Most Recent Eye Exam: 1 year ago Weight trend: fluctuating a bit Prior visit with dietician: no Current diet: well balanced Current exercise: cardiovascular workout on exercise equipment  Pertinent Labs:    Component Value Date/Time   CHOL 168 03/29/2016 1006   TRIG 165 (H) 03/29/2016 1006   HDL 44 03/29/2016 1006   LDLCALC 91 03/29/2016 1006   CREATININE 1.47 (H) 03/29/2016 1006   CREATININE 1.23 05/12/2013 0803    Wt Readings from Last 3 Encounters:  01/31/17 160 lb (72.6 kg)  11/24/16 165 lb (74.8 kg)  11/16/16 164 lb 9.6 oz (74.7 kg)    ------------------------------------------------------------------------  Hypertension, follow-up:  BP Readings from Last 3 Encounters:  01/31/17 (!) 142/60  11/24/16 120/60  11/16/16 140/68    She was last seen for hypertension 3 months ago.  BP at that visit was 142/62. Management since that visit includes changing Losartan-HCTZ to Valsartan-HCTZ due to consistently elevated SBP. She reports good compliance with treatment. She is not having side effects.  She is exercising. She is adherent to low salt diet.   Outside blood pressures are not checked. She is experiencing none.    Patient denies chest pain, chest pressure/discomfort, claudication, dyspnea, exertional chest pressure/discomfort, fatigue, irregular heart beat, lower extremity edema, near-syncope, orthopnea, palpitations, paroxysmal nocturnal dyspnea, syncope and tachypnea.   Cardiovascular risk factors include advanced age (older than 44 for men, 25 for women) and diabetes mellitus.  Use of agents associated with hypertension: NSAIDS.     Weight trend: fluctuating a bit Wt Readings from Last 3 Encounters:  01/31/17 160 lb (72.6 kg)  11/24/16 165 lb (74.8 kg)  11/16/16 164 lb 9.6 oz (74.7 kg)    Current diet: well balanced  ------------------------------------------------------------------------     Allergies  Allergen Reactions  . Metformin Hcl     Weight Loss     Current Outpatient Medications:  .  ACCU-CHEK AVIVA PLUS test strip, CHECK BLOOD SUGAR THREE  TIMES DAILY, Disp: 100 each, Rfl: 4 .  aspirin (ASPIRIN ADULT LOW STRENGTH) 81 MG EC tablet, Take 81 mg by mouth daily. Swallow whole., Disp: , Rfl:  .  b complex vitamins tablet, Take 1 tablet by mouth daily., Disp: , Rfl:  .  Biotin 7500 MCG TABS, Take by mouth., Disp: , Rfl:  .  Cholecalciferol (VITAMIN D3) 2000 UNITS capsule, Take 2,000 Units by mouth daily., Disp: , Rfl:  .  Cyanocobalamin (VITAMIN B 12 PO), Take 600 mg by mouth daily., Disp: , Rfl:  .  glimepiride (AMARYL) 4 MG tablet, TAKE ONE TABLET EVERY MORNING, Disp: 30 tablet, Rfl: 12 .  lovastatin (MEVACOR)  40 MG tablet, TAKE ONE (1) TABLET AT BEDTIME, Disp: 30 tablet, Rfl: 12 .  Omega-3 Fatty Acids (FISH OIL BURP-LESS) 1200 MG CAPS, Take 1 capsule by mouth daily., Disp: , Rfl:  .  pioglitazone (ACTOS) 30 MG tablet, TAKE ONE (1) TABLET EACH DAY, Disp: 30 tablet, Rfl: 12 .  valsartan-hydrochlorothiazide (DIOVAN HCT) 160-25 MG tablet, Take 1 tablet by mouth daily., Disp: 30 tablet, Rfl: 3  Review of Systems  Constitutional: Negative for appetite change, chills, fatigue and  fever.       Hair loss  Respiratory: Negative for chest tightness and shortness of breath.   Cardiovascular: Negative for chest pain and palpitations.  Gastrointestinal: Negative for abdominal pain, nausea and vomiting.  Neurological: Negative for dizziness and weakness.    Social History   Tobacco Use  . Smoking status: Former Smoker    Packs/day: 2.00    Years: 20.00    Pack years: 40.00    Types: Cigarettes    Last attempt to quit: 1980    Years since quitting: 39.0  . Smokeless tobacco: Never Used  . Tobacco comment: smoked about 2 packs per day, smoked for about 20 years; Quit in 1980  Substance Use Topics  . Alcohol use: Yes    Alcohol/week: 0.0 oz    Comment: occasional- 1/month (wine)   Objective:   BP (!) 144/62 (BP Location: Right Arm, Cuff Size: Normal)   Pulse 75   Temp 97.9 F (36.6 C) (Oral)   Resp 16   Wt 168 lb (76.2 kg)   SpO2 99% Comment: room air  BMI 26.31 kg/m  Vitals:   05/03/17 0833 05/03/17 0836  BP: (!) 142/60 (!) 144/62  Pulse: 75   Resp: 16   Temp: 97.9 F (36.6 C)   TempSrc: Oral   SpO2: 99%   Weight: 168 lb (76.2 kg)      Physical Exam   General Appearance:    Alert, cooperative, no distress  Eyes:    PERRL, conjunctiva/corneas clear, EOM's intact       Lungs:     Clear to auscultation bilaterally, respirations unlabored  Heart:    Regular rate and rhythm  Neurologic:   Awake, alert, oriented x 3. No apparent focal neurological           defect.       Results for orders placed or performed in visit on 05/03/17  POCT HgB A1C  Result Value Ref Range   Hemoglobin A1C 7.3    Est. average glucose Bld gHb Est-mCnc 163        Assessment & Plan:     1. Type 2 diabetes mellitus with stage 3 chronic kidney disease, without long-term current use of insulin (HCC) Well controlled.  Continue current medications.   - POCT HgB A1C - Lipid panel  2. Essential hypertension Tolerating change to valsartan-hctz well without adverse  effects. Continue current medications.   - Comprehensive metabolic panel  Return in about 4 months (around 08/31/2017).       Lelon Huh, MD  [facility]

## 2017-05-04 LAB — COMPREHENSIVE METABOLIC PANEL
ALK PHOS: 71 IU/L (ref 39–117)
ALT: 20 IU/L (ref 0–32)
AST: 20 IU/L (ref 0–40)
Albumin/Globulin Ratio: 1.6 (ref 1.2–2.2)
Albumin: 4.3 g/dL (ref 3.5–4.8)
BUN/Creatinine Ratio: 28 (ref 12–28)
BUN: 41 mg/dL — AB (ref 8–27)
Bilirubin Total: <0.2 mg/dL (ref 0.0–1.2)
CO2: 19 mmol/L — AB (ref 20–29)
CREATININE: 1.48 mg/dL — AB (ref 0.57–1.00)
Calcium: 9.4 mg/dL (ref 8.7–10.3)
Chloride: 109 mmol/L — ABNORMAL HIGH (ref 96–106)
GFR calc Af Amer: 40 mL/min/{1.73_m2} — ABNORMAL LOW (ref >59)
GFR calc non Af Amer: 34 mL/min/{1.73_m2} — ABNORMAL LOW (ref >59)
GLUCOSE: 145 mg/dL — AB (ref 65–99)
Globulin, Total: 2.7 g/dL (ref 1.5–4.5)
Potassium: 5.9 mmol/L — ABNORMAL HIGH (ref 3.5–5.2)
Sodium: 141 mmol/L (ref 134–144)
Total Protein: 7 g/dL (ref 6.0–8.5)

## 2017-05-04 LAB — LIPID PANEL
CHOL/HDL RATIO: 3.5 ratio (ref 0.0–4.4)
CHOLESTEROL TOTAL: 169 mg/dL (ref 100–199)
HDL: 48 mg/dL (ref >39)
LDL CALC: 93 mg/dL (ref 0–99)
Triglycerides: 140 mg/dL (ref 0–149)
VLDL CHOLESTEROL CAL: 28 mg/dL (ref 5–40)

## 2017-06-06 ENCOUNTER — Other Ambulatory Visit: Payer: Self-pay | Admitting: Family Medicine

## 2017-06-26 ENCOUNTER — Other Ambulatory Visit: Payer: Self-pay

## 2017-06-26 NOTE — Patient Outreach (Signed)
[facility]) Care Management  06/26/2017  DEVONE BONILLA [birth year] [id number]   Medication Adherence call to Mrs. Jatasia Gundrum patient is showing under [facility] past due on Losartan/HCTZ 100/25 spoke with [facility] they said patient already pick up this medication on March 8th,2019 patient wont be due until April/2019.  [place] Management Direct Dial [phone]  Fax [phone] [email or web address]

## 2017-08-02 ENCOUNTER — Other Ambulatory Visit: Payer: Self-pay | Admitting: Family Medicine

## 2017-08-31 ENCOUNTER — Encounter: Payer: Self-pay | Admitting: Family Medicine

## 2017-08-31 ENCOUNTER — Ambulatory Visit (INDEPENDENT_AMBULATORY_CARE_PROVIDER_SITE_OTHER): Payer: Medicare Other | Admitting: Family Medicine

## 2017-08-31 VITALS — BP 150/64 | HR 76 | Temp 97.9°F | Resp 16 | Ht 67.0 in | Wt 164.0 lb

## 2017-08-31 DIAGNOSIS — N183 Chronic kidney disease, stage 3 unspecified: Secondary | ICD-10-CM

## 2017-08-31 DIAGNOSIS — R351 Nocturia: Secondary | ICD-10-CM | POA: Diagnosis not present

## 2017-08-31 DIAGNOSIS — I10 Essential (primary) hypertension: Secondary | ICD-10-CM | POA: Diagnosis not present

## 2017-08-31 DIAGNOSIS — E1122 Type 2 diabetes mellitus with diabetic chronic kidney disease: Secondary | ICD-10-CM

## 2017-08-31 DIAGNOSIS — Z6825 Body mass index (BMI) 25.0-25.9, adult: Secondary | ICD-10-CM | POA: Diagnosis not present

## 2017-08-31 DIAGNOSIS — R319 Hematuria, unspecified: Secondary | ICD-10-CM

## 2017-08-31 DIAGNOSIS — R0609 Other forms of dyspnea: Secondary | ICD-10-CM

## 2017-08-31 LAB — POCT GLYCOSYLATED HEMOGLOBIN (HGB A1C)
Est. average glucose Bld gHb Est-mCnc: 166
HEMOGLOBIN A1C: 7.4

## 2017-08-31 MED ORDER — TELMISARTAN-HCTZ 80-25 MG PO TABS: 1.0000 | ORAL_TABLET | Freq: Every day | ORAL | 4 refills | Status: DC

## 2017-08-31 NOTE — Progress Notes (Signed)
Patient: Rachel Lester Female    DOB: 1940/09/16   77 y.o.   MRN: [id number] Visit Date: 08/31/2017  Today's Provider: Lelon Huh, MD   Chief Complaint  Patient presents with  . Hypertension  . Diabetes   Subjective:    HPI  Diabetes Mellitus Type II, Follow-up:   Lab Results  Component Value Date   HGBA1C 7.3 05/03/2017   HGBA1C 7.2 01/31/2017   HGBA1C 7.1 09/29/2016    Last seen for diabetes 4 months ago.  Management since then includes no changes. She reports good compliance with treatment. She is not having side effects.  Current symptoms include none and have been stable. Home blood sugar records: fasting range: 160's   Episodes of hypoglycemia? no   Current Insulin Regimen: none Most Recent Eye Exam: 1 year ago Weight trend: fluctuating a bit Prior visit with dietician: no Current diet: well balanced Current exercise: cardiovascular workout on exercise equipment  Pertinent Labs:    Component Value Date/Time   CHOL 169 05/03/2017 0913   TRIG 140 05/03/2017 0913   HDL 48 05/03/2017 0913   LDLCALC 93 05/03/2017 0913   CREATININE 1.48 (H) 05/03/2017 0913   CREATININE 1.23 05/12/2013 0803    Wt Readings from Last 3 Encounters:  05/03/17 168 lb (76.2 kg)  01/31/17 160 lb (72.6 kg)  11/24/16 165 lb (74.8 kg)    ------------------------------------------------------------------------  Hypertension, follow-up:  BP Readings from Last 3 Encounters:  05/03/17 (!) 144/62  01/31/17 (!) 142/60  11/24/16 120/60    She was last seen for hypertension 4 months ago.  BP at that visit was 144/62. Management since that visit includes no changes.Has been changed from valsartan-hctz to losartan-hctz due to recall.  She reports good compliance with treatment. She is not having side effects.  She is exercising. She is adherent to low salt diet.   Outside blood pressures are not checked. She is experiencing none.  Patient denies chest pain, chest  pressure/discomfort, claudication, dyspnea, exertional chest pressure/discomfort, fatigue, irregular heart beat, lower extremity edema, near-syncope, orthopnea, palpitations, paroxysmal nocturnal dyspnea, syncope and tachypnea.   Cardiovascular risk factors include advanced age (older than 61 for men, 51 for women), diabetes mellitus and hypertension.  Use of agents associated with hypertension: NSAIDS.     Weight trend: fluctuating a bit Wt Readings from Last 3 Encounters:  05/03/17 168 lb (76.2 kg)  01/31/17 160 lb (72.6 kg)  11/24/16 165 lb (74.8 kg)    Current diet: well balanced  ------------------------------------------------------------------------     Allergies  Allergen Reactions  . Metformin Hcl     Weight Loss     Current Outpatient Medications:  .  ACCU-CHEK AVIVA PLUS test strip, CHECK BLOOD SUGAR THREE  TIMES DAILY, Disp: 100 each, Rfl: 4 .  aspirin (ASPIRIN ADULT LOW STRENGTH) 81 MG EC tablet, Take 81 mg by mouth daily. Swallow whole., Disp: , Rfl:  .  b complex vitamins tablet, Take 1 tablet by mouth daily., Disp: , Rfl:  .  Biotin 7500 MCG TABS, Take by mouth., Disp: , Rfl:  .  Cholecalciferol (VITAMIN D3) 2000 UNITS capsule, Take 2,000 Units by mouth daily., Disp: , Rfl:  .  Cyanocobalamin (VITAMIN B 12 PO), Take 600 mg by mouth daily., Disp: , Rfl:  .  glimepiride (AMARYL) 4 MG tablet, TAKE ONE TABLET EVERY MORNING, Disp: 30 tablet, Rfl: 11 .  losartan-hydrochlorothiazide (HYZAAR) 100-25 MG tablet, TAKE ONE (1) TABLET EACH DAY, Disp: 30 tablet,  Rfl: 11 .  lovastatin (MEVACOR) 40 MG tablet, TAKE ONE (1) TABLET AT BEDTIME, Disp: 30 tablet, Rfl: 12 .  Omega-3 Fatty Acids (FISH OIL BURP-LESS) 1200 MG CAPS, Take 1 capsule by mouth daily., Disp: , Rfl:  .  pioglitazone (ACTOS) 30 MG tablet, TAKE ONE (1) TABLET EACH DAY, Disp: 30 tablet, Rfl: 12   Review of Systems  Constitutional: Negative for appetite change, chills, fatigue and fever.  Respiratory: Negative  for chest tightness and shortness of breath.   Cardiovascular: Negative for chest pain and palpitations.  Gastrointestinal: Negative for abdominal pain, nausea and vomiting.  Neurological: Negative for dizziness and weakness.    Social History   Tobacco Use  . Smoking status: Former Smoker    Packs/day: 2.00    Years: 20.00    Pack years: 40.00    Types: Cigarettes    Last attempt to quit: 1980    Years since quitting: 39.3  . Smokeless tobacco: Never Used  . Tobacco comment: smoked about 2 packs per day, smoked for about 20 years; Quit in 1980  Substance Use Topics  . Alcohol use: Yes    Alcohol/week: 0.0 oz    Comment: occasional- 1/month (wine)   Objective:   BP (!) 150/64 (BP Location: Right Arm, Cuff Size: Normal)   Pulse 76   Temp 97.9 F (36.6 C) (Oral)   Resp 16   Ht 5\' 7"  (1.702 m)   Wt 164 lb (74.4 kg)   BMI 25.69 kg/m  There were no vitals filed for this visit.   Physical Exam   General Appearance:    Alert, cooperative, no distress  Eyes:    PERRL, conjunctiva/corneas clear, EOM's intact       Lungs:     Clear to auscultation bilaterally, respirations unlabored  Heart:    Regular rate and rhythm  Neurologic:   Awake, alert, oriented x 3. No apparent focal neurological           defect.       Results for orders placed or performed in visit on 08/31/17  POCT HgB A1C  Result Value Ref Range   Hemoglobin A1C 7.4    Est. average glucose Bld gHb Est-mCnc 166         Assessment & Plan:     1. Type 2 diabetes mellitus with stage 3 chronic kidney disease, without long-term current use of insulin ([place]) Fairly well controlled, but increased. She was previously on metformin several years ago, but discontinue due to dramatic weight loss down to 120. Discussed option of trying low dose metformin if her a1c continue to rise, as she is mildly overweight now.   - POCT HgB A1C  2. Essential hypertension Well controlled.  Continue current medications.   -  telmisartan-hydrochlorothiazide (MICARDIS HCT) 80-25 MG tablet; Take 1 tablet by mouth daily.  Dispense: 30 tablet; Refill: 4  3. BMI 25.0-25.9,adult  Return in about 4 months (around 01/01/2018).        Lelon Huh, MD  [facility]

## 2017-09-07 ENCOUNTER — Other Ambulatory Visit: Payer: Self-pay

## 2017-09-07 NOTE — Patient Outreach (Signed)
[facility]) Care Management  09/07/2017  Rachel Lester [birth year] [id number]   Medication adherence for Rachel Lester patient did not answer. [place] said patient is no longer taking Losartan/Hctz 100/25 mg, patient is now on Olmesartan/Hctz but has not pickup this medication from the pharmacy. Patient is showing past due under [place].   [place] Management Direct Dial [phone]  Fax [phone] [email or web address]

## 2017-09-08 ENCOUNTER — Other Ambulatory Visit: Payer: Self-pay | Admitting: Family Medicine

## 2017-10-17 DIAGNOSIS — E113311 Type 2 diabetes mellitus with moderate nonproliferative diabetic retinopathy with macular edema, right eye: Secondary | ICD-10-CM | POA: Diagnosis not present

## 2017-10-18 ENCOUNTER — Encounter: Payer: Self-pay | Admitting: *Deleted

## 2017-10-19 LAB — HM DIABETES EYE EXAM

## 2017-10-27 ENCOUNTER — Other Ambulatory Visit: Payer: Self-pay | Admitting: Family Medicine

## 2017-11-16 NOTE — Progress Notes (Signed)
Patient: Rachel Lester, Female    DOB: 1941/03/30, 77 y.o.   MRN: [id number] Visit Date: 11/17/2017  Today's Provider: Lelon Huh, MD   Chief Complaint  Patient presents with  . Annual Exam  . Diabetes  . Hypertension   Subjective:   Patient saw [facility]enzie for AWV today at 8:40 am.   Complete Physical Rachel Lester is a 77 y.o. female. She feels well. She reports exercising 3 days per week on the treadmill. She reports she is sleeping well.  -----------------------------------------------------------    Hypertension, follow-up:  BP Readings from Last 3 Encounters:  11/17/17 (!) 152/58  11/17/17 (!) 156/58  08/31/17 (!) 150/64    She was last seen for hypertension 3 months ago.  BP at that visit was 150/64. Management since that visit includes; no changes.She reports good compliance with treatment. She is not having side effects.  She is exercising. She is adherent to low salt diet.   Outside blood pressures are not being checked. She is experiencing none.  Patient denies chest pain, chest pressure/discomfort, claudication, dyspnea, exertional chest pressure/discomfort, fatigue, irregular heart beat, lower extremity edema, near-syncope, orthopnea, palpitations, paroxysmal nocturnal dyspnea, syncope and tachypnea.   Cardiovascular risk factors include advanced age (older than 44 for men, 51 for women), diabetes mellitus and hypertension.  Use of agents associated with hypertension: NSAIDS.   ------------------------------------------------------------------------    Review of Systems  Constitutional: Negative.   HENT: Negative.   Eyes: Negative.   Respiratory: Negative.   Cardiovascular: Negative.   Gastrointestinal: Negative.   Endocrine: Negative.   Genitourinary: Negative.   Musculoskeletal: Negative.   Skin: Negative.   Allergic/Immunologic: Negative.   Neurological: Negative.   Hematological: Negative.   Psychiatric/Behavioral: Negative.      Social History   Socioeconomic History  . Marital status: Widowed    Spouse name: Not on file  . Number of children: 2  . Years of education: Not on file  . Highest education level: 12th grade  Occupational History  . Occupation: retired  Scientific laboratory technician  . Financial resource strain: Not hard at all  . Food insecurity:    Worry: Never true    Inability: Never true  . Transportation needs:    Medical: No    Non-medical: No  Tobacco Use  . Smoking status: Former Smoker    Packs/day: 2.00    Years: 20.00    Pack years: 40.00    Types: Cigarettes    Last attempt to quit: 1980    Years since quitting: 39.6  . Smokeless tobacco: Never Used  . Tobacco comment: smoked about 2 packs per day, smoked for about 20 years; Quit in 1980  Substance and Sexual Activity  . Alcohol use: Yes    Alcohol/week: 0.0 oz    Comment: occasional- 1/month (wine)  . Drug use: No  . Sexual activity: Not on file  Lifestyle  . Physical activity:    Days per week: Not on file    Minutes per session: Not on file  . Stress: Not at all  Relationships  . Social connections:    Talks on phone: Not on file    Gets together: Not on file    Attends religious service: Not on file    Active member of club or organization: Not on file    Attends meetings of clubs or organizations: Not on file    Relationship status: Not on file  . Intimate partner violence:    Fear of  current or ex partner: Not on file    Emotionally abused: Not on file    Physically abused: Not on file    Forced sexual activity: Not on file  Other Topics Concern  . Not on file  Social History Narrative  . Not on file    Past Medical History:  Diagnosis Date  . Diabetes mellitus without complication ([place])    type 2  . Hyperlipidemia   . Hypertension      Patient Active Problem List   Diagnosis Date Noted  . Abnormal ankle brachial index (ABI) 09/29/2016  . Diverticulosis 12/30/2014  . Diabetes mellitus with chronic kidney  disease ([place]) 12/30/2014  . Hypercholesterolemia 12/30/2014  . Hyperkalemia 12/30/2014  . Hypertension 12/30/2014  . Chronic kidney disease, stage III (moderate) ([place]) 12/30/2014  . Hematuria 12/30/2014  . Microalbuminuria 12/30/2014    History reviewed. No pertinent surgical history.  Her family history includes Alzheimer's disease in her mother; Arthritis in her sister; Coronary artery disease in her father; Diabetes in her brother and sister.      Current Outpatient Medications:  .  ACCU-CHEK AVIVA PLUS test strip, CHECK BLOOD SUGAR THREE  TIMES DAILY, Disp: 300 each, Rfl: 4 .  aspirin (ASPIRIN ADULT LOW STRENGTH) 81 MG EC tablet, Take 81 mg by mouth daily. Swallow whole., Disp: , Rfl:  .  b complex vitamins tablet, Take 1 tablet by mouth daily., Disp: , Rfl:  .  Biotin 7500 MCG TABS, Take by mouth daily. , Disp: , Rfl:  .  Cholecalciferol (VITAMIN D3) 2000 UNITS capsule, Take 2,000 Units by mouth daily., Disp: , Rfl:  .  Cyanocobalamin (VITAMIN B 12 PO), Take 600 mg by mouth daily., Disp: , Rfl:  .  glimepiride (AMARYL) 4 MG tablet, TAKE ONE TABLET EVERY MORNING, Disp: 30 tablet, Rfl: 11 .  lovastatin (MEVACOR) 40 MG tablet, TAKE ONE (1) TABLET AT BEDTIME, Disp: 30 tablet, Rfl: 12 .  Omega-3 Fatty Acids (FISH OIL BURP-LESS) 1200 MG CAPS, Take 1 capsule by mouth daily., Disp: , Rfl:  .  pioglitazone (ACTOS) 30 MG tablet, TAKE ONE (1) TABLET EACH DAY, Disp: 30 tablet, Rfl: 11 .  telmisartan-hydrochlorothiazide (MICARDIS HCT) 80-25 MG tablet, Take 1 tablet by mouth daily., Disp: 30 tablet, Rfl: 4  Patient Care Team: Birdie Sons, MD as PCP - General (Family Medicine) Birder Robson, MD as Referring Physician (Ophthalmology)     Objective:   Vitals: BP (!) 152/58 (BP Location: Right Arm, Patient Position: Sitting, Cuff Size: Normal)   Pulse 76   Temp 98.8 F (37.1 C) (Oral)   Ht 5\' 7"  (1.702 m)   Wt 168 lb 6.4 oz (76.4 kg)   BMI 26.38 kg/m   Physical  Exam   General Appearance:    Alert, cooperative, no distress, appears stated age  Head:    Normocephalic, without obvious abnormality, atraumatic  Eyes:    PERRL, conjunctiva/corneas clear, EOM's intact, fundi    benign, both eyes  Ears:    Normal TM's and external ear canals, both ears  Nose:   Nares normal, septum midline, mucosa normal, no drainage    or sinus tenderness  Throat:   Lips, mucosa, and tongue normal; teeth and gums normal  Neck:   Supple, symmetrical, trachea midline, no adenopathy;    thyroid:  no enlargement/tenderness/nodules; no carotid   bruit or JVD  Back:     Symmetric, no curvature, ROM normal, no CVA tenderness  Lungs:     Clear  to auscultation bilaterally, respirations unlabored  Chest Wall:    No tenderness or deformity   Heart:    Regular rate and rhythm, S1 and S2 normal, no murmur, rub   or gallop  Breast Exam:    patient declined  Abdomen:     Soft, non-tender, bowel sounds active all four quadrants,    no masses, no organomegaly  Pelvic:    not indicated; post-menopausal, no abnormal Pap smears in past  Extremities:   Extremities normal, atraumatic, no cyanosis or edema  Pulses:   2+ and symmetric all extremities  Skin:   Skin color, texture, turgor normal, no rashes or lesions  Lymph nodes:   Cervical, supraclavicular, and axillary nodes normal  Neurologic:   CNII-XII intact, normal strength, sensation and reflexes    throughout    Activities of Daily Living In your present state of health, do you have any difficulty performing the following activities: 11/17/2017  Hearing? N  Vision? N  Difficulty concentrating or making decisions? N  Walking or climbing stairs? N  Dressing or bathing? N  Doing errands, shopping? N  Preparing Food and eating ? N  Using the Toilet? N  In the past six months, have you accidently leaked urine? N  Do you have problems with loss of bowel control? N  Managing your Medications? N  Managing your Finances? N   Housekeeping or managing your Housekeeping? N  Some recent data might be hidden    Fall Risk Assessment Fall Risk  11/17/2017 11/16/2016 03/29/2016 01/12/2015  Falls in the past year? No No No No     Depression Screen PHQ 2/9 Scores 11/17/2017 11/16/2016 03/29/2016 01/12/2015  PHQ - 2 Score 0 0 0 0     EKG: NSR  Assessment & Plan:    Annual Physical Reviewed patient's Family Medical History Reviewed and updated list of patient's medical providers Assessment of cognitive impairment was done Assessed patient's functional ability Established a written schedule for health screening [place] Completed and Reviewed  Exercise Activities and Dietary recommendations Goals    . Exercise 3x per week (30 min per time)     Recommend increasing time walking on treadmill to 20-30 minutes at a time vs 10 minutes.        Immunization History  Administered Date(s) Administered  . Influenza, High Dose Seasonal PF 01/12/2015, 03/29/2016, 01/31/2017  . Pneumococcal Conjugate-13 10/07/2013  . Pneumococcal Polysaccharide-23 02/17/2004, 02/09/2011  . Td 09/01/2006  . Tdap 09/29/2016  . Zoster 04/09/2009    Health Maintenance  Topic Date Due  . FOOT EXAM  09/29/2017  . INFLUENZA VACCINE  11/16/2017  . HEMOGLOBIN A1C  03/03/2018  . OPHTHALMOLOGY EXAM  10/20/2018  . TETANUS/TDAP  09/30/2026  . DEXA SCAN  Completed  . PNA vac Low Risk Adult  Completed     Discussed health benefits of physical activity, and encouraged her to engage in regular exercise appropriate for her age and condition.    ------------------------------------------------------------------------------------------------------------  1. Annual physical exam   2. Estrogen deficiency  - DG Bone Density; Future  3. Essential hypertension Systolic is elevated today. Is to work on cutting back on sodium and exercising more. Consider adding amlodipine if not improved at follow up. - EKG  12-Lead   Lelon Huh, MD  [facility]

## 2017-11-17 ENCOUNTER — Ambulatory Visit (INDEPENDENT_AMBULATORY_CARE_PROVIDER_SITE_OTHER): Payer: Medicare Other | Admitting: Family Medicine

## 2017-11-17 ENCOUNTER — Ambulatory Visit (INDEPENDENT_AMBULATORY_CARE_PROVIDER_SITE_OTHER): Payer: Medicare Other

## 2017-11-17 ENCOUNTER — Encounter: Payer: Self-pay | Admitting: Family Medicine

## 2017-11-17 VITALS — BP 156/58 | HR 76 | Temp 98.8°F | Ht 67.0 in | Wt 168.4 lb

## 2017-11-17 VITALS — BP 152/58 | HR 76 | Temp 98.8°F | Ht 67.0 in | Wt 168.4 lb

## 2017-11-17 DIAGNOSIS — I10 Essential (primary) hypertension: Secondary | ICD-10-CM

## 2017-11-17 DIAGNOSIS — Z0000 Encounter for general adult medical examination without abnormal findings: Secondary | ICD-10-CM

## 2017-11-17 DIAGNOSIS — E2839 Other primary ovarian failure: Secondary | ICD-10-CM

## 2017-11-17 NOTE — Progress Notes (Signed)
Subjective:   Rachel Lester is a 77 y.o. female who presents for Medicare Annual (Subsequent) preventive examination.  Review of Systems:  N/A  Cardiac Risk Factors include: advanced age (>65men, >88 women);diabetes mellitus;dyslipidemia;hypertension     Objective:     Vitals: BP (!) 156/58 (BP Location: Right Arm)   Pulse 76   Temp 98.8 F (37.1 C) (Oral)   Ht 5\' 7"  (1.702 m)   Wt 168 lb 6.4 oz (76.4 kg)   BMI 26.38 kg/m   Body mass index is 26.38 kg/m.  Advanced Directives 11/17/2017 11/16/2016 01/12/2015  Does Patient Have a Medical Advance Directive? No No No  Would patient like information on creating a medical advance directive? Yes (MAU/Ambulatory/Procedural Areas - Information given) No - Patient declined -    Tobacco Social History   Tobacco Use  Smoking Status Former Smoker  . Packs/day: 2.00  . Years: 20.00  . Pack years: 40.00  . Types: Cigarettes  . Last attempt to quit: 1980  . Years since quitting: 39.6  Smokeless Tobacco Never Used  Tobacco Comment   smoked about 2 packs per day, smoked for about 20 years; Quit in [place] given: Not Answered Comment: smoked about 2 packs per day, smoked for about 20 years; Quit in 1980   Clinical Intake:  Pre-visit preparation completed: Yes  Pain : No/denies pain Pain Score: 0-No pain     Nutritional Status: BMI 25 -29 Overweight Nutritional Risks: None Diabetes: Yes(type 2) CBG done?: No Did pt. bring in CBG monitor from home?: No  How often do you need to have someone help you when you read instructions, pamphlets, or other written materials from your doctor or pharmacy?: 1 - Never  Interpreter Needed?: No  Information entered by :: [facility], LPN  Past Medical History:  Diagnosis Date  . Diabetes mellitus without complication ([place])    type 2  . Hyperlipidemia   . Hypertension    History reviewed. No pertinent surgical history. Family History  Problem Relation Age of Onset  .  Alzheimer's disease Mother   . Coronary artery disease Father   . Arthritis Sister        Lupus  . Diabetes Brother        Type 2  . Diabetes Sister        Type 2   Social History   Socioeconomic History  . Marital status: Widowed    Spouse name: Not on file  . Number of children: 2  . Years of education: Not on file  . Highest education level: 12th grade  Occupational History  . Occupation: retired  Scientific laboratory technician  . Financial resource strain: Not hard at all  . Food insecurity:    Worry: Never true    Inability: Never true  . Transportation needs:    Medical: No    Non-medical: No  Tobacco Use  . Smoking status: Former Smoker    Packs/day: 2.00    Years: 20.00    Pack years: 40.00    Types: Cigarettes    Last attempt to quit: 1980    Years since quitting: 39.6  . Smokeless tobacco: Never Used  . Tobacco comment: smoked about 2 packs per day, smoked for about 20 years; Quit in 1980  Substance and Sexual Activity  . Alcohol use: Yes    Alcohol/week: 0.0 oz    Comment: occasional- 1/month (wine)  . Drug use: No  . Sexual activity: Not on  file  Lifestyle  . Physical activity:    Days per week: Not on file    Minutes per session: Not on file  . Stress: Not at all  Relationships  . Social connections:    Talks on phone: Not on file    Gets together: Not on file    Attends religious service: Not on file    Active member of club or organization: Not on file    Attends meetings of clubs or organizations: Not on file    Relationship status: Not on file  Other Topics Concern  . Not on file  Social History Narrative  . Not on file    Outpatient Encounter Medications as of 11/17/2017  Medication Sig  . ACCU-CHEK AVIVA PLUS test strip CHECK BLOOD SUGAR THREE  TIMES DAILY  . aspirin (ASPIRIN ADULT LOW STRENGTH) 81 MG EC tablet Take 81 mg by mouth daily. Swallow whole.  . b complex vitamins tablet Take 1 tablet by mouth daily.  . Biotin 7500 MCG TABS Take by mouth  daily.   . Cholecalciferol (VITAMIN D3) 2000 UNITS capsule Take 2,000 Units by mouth daily.  . Cyanocobalamin (VITAMIN B 12 PO) Take 600 mg by mouth daily.  Marland Kitchen glimepiride (AMARYL) 4 MG tablet TAKE ONE TABLET EVERY MORNING  . lovastatin (MEVACOR) 40 MG tablet TAKE ONE (1) TABLET AT BEDTIME  . Omega-3 Fatty Acids (FISH OIL BURP-LESS) 1200 MG CAPS Take 1 capsule by mouth daily.  . pioglitazone (ACTOS) 30 MG tablet TAKE ONE (1) TABLET EACH DAY  . telmisartan-hydrochlorothiazide (MICARDIS HCT) 80-25 MG tablet Take 1 tablet by mouth daily.   No facility-administered encounter medications on file as of 11/17/2017.     Activities of Daily Living In your present state of health, do you have any difficulty performing the following activities: 11/17/2017  Hearing? N  Vision? N  Difficulty concentrating or making decisions? N  Walking or climbing stairs? N  Dressing or bathing? N  Doing errands, shopping? N  Preparing Food and eating ? N  Using the Toilet? N  In the past six months, have you accidently leaked urine? N  Do you have problems with loss of bowel control? N  Managing your Medications? N  Managing your Finances? N  Housekeeping or managing your Housekeeping? N  Some recent data might be hidden    Patient Care Team: Birdie Sons, MD as PCP - General (Family Medicine) Birder Robson, MD as Referring Physician (Ophthalmology)    Assessment:   This is a routine wellness examination for [place]ette.  Exercise Activities and Dietary recommendations Current Exercise Habits: Home exercise routine, Type of exercise: treadmill, Time (Minutes): 10, Frequency (Times/Week): 3, Weekly Exercise (Minutes/Week): 30, Intensity: Mild, Exercise limited by: None identified  Goals    . Exercise 3x per week (30 min per time)     Recommend increasing time walking on treadmill to 20-30 minutes at a time vs 10 minutes.        Fall Risk Fall Risk  11/17/2017 11/16/2016 03/29/2016 01/12/2015  Falls in  the past year? No No No No   Is the patient's home free of loose throw rugs in walkways, pet beds, electrical cords, etc?   yes      Grab bars in the bathroom? no      Handrails on the stairs?   yes      Adequate lighting?   yes  Timed Get Up and Go performed: N/A  Depression Screen PHQ 2/9 Scores 11/17/2017  11/16/2016 03/29/2016 01/12/2015  PHQ - 2 Score 0 0 0 0     Cognitive Function: Pt declined screening today.      6CIT Screen 11/16/2016  What Year? 0 points  What month? 0 points  What time? 0 points  Count back from 20 0 points  Months in reverse 0 points  Repeat phrase 0 points  Total Score 0    Immunization History  Administered Date(s) Administered  . Influenza, High Dose Seasonal PF 01/12/2015, 03/29/2016, 01/31/2017  . Pneumococcal Conjugate-13 10/07/2013  . Pneumococcal Polysaccharide-23 02/17/2004, 02/09/2011  . Td 09/01/2006  . Tdap 09/29/2016  . Zoster 04/09/2009    Qualifies for Shingles Vaccine? Due for Shingles vaccine. Declined my offer to administer today. Education has been provided regarding the importance of this vaccine. Pt has been advised to call her insurance company to determine her out of pocket expense. Advised she may also receive this vaccine at her local pharmacy or Health Dept. Verbalized acceptance and understanding.  Screening Tests Health Maintenance  Topic Date Due  . FOOT EXAM  09/29/2017  . INFLUENZA VACCINE  11/16/2017  . HEMOGLOBIN A1C  03/03/2018  . OPHTHALMOLOGY EXAM  10/20/2018  . TETANUS/TDAP  09/30/2026  . DEXA SCAN  Completed  . PNA vac Low Risk Adult  Completed    Cancer Screenings: Lung: Low Dose CT Chest recommended if Age 22-80 years, 30 pack-year currently smoking OR have quit w/in 15years. Patient does not qualify. Breast:  Up to date on Mammogram? Yes   Up to date of Bone Density/Dexa? Yes Colorectal: Up to date  Additional Screenings:  Hepatitis C Screening: N/A     Plan:  I have personally reviewed and  addressed the Medicare Annual Wellness questionnaire and have noted the following in the patient's chart:  A. Medical and social history B. Use of alcohol, tobacco or illicit drugs  C. Current medications and supplements D. Functional ability and status E.  Nutritional status F.  Physical activity G. Advance directives H. List of other physicians I.  Hospitalizations, surgeries, and ER visits in previous 12 months J.  [place] such as hearing and vision if needed, cognitive and depression L. Referrals and appointments - none  In addition, I have reviewed and discussed with patient certain preventive protocols, quality metrics, and best practice recommendations. A written personalized care plan for preventive services as well as general preventive health recommendations were provided to patient.  See attached scanned questionnaire for additional information.   Signed,  Fabio Neighbors, LPN Nurse Health Advisor   Nurse Recommendations: Pt needs a diabetic foot exam today.

## 2017-11-17 NOTE — Patient Instructions (Signed)
   The CDC recommends two doses of Shingrix (the shingles vaccine) separated by 2 to 6 months for adults age 76 years and older. I recommend checking with your insurance plan regarding coverage for this vaccine.   

## 2017-11-17 NOTE — Patient Instructions (Addendum)
[name] , Thank you for taking time to come for your Medicare Wellness Visit. I appreciate your ongoing commitment to your health goals. Please review the following plan we discussed and let me know if I can assist you in the future.   Screening recommendations/referrals: Colonoscopy: Up to date Mammogram: Up to date Bone Density: Up to date Recommended yearly ophthalmology/optometry visit for glaucoma screening and checkup Recommended yearly dental visit for hygiene and checkup  Vaccinations: Influenza vaccine: Up to date Pneumococcal vaccine: Up to date Tdap vaccine: Up to date Shingles vaccine: Pt declines today.     Advanced directives: Advance directive discussed with you today. I have provided a copy for you to complete at home and have notarized. Once this is complete please bring a copy in to our office so we can scan it into your chart.  Conditions/risks identified: Recommend increasing time walking on treadmill to 20-30 minutes at a time vs 10 minutes.    Next appointment: 9:40 AM today with Dr Rachel Lester.    Preventive Care 19 Years and Older, Female Preventive care refers to lifestyle choices and visits with your health care provider that can promote health and wellness. What does preventive care include?  A yearly physical exam. This is also called an annual well check.  Dental exams once or twice a year.  Routine eye exams. Ask your health care provider how often you should have your eyes checked.  Personal lifestyle choices, including:  Daily care of your teeth and gums.  Regular physical activity.  Eating a healthy diet.  Avoiding tobacco and drug use.  Limiting alcohol use.  Practicing safe sex.  Taking low-dose aspirin every day.  Taking vitamin and mineral supplements as recommended by your health care provider. What happens during an annual well check? The services and screenings done by your health care provider during your annual well check will  depend on your age, overall health, lifestyle risk factors, and family history of disease. Counseling  Your health care provider may ask you questions about your:  Alcohol use.  Tobacco use.  Drug use.  Emotional well-being.  Home and relationship well-being.  Sexual activity.  Eating habits.  History of falls.  Memory and ability to understand (cognition).  Work and work Statistician.  Reproductive health. Screening  You may have the following tests or measurements:  Height, weight, and BMI.  Blood pressure.  Lipid and cholesterol levels. These may be checked every 5 years, or more frequently if you are over 74 years old.  Skin check.  Lung cancer screening. You may have this screening every year starting at age 55 if you have a 30-pack-year history of smoking and currently smoke or have quit within the past 15 years.  Fecal occult blood test (FOBT) of the stool. You may have this test every year starting at age 47.  Flexible sigmoidoscopy or colonoscopy. You may have a sigmoidoscopy every 5 years or a colonoscopy every 10 years starting at age 89.  Hepatitis C blood test.  Hepatitis B blood test.  Sexually transmitted disease (STD) testing.  Diabetes screening. This is done by checking your blood sugar (glucose) after you have not eaten for a while (fasting). You may have this done every 1-3 years.  Bone density scan. This is done to screen for osteoporosis. You may have this done starting at age 2.  Mammogram. This may be done every 1-2 years. Talk to your health care provider about how often you should have regular  mammograms. Talk with your health care provider about your test results, treatment options, and if necessary, the need for more tests. Vaccines  Your health care provider may recommend certain vaccines, such as:  Influenza vaccine. This is recommended every year.  Tetanus, diphtheria, and acellular pertussis (Tdap, Td) vaccine. You may need a  Td booster every 10 years.  Zoster vaccine. You may need this after age 7.  Pneumococcal 13-valent conjugate (PCV13) vaccine. One dose is recommended after age 69.  Pneumococcal polysaccharide (PPSV23) vaccine. One dose is recommended after age 27. Talk to your health care provider about which screenings and vaccines you need and how often you need them. This information is not intended to replace advice given to you by your health care provider. Make sure you discuss any questions you have with your health care provider. Document Released: 05/01/2015 Document Revised: 12/23/2015 Document Reviewed: 02/03/2015 Elsevier Interactive Patient Education  2017 [place] Prevention in the Home Falls can cause injuries. They can happen to people of all ages. There are many things you can do to make your home safe and to help prevent falls. What can I do on the outside of my home?  Regularly fix the edges of walkways and driveways and fix any cracks.  Remove anything that might make you trip as you walk through a door, such as a raised step or threshold.  Trim any bushes or trees on the path to your home.  Use bright outdoor lighting.  Clear any walking paths of anything that might make someone trip, such as rocks or tools.  Regularly check to see if handrails are loose or broken. Make sure that both sides of any steps have handrails.  Any raised decks and porches should have guardrails on the edges.  Have any leaves, snow, or ice cleared regularly.  Use sand or salt on walking paths during winter.  Clean up any spills in your garage right away. This includes oil or grease spills. What can I do in the bathroom?  Use night lights.  Install grab bars by the toilet and in the tub and shower. Do not use towel bars as grab bars.  Use non-skid mats or decals in the tub or shower.  If you need to sit down in the shower, use a plastic, non-slip stool.  Keep the floor dry. Clean  up any water that spills on the floor as soon as it happens.  Remove soap buildup in the tub or shower regularly.  Attach bath mats securely with double-sided non-slip rug tape.  Do not have throw rugs and other things on the floor that can make you trip. What can I do in the bedroom?  Use night lights.  Make sure that you have a light by your bed that is easy to reach.  Do not use any sheets or blankets that are too big for your bed. They should not hang down onto the floor.  Have a firm chair that has side arms. You can use this for support while you get dressed.  Do not have throw rugs and other things on the floor that can make you trip. What can I do in the kitchen?  Clean up any spills right away.  Avoid walking on wet floors.  Keep items that you use a lot in easy-to-reach places.  If you need to reach something above you, use a strong step stool that has a grab bar.  Keep electrical cords out of the way.  Do not use floor polish or wax that makes floors slippery. If you must use wax, use non-skid floor wax.  Do not have throw rugs and other things on the floor that can make you trip. What can I do with my stairs?  Do not leave any items on the stairs.  Make sure that there are handrails on both sides of the stairs and use them. Fix handrails that are broken or loose. Make sure that handrails are as long as the stairways.  Check any carpeting to make sure that it is firmly attached to the stairs. Fix any carpet that is loose or worn.  Avoid having throw rugs at the top or bottom of the stairs. If you do have throw rugs, attach them to the floor with carpet tape.  Make sure that you have a light switch at the top of the stairs and the bottom of the stairs. If you do not have them, ask someone to add them for you. What else can I do to help prevent falls?  Wear shoes that:  Do not have high heels.  Have rubber bottoms.  Are comfortable and fit you well.  Are  closed at the toe. Do not wear sandals.  If you use a stepladder:  Make sure that it is fully opened. Do not climb a closed stepladder.  Make sure that both sides of the stepladder are locked into place.  Ask someone to hold it for you, if possible.  Clearly mark and make sure that you can see:  Any grab bars or handrails.  First and last steps.  Where the edge of each step is.  Use tools that help you move around (mobility aids) if they are needed. These include:  Canes.  Walkers.  Scooters.  Crutches.  Turn on the lights when you go into a dark area. Replace any light bulbs as soon as they burn out.  Set up your furniture so you have a clear path. Avoid moving your furniture around.  If any of your floors are uneven, fix them.  If there are any pets around you, be aware of where they are.  Review your medicines with your doctor. Some medicines can make you feel dizzy. This can increase your chance of falling. Ask your doctor what other things that you can do to help prevent falls. This information is not intended to replace advice given to you by your health care provider. Make sure you discuss any questions you have with your health care provider. Document Released: 01/29/2009 Document Revised: 09/10/2015 Document Reviewed: 05/09/2014 Elsevier Interactive Patient Education  2017 [employer].

## 2017-11-22 ENCOUNTER — Telehealth: Payer: Self-pay | Admitting: Family Medicine

## 2017-11-22 NOTE — Telephone Encounter (Signed)
Bone density and mammogram scheduled at [facility] 11/30/17 at 9:30.Pt advised

## 2017-11-28 DIAGNOSIS — E113311 Type 2 diabetes mellitus with moderate nonproliferative diabetic retinopathy with macular edema, right eye: Secondary | ICD-10-CM | POA: Diagnosis not present

## 2017-11-30 DIAGNOSIS — Z1231 Encounter for screening mammogram for malignant neoplasm of breast: Secondary | ICD-10-CM | POA: Diagnosis not present

## 2017-11-30 DIAGNOSIS — Z780 Asymptomatic menopausal state: Secondary | ICD-10-CM | POA: Diagnosis not present

## 2017-11-30 DIAGNOSIS — M8588 Other specified disorders of bone density and structure, other site: Secondary | ICD-10-CM | POA: Diagnosis not present

## 2017-11-30 LAB — HM DEXA SCAN

## 2017-11-30 LAB — HM MAMMOGRAPHY

## 2017-12-04 ENCOUNTER — Encounter: Payer: Self-pay | Admitting: *Deleted

## 2017-12-27 ENCOUNTER — Other Ambulatory Visit: Payer: Self-pay

## 2017-12-27 NOTE — Patient Outreach (Signed)
[facility]) Care Management  12/27/2017  Rachel Lester 09-10-1940 [id number]   Medication Adherence call to Rachel Lester spoke with patient she ask if we can call [facility] and order this medication Telmisartan/ Hctz 80/25 from the pharmacy. Pharmacy said they will have it ready for patient to pick, patient does not want a 90 days supply and wants to continued going to the retail pharmacy for some of her medications. [name] is showing past due under [place].  [place] Management Direct Dial [phone]  Fax [phone] [email or web address]

## 2018-01-02 ENCOUNTER — Ambulatory Visit (INDEPENDENT_AMBULATORY_CARE_PROVIDER_SITE_OTHER): Payer: Medicare Other | Admitting: Family Medicine

## 2018-01-02 ENCOUNTER — Encounter: Payer: Self-pay | Admitting: Family Medicine

## 2018-01-02 VITALS — BP 154/60 | HR 69 | Temp 97.8°F | Resp 16 | Ht 67.0 in | Wt 170.0 lb

## 2018-01-02 DIAGNOSIS — N183 Chronic kidney disease, stage 3 unspecified: Secondary | ICD-10-CM

## 2018-01-02 DIAGNOSIS — I10 Essential (primary) hypertension: Secondary | ICD-10-CM

## 2018-01-02 DIAGNOSIS — E1122 Type 2 diabetes mellitus with diabetic chronic kidney disease: Secondary | ICD-10-CM

## 2018-01-02 DIAGNOSIS — Z23 Encounter for immunization: Secondary | ICD-10-CM

## 2018-01-02 LAB — POCT GLYCOSYLATED HEMOGLOBIN (HGB A1C)
Est. average glucose Bld gHb Est-mCnc: 148
Hemoglobin A1C: 6.8 % — AB (ref 4.0–5.6)

## 2018-01-02 MED ORDER — AMLODIPINE BESYLATE 2.5 MG PO TABS: 2.5000 mg | ORAL_TABLET | Freq: Every day | ORAL | 3 refills | Status: DC

## 2018-01-02 NOTE — Progress Notes (Signed)
Patient: Rachel Lester Female    DOB: October 22, 1940   77 y.o.   MRN: [id number] Visit Date: 01/02/2018  Today's Provider: Lelon Huh, MD   Chief Complaint  Patient presents with  . Diabetes  . Hypertension   Subjective:    HPI  Diabetes Mellitus Type II, Follow-up:   Lab Results  Component Value Date   HGBA1C 7.4 08/31/2017   HGBA1C 7.3 05/03/2017   HGBA1C 7.2 01/31/2017    Last seen for diabetes 4 months ago.  Management since then includes improving diet and exercising daily.  She reports good compliance with treatment. She is not having side effects.  Current symptoms include none and have been stable. Home blood sugar records: fasting range: 113 this morning  Episodes of hypoglycemia? no   Current Insulin Regimen: none Most Recent Eye Exam: 10/2017 Weight trend: fluctuating a bit Prior visit with dietician: no Current diet: well balanced Current exercise: cardiovascular workout on exercise equipment  Pertinent Labs:    Component Value Date/Time   CHOL 169 05/03/2017 0913   TRIG 140 05/03/2017 0913   HDL 48 05/03/2017 0913   LDLCALC 93 05/03/2017 0913   CREATININE 1.48 (H) 05/03/2017 0913   CREATININE 1.23 05/12/2013 0803    Wt Readings from Last 3 Encounters:  01/02/18 170 lb (77.1 kg)  11/17/17 168 lb 6.4 oz (76.4 kg)  11/17/17 168 lb 6.4 oz (76.4 kg)    ------------------------------------------------------------------------  Hypertension, follow-up:  BP Readings from Last 3 Encounters:  01/02/18 (!) 154/60  11/17/17 (!) 152/58  11/17/17 (!) 156/58    She was last seen for hypertension 6 weeks ago.  BP at that visit was 152/58. Management since that visit includes counseling patient to cut back on sodium and exercise more. She reports good compliance with treatment. She is not having side effects.  She is exercising. She is adherent to low salt diet.   Outside blood pressures are not being checked. She is experiencing none.    Patient denies chest pain, chest pressure/discomfort, claudication, dyspnea, exertional chest pressure/discomfort, fatigue, irregular heart beat, lower extremity edema, near-syncope, orthopnea, palpitations, paroxysmal nocturnal dyspnea, syncope and tachypnea.   Cardiovascular risk factors include advanced age (older than 14 for men, 46 for women), diabetes mellitus and hypertension.  Use of agents associated with hypertension: NSAIDS.     Weight trend: fluctuating a bit Wt Readings from Last 3 Encounters:  01/02/18 170 lb (77.1 kg)  11/17/17 168 lb 6.4 oz (76.4 kg)  11/17/17 168 lb 6.4 oz (76.4 kg)    Current diet: well balanced  ------------------------------------------------------------------------     Allergies  Allergen Reactions  . Metformin Hcl     Weight Loss     Current Outpatient Medications:  .  ACCU-CHEK AVIVA PLUS test strip, CHECK BLOOD SUGAR THREE  TIMES DAILY, Disp: 300 each, Rfl: 4 .  aspirin (ASPIRIN ADULT LOW STRENGTH) 81 MG EC tablet, Take 81 mg by mouth daily. Swallow whole., Disp: , Rfl:  .  b complex vitamins tablet, Take 1 tablet by mouth daily., Disp: , Rfl:  .  Biotin 7500 MCG TABS, Take by mouth daily. , Disp: , Rfl:  .  Cholecalciferol (VITAMIN D3) 2000 UNITS capsule, Take 2,000 Units by mouth daily., Disp: , Rfl:  .  Cyanocobalamin (VITAMIN B 12 PO), Take 600 mg by mouth daily., Disp: , Rfl:  .  glimepiride (AMARYL) 4 MG tablet, TAKE ONE TABLET EVERY MORNING, Disp: 30 tablet, Rfl: 11 .  lovastatin (MEVACOR) 40 MG tablet, TAKE ONE (1) TABLET AT BEDTIME, Disp: 30 tablet, Rfl: 12 .  Omega-3 Fatty Acids (FISH OIL BURP-LESS) 1200 MG CAPS, Take 1 capsule by mouth daily., Disp: , Rfl:  .  pioglitazone (ACTOS) 30 MG tablet, TAKE ONE (1) TABLET EACH DAY, Disp: 30 tablet, Rfl: 11 .  telmisartan-hydrochlorothiazide (MICARDIS HCT) 80-25 MG tablet, Take 1 tablet by mouth daily., Disp: 30 tablet, Rfl: 4  Review of Systems  Constitutional: Negative for appetite  change, chills, fatigue and fever.  Respiratory: Negative for chest tightness and shortness of breath.   Cardiovascular: Negative for chest pain and palpitations.  Gastrointestinal: Negative for abdominal pain, nausea and vomiting.  Neurological: Negative for dizziness and weakness.    Social History   Tobacco Use  . Smoking status: Former Smoker    Packs/day: 2.00    Years: 20.00    Pack years: 40.00    Types: Cigarettes    Last attempt to quit: 1980    Years since quitting: 39.7  . Smokeless tobacco: Never Used  . Tobacco comment: smoked about 2 packs per day, smoked for about 20 years; Quit in 1980  Substance Use Topics  . Alcohol use: Yes    Alcohol/week: 0.0 standard drinks    Comment: occasional- 1/month (wine)   Objective:    Vitals:   01/02/18 0821 01/02/18 0822  BP: (!) 152/62 (!) 154/60  Pulse: 69   Resp: 16   Temp: 97.8 F (36.6 C)   TempSrc: Oral   SpO2: 99%   Weight: 170 lb (77.1 kg)   Height: 5\' 7"  (1.702 m)      Physical Exam   General Appearance:    Alert, cooperative, no distress  Eyes:    PERRL, conjunctiva/corneas clear, EOM's intact       Lungs:     Clear to auscultation bilaterally, respirations unlabored  Heart:    Regular rate and rhythm  Neurologic:   Awake, alert, oriented x 3. No apparent focal neurological           defect.       Results for orders placed or performed in visit on 01/02/18  POCT HgB A1C  Result Value Ref Range   Hemoglobin A1C 6.8 (A) 4.0 - 5.6 %   HbA1c POC (<> result, manual entry)     HbA1c, POC (prediabetic range)     HbA1c, POC (controlled diabetic range)     Est. average glucose Bld gHb Est-mCnc 148        Assessment & Plan:     1. Type 2 diabetes mellitus with stage 3 chronic kidney disease, without long-term current use of insulin (HCC) Doing well on current medications, diet and exercise level - POCT HgB A1C  2. Essential hypertension add- amLODipine (NORVASC) 2.5 MG tablet; Take 1 tablet (2.5 mg  total) by mouth daily.  Dispense: 30 tablet; Refill: 3  3. Need for influenza vaccination  - Flu vaccine HIGH DOSE PF (Fluzone High dose)   Return in about 3 months (around 04/03/2018).       Lelon Huh, MD  [facility]

## 2018-03-06 ENCOUNTER — Other Ambulatory Visit: Payer: Self-pay | Admitting: Family Medicine

## 2018-03-06 DIAGNOSIS — I10 Essential (primary) hypertension: Secondary | ICD-10-CM

## 2018-03-06 MED ORDER — TELMISARTAN-HCTZ 80-25 MG PO TABS: 1.0000 | ORAL_TABLET | Freq: Every day | ORAL | 11 refills | Status: DC

## 2018-03-06 NOTE — Telephone Encounter (Signed)
[facility] faxed refill request for the following medications:  telmisartan-hydrochlorothiazide (MICARDIS HCT) 80-25 MG tablet   Qty: 30  Date written: 08/31/2017  Last dispensed: 02/01/2018  Please advise.

## 2018-04-02 DIAGNOSIS — E113311 Type 2 diabetes mellitus with moderate nonproliferative diabetic retinopathy with macular edema, right eye: Secondary | ICD-10-CM | POA: Diagnosis not present

## 2018-04-03 ENCOUNTER — Encounter: Payer: Self-pay | Admitting: Family Medicine

## 2018-04-03 ENCOUNTER — Ambulatory Visit (INDEPENDENT_AMBULATORY_CARE_PROVIDER_SITE_OTHER): Payer: Medicare Other | Admitting: Family Medicine

## 2018-04-03 VITALS — BP 138/68 | HR 80 | Temp 98.1°F | Resp 16 | Wt 173.0 lb

## 2018-04-03 DIAGNOSIS — N183 Chronic kidney disease, stage 3 unspecified: Secondary | ICD-10-CM

## 2018-04-03 DIAGNOSIS — I10 Essential (primary) hypertension: Secondary | ICD-10-CM | POA: Diagnosis not present

## 2018-04-03 DIAGNOSIS — E1122 Type 2 diabetes mellitus with diabetic chronic kidney disease: Secondary | ICD-10-CM | POA: Diagnosis not present

## 2018-04-03 DIAGNOSIS — E7800 Pure hypercholesterolemia, unspecified: Secondary | ICD-10-CM

## 2018-04-03 LAB — POCT UA - MICROALBUMIN: MICROALBUMIN (UR) POC: 50 mg/L

## 2018-04-03 LAB — POCT GLYCOSYLATED HEMOGLOBIN (HGB A1C)
ESTIMATED AVERAGE GLUCOSE: 169
HEMOGLOBIN A1C: 7.5 % — AB (ref 4.0–5.6)

## 2018-04-03 NOTE — Patient Instructions (Signed)
.   Please go to the lab draw station in [address] on the second floor of [facility]

## 2018-04-03 NOTE — Progress Notes (Signed)
Patient: Rachel Lester Female    DOB: 07-Nov-1940   77 y.o.   MRN: [id number] Visit Date: 04/03/2018  Today's Provider: Lelon Huh, MD   Chief Complaint  Patient presents with  . Diabetes  . Hypertension  . Hyperlipidemia   Subjective:     HPI   Diabetes Mellitus Type II, Follow-up:   Lab Results  Component Value Date   HGBA1C 6.8 (A) 01/02/2018   HGBA1C 7.4 08/31/2017   HGBA1C 7.3 05/03/2017   Last seen for diabetes 3 months ago.  Management since then includes No Changes. She reports excellent compliance with treatment. She is not having side effects.  Current symptoms include none and have been stable. Home blood sugar records: fasting range: 160's  Episodes of hypoglycemia? no   Current Insulin Regimen: None Most Recent Eye Exam: UTD 04/02/2018 Weight trend: stable Current diet: in general, a "healthy" diet   Current exercise: Regularly  ------------------------------------------------------------------------   Hypertension, follow-up:  BP Readings from Last 3 Encounters:  04/03/18 (!) 142/58  01/02/18 (!) 154/60  11/17/17 (!) 152/58    She was last seen for hypertension 3 months ago.  BP at that visit was 152/62. Management since that visit includes adding Amlodipine 2.5mg  daily She reports excellent compliance with treatment. She is not having side effects.  She is exercising. She is adherent to low salt diet.   Outside blood pressures are 110's-130's/70-80's. She is experiencing none.  Patient denies chest pain, exertional chest pressure/discomfort, irregular heart beat, lower extremity edema and palpitations.   Cardiovascular risk factors include advanced age (older than 67 for men, 71 for women).  Use of agents associated with hypertension: none.   ------------------------------------------------------------------------    Lipid/Cholesterol, Follow-up:   Last seen for this 11 months ago.  Management since that visit  includes No changes.  Last Lipid Panel:    Component Value Date/Time   CHOL 169 05/03/2017 0913   TRIG 140 05/03/2017 0913   HDL 48 05/03/2017 0913   CHOLHDL 3.5 05/03/2017 0913   LDLCALC 93 05/03/2017 0913    She reports excellent compliance with treatment. She is not having side effects.   Wt Readings from Last 3 Encounters:  04/03/18 173 lb (78.5 kg)  01/02/18 170 lb (77.1 kg)  11/17/17 168 lb 6.4 oz (76.4 kg)    ------------------------------------------------------------------------    Allergies  Allergen Reactions  . Metformin Hcl     Weight Loss     Current Outpatient Medications:  .  ACCU-CHEK AVIVA PLUS test strip, CHECK BLOOD SUGAR THREE  TIMES DAILY, Disp: 300 each, Rfl: 4 .  amLODipine (NORVASC) 2.5 MG tablet, Take 1 tablet (2.5 mg total) by mouth daily., Disp: 30 tablet, Rfl: 3 .  aspirin (ASPIRIN ADULT LOW STRENGTH) 81 MG EC tablet, Take 81 mg by mouth daily. Swallow whole., Disp: , Rfl:  .  b complex vitamins tablet, Take 1 tablet by mouth daily., Disp: , Rfl:  .  Biotin 7500 MCG TABS, Take by mouth daily. , Disp: , Rfl:  .  Cholecalciferol (VITAMIN D3) 2000 UNITS capsule, Take 2,000 Units by mouth daily., Disp: , Rfl:  .  Cyanocobalamin (VITAMIN B 12 PO), Take 600 mg by mouth daily., Disp: , Rfl:  .  glimepiride (AMARYL) 4 MG tablet, TAKE ONE TABLET EVERY MORNING, Disp: 30 tablet, Rfl: 11 .  lovastatin (MEVACOR) 40 MG tablet, TAKE ONE (1) TABLET AT BEDTIME, Disp: 30 tablet, Rfl: 12 .  Omega-3 Fatty Acids (FISH OIL  BURP-LESS) 1200 MG CAPS, Take 1 capsule by mouth daily., Disp: , Rfl:  .  pioglitazone (ACTOS) 30 MG tablet, TAKE ONE (1) TABLET EACH DAY, Disp: 30 tablet, Rfl: 11 .  telmisartan-hydrochlorothiazide (MICARDIS HCT) 80-25 MG tablet, Take 1 tablet by mouth daily., Disp: 30 tablet, Rfl: 11  Review of Systems  Constitutional: Negative.   Respiratory: Negative.   Cardiovascular: Negative.   Gastrointestinal: Negative.   Endocrine: Negative.     Musculoskeletal: Negative.   Neurological: Negative for dizziness, light-headedness and headaches.    Social History   Tobacco Use  . Smoking status: Former Smoker    Packs/day: 2.00    Years: 20.00    Pack years: 40.00    Types: Cigarettes    Last attempt to quit: 1980    Years since quitting: 39.9  . Smokeless tobacco: Never Used  . Tobacco comment: smoked about 2 packs per day, smoked for about 20 years; Quit in 1980  Substance Use Topics  . Alcohol use: Yes    Alcohol/week: 0.0 standard drinks    Comment: occasional- 1/month (wine)      Objective:    Vitals:   04/03/18 1004 04/03/18 1018  BP: (!) 142/58 138/68  Pulse: 80   Resp: 16   Temp: 98.1 F (36.7 C)   TempSrc: Oral   Weight: 173 lb (78.5 kg)      Physical Exam   General Appearance:    Alert, cooperative, no distress  Eyes:    PERRL, conjunctiva/corneas clear, EOM's intact       Lungs:     Clear to auscultation bilaterally, respirations unlabored  Heart:    Regular rate and rhythm  Neurologic:   Awake, alert, oriented x 3. No apparent focal neurological           defect.       Results for orders placed or performed in visit on 04/03/18  POCT glycosylated hemoglobin (Hb A1C)  Result Value Ref Range   Hemoglobin A1C 7.5 (A) 4.0 - 5.6 %   Est. average glucose Bld gHb Est-mCnc 169   POCT UA - Microalbumin  Result Value Ref Range   Microalbumin Ur, POC 50 mg/L      Assessment & Plan    1. Type 2 diabetes mellitus with stage 3 chronic kidney disease, without long-term current use of insulin ([place]) a1c up a bit, work on reducing sugars in diet. Continue current medications.   - POCT glycosylated hemoglobin (Hb A1C) - POCT UA - Microalbumin  2. Essential hypertension Improved with addition of amlodipine. Continue current medications.    3. Hypercholesterolemia She is tolerating lovastatin well with no adverse effects.   - Comprehensive metabolic panel - Lipid panel  4. Chronic kidney disease,  stage III (moderate) (HCC)  - VITAMIN D 25 Hydroxy (Vit-D Deficiency, Fractures)  Follow up 4 months.     Lelon Huh, MD  [facility]

## 2018-04-04 ENCOUNTER — Telehealth: Payer: Self-pay

## 2018-04-04 LAB — VITAMIN D 25 HYDROXY (VIT D DEFICIENCY, FRACTURES): Vit D, 25-Hydroxy: 24 ng/mL — ABNORMAL LOW (ref 30.0–100.0)

## 2018-04-04 LAB — COMPREHENSIVE METABOLIC PANEL
ALK PHOS: 79 IU/L (ref 39–117)
ALT: 15 IU/L (ref 0–32)
AST: 18 IU/L (ref 0–40)
Albumin/Globulin Ratio: 1.6 (ref 1.2–2.2)
Albumin: 4.2 g/dL (ref 3.5–4.8)
BUN / CREAT RATIO: 32 — AB (ref 12–28)
BUN: 50 mg/dL — ABNORMAL HIGH (ref 8–27)
Bilirubin Total: <0.2 mg/dL (ref 0.0–1.2)
CO2: 15 mmol/L — AB (ref 20–29)
Calcium: 9.3 mg/dL (ref 8.7–10.3)
Chloride: 110 mmol/L — ABNORMAL HIGH (ref 96–106)
Creatinine, Ser: 1.57 mg/dL — ABNORMAL HIGH (ref 0.57–1.00)
GFR calc Af Amer: 37 mL/min/{1.73_m2} — ABNORMAL LOW (ref >59)
GFR calc non Af Amer: 32 mL/min/{1.73_m2} — ABNORMAL LOW (ref >59)
Globulin, Total: 2.7 g/dL (ref 1.5–4.5)
Glucose: 183 mg/dL — ABNORMAL HIGH (ref 65–99)
Potassium: 6 mmol/L — ABNORMAL HIGH (ref 3.5–5.2)
Sodium: 138 mmol/L (ref 134–144)
Total Protein: 6.9 g/dL (ref 6.0–8.5)

## 2018-04-04 LAB — LIPID PANEL
Chol/HDL Ratio: 3.8 ratio (ref 0.0–4.4)
Cholesterol, Total: 172 mg/dL (ref 100–199)
HDL: 45 mg/dL (ref >39)
LDL Calculated: 107 mg/dL — ABNORMAL HIGH (ref 0–99)
Triglycerides: 101 mg/dL (ref 0–149)
VLDL CHOLESTEROL CAL: 20 mg/dL (ref 5–40)

## 2018-04-04 MED ORDER — HYDROCHLOROTHIAZIDE 25 MG PO TABS: 25.0000 mg | ORAL_TABLET | Freq: Every day | ORAL | 2 refills | Status: DC

## 2018-04-04 NOTE — Telephone Encounter (Signed)
LMTCB 04/04/2018.  Thanks,   -Mickel Baas

## 2018-04-04 NOTE — Telephone Encounter (Signed)
-----   Message from Birdie Sons, MD sent at 04/04/2018  7:37 AM EST ----- Potassium level is much to high. Need to STOP Micardis-hctz, START hctz 25mg  daily, #30, rf x 2. Need to stop by for BP check and check renal panel in 7-8 days.  Rest of labs are good.

## 2018-04-04 NOTE — Telephone Encounter (Signed)
Pt returned missed call.  Please call pt after 3:20 pm today.  Thanks, [employer]

## 2018-04-04 NOTE — Telephone Encounter (Signed)
Pt advised.  RX sent to [facility]. ? ?Thanks,  ? ?-Laura  ?

## 2018-04-12 ENCOUNTER — Ambulatory Visit (INDEPENDENT_AMBULATORY_CARE_PROVIDER_SITE_OTHER): Payer: Medicare Other | Admitting: Family Medicine

## 2018-04-12 VITALS — BP 128/60

## 2018-04-12 DIAGNOSIS — E875 Hyperkalemia: Secondary | ICD-10-CM | POA: Diagnosis not present

## 2018-04-12 NOTE — Patient Instructions (Signed)
.   Please bring all of your medications to every appointment so we can make sure that our medication list is the same as yours.   

## 2018-04-12 NOTE — Progress Notes (Signed)
Pt comes in today for a blood pressure check.  She stopped Micardis/HCTZ and started HCTZ 25mg  daily due to elevated potassium on blood work 04/03/2018.  Pt's blood pressure today is 128/60.

## 2018-04-13 ENCOUNTER — Other Ambulatory Visit: Payer: Self-pay | Admitting: Family Medicine

## 2018-04-13 DIAGNOSIS — N184 Chronic kidney disease, stage 4 (severe): Secondary | ICD-10-CM

## 2018-04-13 LAB — RENAL FUNCTION PANEL
Albumin: 4.1 g/dL (ref 3.5–4.8)
BUN/Creatinine Ratio: 22 (ref 12–28)
BUN: 50 mg/dL — ABNORMAL HIGH (ref 8–27)
CO2: 16 mmol/L — ABNORMAL LOW (ref 20–29)
Calcium: 9 mg/dL (ref 8.7–10.3)
Chloride: 109 mmol/L — ABNORMAL HIGH (ref 96–106)
Creatinine, Ser: 2.3 mg/dL — ABNORMAL HIGH (ref 0.57–1.00)
GFR calc Af Amer: 23 mL/min/{1.73_m2} — ABNORMAL LOW (ref >59)
GFR, EST NON AFRICAN AMERICAN: 20 mL/min/{1.73_m2} — AB (ref >59)
Glucose: 186 mg/dL — ABNORMAL HIGH (ref 65–99)
PHOSPHORUS: 4.2 mg/dL (ref 2.5–4.5)
Potassium: 5.5 mmol/L — ABNORMAL HIGH (ref 3.5–5.2)
Sodium: 139 mmol/L (ref 134–144)

## 2018-04-19 ENCOUNTER — Other Ambulatory Visit: Payer: Self-pay | Admitting: Family Medicine

## 2018-04-19 DIAGNOSIS — I10 Essential (primary) hypertension: Secondary | ICD-10-CM

## 2018-04-19 MED ORDER — AMLODIPINE BESYLATE 2.5 MG PO TABS: 2.5000 mg | ORAL_TABLET | Freq: Every day | ORAL | 4 refills | Status: DC

## 2018-04-19 MED ORDER — LOVASTATIN 40 MG PO TABS: ORAL_TABLET | ORAL | 3 refills | Status: DC

## 2018-04-19 MED ORDER — PIOGLITAZONE HCL 30 MG PO TABS: ORAL_TABLET | ORAL | 4 refills | Status: DC

## 2018-04-19 MED ORDER — HYDROCHLOROTHIAZIDE 25 MG PO TABS: 25.0000 mg | ORAL_TABLET | Freq: Every day | ORAL | 3 refills | Status: DC

## 2018-04-19 MED ORDER — GLIMEPIRIDE 4 MG PO TABS: 4.0000 mg | ORAL_TABLET | Freq: Every morning | ORAL | 4 refills | Status: DC

## 2018-04-19 NOTE — Telephone Encounter (Signed)
Pt needs refills amLODipine (NORVASC) 2.5 MG tablet, pioglitazone (ACTOS) 30 MG tablet, glimepiride (AMARYL) 4 MG tablet, lovastatin (MEVACOR) 40 MG tablet and hydrochlorothiazide (HYDRODIURIL) 25 MG tablet sent [facility] [phone].  Pt needs 90 day supply of all.  Pt only has 3 days left and will need RX sent in today so they can have

## 2018-05-24 ENCOUNTER — Telehealth: Payer: Self-pay | Admitting: Family Medicine

## 2018-05-24 NOTE — Telephone Encounter (Signed)
-----   Message from Birdie Sons, MD sent at 05/16/2018  4:12 PM EST ----- Regarding: FW: make sure nephrology referral gets done.  ----- Message ----- From: Birdie Sons, MD Sent: 05/14/2018 To: Birdie Sons, MD Subject: FW: make sure nephrology referral gets done.    ----- Message ----- From: Birdie Sons, MD Sent: 04/20/2018 To: Birdie Sons, MD Subject: make sure nephrology referral gets done.

## 2018-05-24 NOTE — Telephone Encounter (Signed)
Can you see if patient was ever made an appointment see nephrologist. Referral was done in December and we haven't gotten any records from them yet. Thanks.

## 2018-05-24 NOTE — Telephone Encounter (Signed)
LMTCB 05/24/2018  Thanks,   -Laura  

## 2018-05-28 NOTE — Telephone Encounter (Signed)
LMTCB 05/28/2018  Thanks,   -Mickel Baas

## 2018-05-29 ENCOUNTER — Other Ambulatory Visit: Payer: Self-pay | Admitting: Family Medicine

## 2018-05-29 DIAGNOSIS — I10 Essential (primary) hypertension: Secondary | ICD-10-CM

## 2018-05-31 NOTE — Telephone Encounter (Signed)
Patient advised as below. Patient states he has an appointment to see a Nephrologist from [facility] on 07/10/2018 at 9:40am. I called [place] and confirmed that patient had appointment scheduled with Dr. Candiss Norse on that date.

## 2018-06-25 ENCOUNTER — Other Ambulatory Visit: Payer: Self-pay | Admitting: Family Medicine

## 2018-07-10 DIAGNOSIS — E1129 Type 2 diabetes mellitus with other diabetic kidney complication: Secondary | ICD-10-CM | POA: Diagnosis not present

## 2018-07-10 DIAGNOSIS — I129 Hypertensive chronic kidney disease with stage 1 through stage 4 chronic kidney disease, or unspecified chronic kidney disease: Secondary | ICD-10-CM | POA: Diagnosis not present

## 2018-07-10 DIAGNOSIS — N179 Acute kidney failure, unspecified: Secondary | ICD-10-CM | POA: Diagnosis not present

## 2018-07-10 DIAGNOSIS — N183 Chronic kidney disease, stage 3 (moderate): Secondary | ICD-10-CM | POA: Diagnosis not present

## 2018-07-10 DIAGNOSIS — R8281 Pyuria: Secondary | ICD-10-CM | POA: Diagnosis not present

## 2018-08-06 ENCOUNTER — Ambulatory Visit: Payer: Medicare Other | Admitting: Family Medicine

## 2018-08-08 ENCOUNTER — Other Ambulatory Visit: Payer: Self-pay | Admitting: Nephrology

## 2018-08-08 DIAGNOSIS — N183 Chronic kidney disease, stage 3 unspecified: Secondary | ICD-10-CM

## 2018-08-24 ENCOUNTER — Other Ambulatory Visit: Payer: Self-pay | Admitting: Family Medicine

## 2018-08-24 NOTE — Telephone Encounter (Signed)
Pharmacy requesting refills. Thanks!  

## 2018-09-12 ENCOUNTER — Other Ambulatory Visit: Payer: Self-pay

## 2018-09-12 ENCOUNTER — Encounter: Payer: Self-pay | Admitting: Family Medicine

## 2018-09-12 ENCOUNTER — Ambulatory Visit (INDEPENDENT_AMBULATORY_CARE_PROVIDER_SITE_OTHER): Payer: Medicare Other | Admitting: Family Medicine

## 2018-09-12 VITALS — BP 120/76 | HR 68 | Temp 98.4°F | Resp 16 | Ht 67.0 in | Wt 166.0 lb

## 2018-09-12 DIAGNOSIS — E1122 Type 2 diabetes mellitus with diabetic chronic kidney disease: Secondary | ICD-10-CM

## 2018-09-12 DIAGNOSIS — N183 Chronic kidney disease, stage 3 unspecified: Secondary | ICD-10-CM

## 2018-09-12 LAB — POCT GLYCOSYLATED HEMOGLOBIN (HGB A1C)
Est. average glucose Bld gHb Est-mCnc: 160
Hemoglobin A1C: 7.2 % — AB (ref 4.0–5.6)

## 2018-09-12 NOTE — Progress Notes (Signed)
Patient: Rachel Lester Female    DOB: 1940/04/20   78 y.o.   MRN: [id number] Visit Date: 09/12/2018  Today's Provider: Lelon Huh, MD   Chief Complaint  Patient presents with  . Diabetes  . Hyperlipidemia  . hyperkalemia   Subjective:     HPI  Diabetes Mellitus Type II, Follow-up:   Lab Results  Component Value Date   HGBA1C 7.2 (A) 09/12/2018   HGBA1C 7.5 (A) 04/03/2018   HGBA1C 6.8 (A) 01/02/2018    Last seen for diabetes 4 months ago.  Management since then includes no changes. She reports good compliance with treatment. She is not having side effects.  Current symptoms include none and have been stable. Home blood sugar records: fasting range: 160s  Episodes of hypoglycemia? no   Current Insulin Regimen: none Most Recent Eye Exam: up to date Weight trend: stable Prior visit with dietician: No Current exercise: no regular exercise Current diet habits: well balanced  Pertinent Labs:    Component Value Date/Time   CHOL 172 04/03/2018 1040   TRIG 101 04/03/2018 1040   HDL 45 04/03/2018 1040   LDLCALC 107 (H) 04/03/2018 1040   CREATININE 2.30 (H) 04/12/2018 1012   CREATININE 1.23 05/12/2013 0803    Wt Readings from Last 3 Encounters:  09/12/18 166 lb (75.3 kg)  04/03/18 173 lb (78.5 kg)  01/02/18 170 lb (77.1 kg)   Hyperkalemia/CKD follow up: Patient was last seen in the office 4 months ago. She was advised to stop micardis/HCTZ and start HCTZ 25mg  daily.   Is now being followed by Dr. Candiss Norse and has follow up scheduled next week.  BP Readings from Last 3 Encounters:  09/12/18 120/76  04/12/18 128/60  04/03/18 138/68    Lipid/Cholesterol, Follow-up:   Last seen for this4 months ago.  Management changes since that visit include no changes. . Last Lipid Panel:    Component Value Date/Time   CHOL 172 04/03/2018 1040   TRIG 101 04/03/2018 1040   HDL 45 04/03/2018 1040   CHOLHDL 3.8 04/03/2018 1040   LDLCALC 107 (H) 04/03/2018 1040     Risk factors for vascular disease include diabetes mellitus  She reports good compliance with treatment. She is not having side effects.  Current symptoms include none and have been stable.   Allergies  Allergen Reactions  . Metformin Hcl     Weight Loss     Current Outpatient Medications:  .  ACCU-CHEK AVIVA PLUS test strip, CHECK BLOOD SUGAR THREE  TIMES DAILY, Disp: 300 each, Rfl: 4 .  amLODipine (NORVASC) 2.5 MG tablet, TAKE 1 TABLET BY MOUTH DAILY, Disp: 30 tablet, Rfl: 11 .  aspirin (ASPIRIN ADULT LOW STRENGTH) 81 MG EC tablet, Take 81 mg by mouth daily. Swallow whole., Disp: , Rfl:  .  b complex vitamins tablet, Take 1 tablet by mouth daily., Disp: , Rfl:  .  Cyanocobalamin (VITAMIN B 12 PO), Take 600 mg by mouth daily., Disp: , Rfl:  .  glimepiride (AMARYL) 4 MG tablet, TAKE 1 TABLET BY MOUTH EVERY MORNING, Disp: 30 tablet, Rfl: 12 .  hydrochlorothiazide (HYDRODIURIL) 25 MG tablet, TAKE 1 TABLET(25 MG) BY MOUTH DAILY, Disp: 30 tablet, Rfl: 12 .  lovastatin (MEVACOR) 40 MG tablet, TAKE ONE (1) TABLET AT BEDTIME, Disp: 90 tablet, Rfl: 3 .  pioglitazone (ACTOS) 30 MG tablet, TAKE 1 TABLET BY MOUTH EVERY DAY, Disp: 30 tablet, Rfl: 12 .  Biotin 7500 MCG TABS, Take by mouth  daily. , Disp: , Rfl:  .  Cholecalciferol (VITAMIN D3) 2000 UNITS capsule, Take 2,000 Units by mouth daily., Disp: , Rfl:  .  Omega-3 Fatty Acids (FISH OIL BURP-LESS) 1200 MG CAPS, Take 1 capsule by mouth daily., Disp: , Rfl:   Review of Systems  Constitutional: Negative for activity change, appetite change, chills, diaphoresis, fatigue, fever and unexpected weight change.  Respiratory: Negative for cough and shortness of breath.   Cardiovascular: Negative for chest pain, palpitations and leg swelling.  Endocrine: Negative for cold intolerance, heat intolerance, polydipsia, polyphagia and polyuria.  Musculoskeletal: Negative for arthralgias, myalgias and neck pain.  Skin: Negative.    Allergic/Immunologic: Negative for environmental allergies.  Neurological: Negative for dizziness, tremors, weakness, light-headedness and headaches.  Psychiatric/Behavioral: Negative for agitation, self-injury, sleep disturbance and suicidal ideas. The patient is not nervous/anxious.     Social History   Tobacco Use  . Smoking status: Former Smoker    Packs/day: 2.00    Years: 20.00    Pack years: 40.00    Types: Cigarettes    Last attempt to quit: 1980    Years since quitting: 40.4  . Smokeless tobacco: Never Used  . Tobacco comment: smoked about 2 packs per day, smoked for about 20 years; Quit in 1980  Substance Use Topics  . Alcohol use: Yes    Alcohol/week: 0.0 standard drinks    Comment: occasional- 1/month (wine)      Objective:   BP 120/76 (BP Location: Right Arm, Patient Position: Sitting, Cuff Size: Normal)   Pulse 68   Temp 98.4 F (36.9 C)   Resp 16   Ht 5\' 7"  (1.702 m)   Wt 166 lb (75.3 kg)   SpO2 100%   BMI 26.00 kg/m  Vitals:   09/12/18 1046  BP: 120/76  Pulse: 68  Resp: 16  Temp: 98.4 F (36.9 C)  SpO2: 100%  Weight: 166 lb (75.3 kg)  Height: 5\' 7"  (1.702 m)     Physical Exam  General appearance: alert, well developed, well nourished, cooperative and in no distress Head: Normocephalic, without obvious abnormality, atraumatic Respiratory: Respirations even and unlabored, normal respiratory rate Extremities: No gross deformities Skin: Skin color, texture, turgor normal. No rashes seen  Psych: Appropriate mood and affect. Neurologic: Mental status: Alert, oriented to person, place, and time, thought content appropriate.  Results for orders placed or performed in visit on 09/12/18  POCT glycosylated hemoglobin (Hb A1C)  Result Value Ref Range   Hemoglobin A1C 7.2 (A) 4.0 - 5.6 %   HbA1c POC (<> result, manual entry)     HbA1c, POC (prediabetic range)     HbA1c, POC (controlled diabetic range)     Est. average glucose Bld gHb Est-mCnc 160         Assessment & Plan     1. Type 2 diabetes mellitus with stage 3 chronic kidney disease, without long-term current use of insulin ([place]) Improving on current medications goal is to get a1c<7. Encouraged to work on diet  - POCT glycosylated hemoglobin (Hb A1C)  2. Chronic kidney disease, stage III (moderate) (HCC) Follow up Dr. Candiss Norse as scheduled.      Lelon Huh, MD  [facility]

## 2018-09-12 NOTE — Patient Instructions (Signed)
.   Please review the attached list of medications and notify my office if there are any errors.   . Please bring all of your medications to every appointment so we can make sure that our medication list is the same as yours.   

## 2018-09-19 ENCOUNTER — Ambulatory Visit
Admission: RE | Admit: 2018-09-19 | Discharge: 2018-09-19 | Disposition: A | Discharge status: Home / Self Care | Payer: Medicare Other | Source: Ambulatory Visit | Attending: Nephrology | Admitting: Nephrology

## 2018-09-19 ENCOUNTER — Other Ambulatory Visit: Payer: Self-pay

## 2018-09-19 DIAGNOSIS — N183 Chronic kidney disease, stage 3 unspecified: Secondary | ICD-10-CM

## 2018-11-05 DIAGNOSIS — E1129 Type 2 diabetes mellitus with other diabetic kidney complication: Secondary | ICD-10-CM | POA: Diagnosis not present

## 2018-11-05 DIAGNOSIS — N183 Chronic kidney disease, stage 3 (moderate): Secondary | ICD-10-CM | POA: Diagnosis not present

## 2018-11-05 DIAGNOSIS — I129 Hypertensive chronic kidney disease with stage 1 through stage 4 chronic kidney disease, or unspecified chronic kidney disease: Secondary | ICD-10-CM | POA: Diagnosis not present

## 2018-11-05 DIAGNOSIS — R8281 Pyuria: Secondary | ICD-10-CM | POA: Diagnosis not present

## 2018-11-06 ENCOUNTER — Other Ambulatory Visit: Payer: Self-pay | Admitting: Family Medicine

## 2018-11-22 ENCOUNTER — Telehealth: Payer: Self-pay | Admitting: Family Medicine

## 2018-11-22 NOTE — Telephone Encounter (Signed)
Pt returned missed call.  Please call pt back. ° °Thanks, °TGH °

## 2018-11-22 NOTE — Telephone Encounter (Signed)
Left message.Marland KitchenMarland KitchenPt needs to be screened for her appointment tomorrow.     Thanks,   -Mickel Baas

## 2018-11-22 NOTE — Telephone Encounter (Signed)
Pre-screening completed. Thanks TNP

## 2018-11-23 ENCOUNTER — Ambulatory Visit (INDEPENDENT_AMBULATORY_CARE_PROVIDER_SITE_OTHER): Payer: Medicare Other | Admitting: Family Medicine

## 2018-11-23 ENCOUNTER — Ambulatory Visit: Payer: Medicare Other

## 2018-11-23 ENCOUNTER — Other Ambulatory Visit: Payer: Self-pay

## 2018-11-23 ENCOUNTER — Encounter: Payer: Self-pay | Admitting: Family Medicine

## 2018-11-23 VITALS — BP 138/72 | HR 73 | Temp 98.5°F | Ht 66.0 in | Wt 165.0 lb

## 2018-11-23 DIAGNOSIS — E7800 Pure hypercholesterolemia, unspecified: Secondary | ICD-10-CM | POA: Diagnosis not present

## 2018-11-23 DIAGNOSIS — E1122 Type 2 diabetes mellitus with diabetic chronic kidney disease: Secondary | ICD-10-CM | POA: Diagnosis not present

## 2018-11-23 DIAGNOSIS — I10 Essential (primary) hypertension: Secondary | ICD-10-CM | POA: Diagnosis not present

## 2018-11-23 DIAGNOSIS — N183 Chronic kidney disease, stage 3 unspecified: Secondary | ICD-10-CM

## 2018-11-23 DIAGNOSIS — Z0000 Encounter for general adult medical examination without abnormal findings: Secondary | ICD-10-CM | POA: Diagnosis not present

## 2018-11-23 NOTE — Progress Notes (Signed)
Patient: Rachel Lester, Female    DOB: 12-Apr-1941, 78 y.o.   MRN: [id number] Visit Date: 11/23/2018  Today's Provider: Lelon Huh, MD   Chief Complaint  Patient presents with  . Annual Exam   Subjective:     Complete Physical Rachel Lester is a 78 y.o. female. She feels well. She reports exercising regularly. She reports she is sleeping well.  -----------------------------------------------------------  Diabetes Mellitus Type II, Follow-up:   Lab Results  Component Value Date   HGBA1C 7.2 (A) 09/12/2018   HGBA1C 7.5 (A) 04/03/2018   HGBA1C 6.8 (A) 01/02/2018     She reports excellent compliance with treatment. She is not having side effects.   Home blood sugar records: fasting range: low to mid 100s.   Episodes of hypoglycemia? no   ------------------------------------------------------------------------   Hypertension, follow-up:  BP Readings from Last 3 Encounters:  11/23/18 138/72  09/12/18 120/76  04/12/18 128/60    .She reports excellent compliance with treatment. She is not having side effects.  She is not exercising. She is adherent to low salt diet.    ------------------------------------------------------------------------    Lipid/Cholesterol, Follow-up:     Last Lipid Panel:    Component Value Date/Time   CHOL 172 04/03/2018 1040   TRIG 101 04/03/2018 1040   HDL 45 04/03/2018 1040   CHOLHDL 3.8 04/03/2018 1040   LDLCALC 107 (H) 04/03/2018 1040    She reports excellent compliance with treatment. She is not having side effects.   Wt Readings from Last 3 Encounters:  11/23/18 165 lb (74.8 kg)  09/12/18 166 lb (75.3 kg)  04/03/18 173 lb (78.5 kg)    ------------------------------------------------------------------------  She also continues follow up with Dr. Candiss Norse for stage three CKD which seems to have stablilized.   Review of Systems  Constitutional: Negative.   HENT: Negative.   Eyes: Negative.    Respiratory: Negative.   Cardiovascular: Negative.   Gastrointestinal: Negative.   Endocrine: Negative.   Genitourinary: Negative.   Musculoskeletal: Negative.   Skin: Negative.   Allergic/Immunologic: Negative.   Neurological: Negative.   Hematological: Negative.   Psychiatric/Behavioral: Negative.     Social History   Socioeconomic History  . Marital status: Widowed    Spouse name: Not on file  . Number of children: 2  . Years of education: Not on file  . Highest education level: 12th grade  Occupational History  . Occupation: retired  Scientific laboratory technician  . Financial resource strain: Not hard at all  . Food insecurity    Worry: Never true    Inability: Never true  . Transportation needs    Medical: No    Non-medical: No  Tobacco Use  . Smoking status: Former Smoker    Packs/day: 2.00    Years: 20.00    Pack years: 40.00    Types: Cigarettes    Quit date: 1980    Years since quitting: 40.6  . Smokeless tobacco: Never Used  . Tobacco comment: smoked about 2 packs per day, smoked for about 20 years; Quit in 1980  Substance and Sexual Activity  . Alcohol use: Yes    Alcohol/week: 0.0 standard drinks    Comment: occasional- 1/month (wine)  . Drug use: No  . Sexual activity: Not on file  Lifestyle  . Physical activity    Days per week: Not on file    Minutes per session: Not on file  . Stress: Not at all  Relationships  . Social connections  Talks on phone: Not on file    Gets together: Not on file    Attends religious service: Not on file    Active member of club or organization: Not on file    Attends meetings of clubs or organizations: Not on file    Relationship status: Not on file  . Intimate partner violence    Fear of current or ex partner: Not on file    Emotionally abused: Not on file    Physically abused: Not on file    Forced sexual activity: Not on file  Other Topics Concern  . Not on file  Social History Narrative  . Not on file    Past  Medical History:  Diagnosis Date  . Diabetes mellitus without complication ([place])    type 2  . Hyperlipidemia   . Hypertension      Patient Active Problem List   Diagnosis Date Noted  . Abnormal ankle brachial index (ABI) 09/29/2016  . Diverticulosis 12/30/2014  . Diabetes mellitus with chronic kidney disease ([place]) 12/30/2014  . Hypercholesterolemia 12/30/2014  . Hyperkalemia 12/30/2014  . Hypertension 12/30/2014  . Chronic kidney disease, stage III (moderate) ([place]) 12/30/2014  . Hematuria 12/30/2014  . Microalbuminuria 12/30/2014    History reviewed. No pertinent surgical history.  Her family history includes Alzheimer's disease in her mother; Arthritis in her sister; Coronary artery disease in her father; Diabetes in her brother and sister.   Current Outpatient Medications:  .  ACCU-CHEK AVIVA PLUS test strip, CHECK BLOOD SUGAR THREE  TIMES DAILY, Disp: 300 strip, Rfl: 4 .  amLODipine (NORVASC) 2.5 MG tablet, TAKE 1 TABLET BY MOUTH DAILY, Disp: 30 tablet, Rfl: 11 .  aspirin (ASPIRIN ADULT LOW STRENGTH) 81 MG EC tablet, Take 81 mg by mouth daily. Swallow whole., Disp: , Rfl:  .  b complex vitamins tablet, Take 1 tablet by mouth daily., Disp: , Rfl:  .  Biotin 7500 MCG TABS, Take by mouth daily. , Disp: , Rfl:  .  Cholecalciferol (VITAMIN D3) 2000 UNITS capsule, Take 2,000 Units by mouth daily., Disp: , Rfl:  .  Cyanocobalamin (VITAMIN B 12 PO), Take 600 mg by mouth daily., Disp: , Rfl:  .  glimepiride (AMARYL) 4 MG tablet, TAKE 1 TABLET BY MOUTH EVERY MORNING, Disp: 30 tablet, Rfl: 12 .  hydrochlorothiazide (HYDRODIURIL) 25 MG tablet, TAKE 1 TABLET(25 MG) BY MOUTH DAILY, Disp: 30 tablet, Rfl: 12 .  lovastatin (MEVACOR) 40 MG tablet, TAKE ONE (1) TABLET AT BEDTIME, Disp: 90 tablet, Rfl: 3 .  pioglitazone (ACTOS) 30 MG tablet, TAKE 1 TABLET BY MOUTH EVERY DAY, Disp: 30 tablet, Rfl: 12 .  Omega-3 Fatty Acids (FISH OIL BURP-LESS) 1200 MG CAPS, Take 1 capsule by mouth daily., Disp: ,  Rfl:   Patient Care Team: Birdie Sons, MD as PCP - General (Family Medicine) Birder Robson, MD as Referring Physician (Ophthalmology) Murlean Iba, MD (Nephrology)     Objective:    Vitals: BP 138/72 (BP Location: Right Arm, Patient Position: Sitting, Cuff Size: Normal)   Pulse 73   Temp 98.5 F (36.9 C) (Oral)   Ht 5\' 6"  (1.676 m)   Wt 165 lb (74.8 kg)   BMI 26.63 kg/m   Physical Exam   General Appearance:    Alert, cooperative, no distress, appears stated age  Head:    Normocephalic, without obvious abnormality, atraumatic  Eyes:    PERRL, conjunctiva/corneas clear, EOM's intact, fundi    benign, both eyes  Ears:  Normal TM's and external ear canals, both ears  Nose:   Nares normal, septum midline, mucosa normal, no drainage    or sinus tenderness  Throat:   Lips, mucosa, and tongue normal; teeth and gums normal  Neck:   Supple, symmetrical, trachea midline, no adenopathy;    thyroid:  no enlargement/tenderness/nodules; no carotid   bruit or JVD  Back:     Symmetric, no curvature, ROM normal, no CVA tenderness  Lungs:     Clear to auscultation bilaterally, respirations unlabored  Chest Wall:    No tenderness or deformity   Heart:    Normal heart rate. Normal rhythm. No murmurs, rubs, or gallops.   Breast Exam:    patient declined  Abdomen:     Soft, non-tender, bowel sounds active all four quadrants,    no masses, no organomegaly  Pelvic:    deferred  Extremities:   Extremities normal, atraumatic, no cyanosis or edema  Pulses:   2+ and symmetric all extremities  Skin:   Skin color, texture, turgor normal, no rashes or lesions  Lymph nodes:   Cervical, supraclavicular, and axillary nodes normal  Neurologic:   CNII-XII intact, normal strength, sensation and reflexes    throughout       Assessment & Plan:    Annual Physical Reviewed patient's Family Medical History Reviewed and updated list of patient's medical providers Assessment of cognitive  impairment was done Assessed patient's functional ability Established a written schedule for health screening services Health Risk Assessent Completed and Reviewed  Exercise Activities and Dietary recommendations Goals    . Exercise 3x per week (30 min per time)     Recommend increasing time walking on treadmill to 20-30 minutes at a time vs 10 minutes.     . Increase water intake     Recommend increasing water intake to 6 glasses a day.        Immunization History  Administered Date(s) Administered  . Influenza, High Dose Seasonal PF 01/12/2015, 03/29/2016, 01/31/2017, 01/02/2018  . Pneumococcal Conjugate-13 10/07/2013  . Pneumococcal Polysaccharide-23 02/17/2004, 02/09/2011  . Td 09/01/2006  . Tdap 09/29/2016  . Zoster 04/09/2009    Health Maintenance  Topic Date Due  . FOOT EXAM  09/29/2017  . OPHTHALMOLOGY EXAM  10/20/2018  . INFLUENZA VACCINE  11/17/2018  . HEMOGLOBIN A1C  03/15/2019  . TETANUS/TDAP  09/30/2026  . DEXA SCAN  Completed  . PNA vac Low Risk Adult  Completed     Discussed health benefits of physical activity, and encouraged her to engage in regular exercise appropriate for her age and condition.    ------------------------------------------------------------------------------------------------------------  1. Annual physical exam   2. Essential hypertension Well controlled.  Continue current medications.    3. Type 2 diabetes mellitus with stage 3 chronic kidney disease, without long-term current use of insulin (HCC)  - Hemoglobin A1c  4. Hypercholesterolemia She is tolerating lovastatin well with no adverse effects.   - Lipid panel   The entirety of the information documented in the History of Present Illness, Review of Systems and Physical Exam were personally obtained by me. Portions of this information were initially documented by Ashley Royalty, CMA and reviewed by me for thoroughness and accuracy.    Lelon Huh, MD  [facility]

## 2018-11-23 NOTE — Patient Instructions (Addendum)
.   Please review the attached list of medications and notify my office if there are any errors.   . Please bring all of your medications to every appointment so we can make sure that our medication list is the same as yours.   . We will have flu vaccines available after Labor Day. Please go to your pharmacy or call the office in early September to schedule you flu shot.   The CDC recommends two doses of Shingrix (the shingles vaccine) separated by 2 to 6 months for adults age 77 years and older. I recommend checking with your insurance plan regarding coverage for this vaccine.

## 2018-11-24 LAB — LIPID PANEL
Chol/HDL Ratio: 3.2 ratio (ref 0.0–4.4)
Cholesterol, Total: 172 mg/dL (ref 100–199)
HDL: 53 mg/dL (ref >39)
LDL Calculated: 99 mg/dL (ref 0–99)
Triglycerides: 100 mg/dL (ref 0–149)
VLDL Cholesterol Cal: 20 mg/dL (ref 5–40)

## 2018-11-24 LAB — HEMOGLOBIN A1C
Est. average glucose Bld gHb Est-mCnc: 174 mg/dL
Hgb A1c MFr Bld: 7.7 % — ABNORMAL HIGH (ref 4.8–5.6)

## 2018-11-26 ENCOUNTER — Telehealth: Payer: Self-pay

## 2018-11-26 NOTE — Telephone Encounter (Signed)
-----   Message from Birdie Sons, MD sent at 11/24/2018  9:25 AM EDT ----- a1c Is up a bit to 7.7. cholesterol is well controlled at 172. Try to cut back on sweets or starches. Continue current medications.  Schedule diabetes follow up 4-5 months

## 2018-11-26 NOTE — Telephone Encounter (Signed)
Patient advised, follow up scheduled for 03/29/19.KW

## 2018-12-04 ENCOUNTER — Ambulatory Visit: Payer: Medicare Other | Admitting: Surgery

## 2018-12-04 ENCOUNTER — Other Ambulatory Visit: Payer: Self-pay

## 2018-12-04 ENCOUNTER — Encounter: Payer: Self-pay | Admitting: Surgery

## 2018-12-04 VITALS — BP 130/49 | HR 87 | Temp 97.9°F | Ht 66.5 in | Wt 168.0 lb

## 2018-12-04 DIAGNOSIS — D179 Benign lipomatous neoplasm, unspecified: Secondary | ICD-10-CM

## 2018-12-04 NOTE — Patient Instructions (Addendum)
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  Please stop your Aspirin 5 days prior to your procedure on 12/25/18.

## 2018-12-05 ENCOUNTER — Encounter: Payer: Self-pay | Admitting: Surgery

## 2018-12-05 NOTE — Progress Notes (Signed)
12/05/2018  Reason for Visit:  Masses in bilateral upper extremities  History of Present Illness: Rachel Lester is a 78 y.o. female presenting for evaluation of masses of bilateral upper extremities.  She reports particularly on the right arm, the masses started a few years ago but have been getting bigger.  The masses do not cause any particular symptoms, but she does not like how bulging the masses are on the right forearm.  On the left arm, the masses are somewhat more hidden in the upper arm fat.  She denies having any significant pain, any erythema over the masses, any drainage, or skin induration.  Past Medical History: Past Medical History:  Diagnosis Date  . Diabetes mellitus without complication ([place])    type 2  . Hyperlipidemia    Chronic kidney disease   . Hypertension      Past Surgical History: History reviewed. No pertinent surgical history.  Home Medications: Prior to Admission medications   Medication Sig Start Date End Date Taking? Authorizing Provider  ACCU-CHEK AVIVA PLUS test strip CHECK BLOOD SUGAR THREE  TIMES DAILY 11/06/18  Yes Birdie Sons, MD  amLODipine (NORVASC) 2.5 MG tablet TAKE 1 TABLET BY MOUTH DAILY 05/29/18  Yes Birdie Sons, MD  aspirin (ASPIRIN ADULT LOW STRENGTH) 81 MG EC tablet Take 81 mg by mouth daily. Swallow whole.   Yes [provider]  b complex vitamins tablet Take 1 tablet by mouth daily.   Yes [provider]  Biotin 7500 MCG TABS Take by mouth daily.    Yes [provider]  Cholecalciferol (VITAMIN D3) 2000 UNITS capsule Take 2,000 Units by mouth daily.   Yes [provider]  Cyanocobalamin (VITAMIN B 12 PO) Take 600 mg by mouth daily.   Yes [provider]  glimepiride (AMARYL) 4 MG tablet TAKE 1 TABLET BY MOUTH EVERY MORNING 06/25/18  Yes Birdie Sons, MD  hydrochlorothiazide (HYDRODIURIL) 25 MG tablet TAKE 1 TABLET(25 MG) BY MOUTH DAILY 06/25/18  Yes Birdie Sons, MD   lovastatin (MEVACOR) 40 MG tablet TAKE ONE (1) TABLET AT BEDTIME 04/19/18  Yes Birdie Sons, MD  Omega-3 Fatty Acids (FISH OIL BURP-LESS) 1200 MG CAPS Take 1 capsule by mouth daily.   Yes [provider]  pioglitazone (ACTOS) 30 MG tablet TAKE 1 TABLET BY MOUTH EVERY DAY 08/24/18  Yes Birdie Sons, MD    Allergies: Allergies  Allergen Reactions  . Metformin Hcl     Weight Loss-anorexia     Social History:  reports that she quit smoking about 40 years ago. Her smoking use included cigarettes. She has a 40.00 pack-year smoking history. She has never used smokeless tobacco. She reports current alcohol use. She reports that she does not use drugs.   Family History: Family History  Problem Relation Age of Onset  . Alzheimer's disease Mother   . Coronary artery disease Father   . Arthritis Sister        Lupus  . Diabetes Brother        Type 2  . Diabetes Sister        Type 2  . Cancer Sister     Review of Systems: Review of Systems  Constitutional: Negative for chills and fever.  HENT: Negative for hearing loss.   Respiratory: Negative for shortness of breath.   Cardiovascular: Negative for chest pain.  Gastrointestinal: Negative for abdominal pain, nausea and vomiting.  Genitourinary: Negative for dysuria.  Musculoskeletal: Negative for myalgias.  Skin: Negative for rash.  Neurological: Negative for dizziness.  Psychiatric/Behavioral: Negative for depression.    Physical Exam BP (!) 130/49   Pulse 87   Temp 97.9 F (36.6 C)   Ht 5' 6.5" (1.689 m)   Wt 168 lb (76.2 kg)   SpO2 96%   BMI 26.71 kg/m  CONSTITUTIONAL: No acute distress HEENT:  Normocephalic, atraumatic, extraocular motion intact. NECK: Trachea is midline, and there is no jugular venous distension.  RESPIRATORY:  Lungs are clear, and breath sounds are equal bilaterally. Normal respiratory effort without pathologic use of accessory muscles. CARDIOVASCULAR: Heart is regular without murmurs,  gallops, or rubs. GI: The abdomen is soft, non-distended, non-tender.  MUSCULOSKELETAL:  Normal muscle strength and tone in all four extremities.  No peripheral edema or cyanosis. SKIN: Patient has two small mobile masses in the right forearm, dorsal side.  One measures about 2 cm and the other about 1 cm.  They are mobile, nontender, without any erythema, induration, or drainage.  In the left upper arm, there are two small masses in the ventral side of the upper arm, just distal to the axilla.  These are also mobile, nontender, with no erythema or drainage.  NEUROLOGIC:  Motor and sensation is grossly normal.  Cranial nerves are grossly intact. PSYCH:  Alert and oriented to person, place and time. Affect is normal.  Laboratory Analysis: No results found for this or any previous visit (from the past 24 hour(s)).  Imaging: No results found.  Assessment and Plan: This is a 78 y.o. female with multiple masses of bilateral upper extermities.  Discussed with patient that these masses could be lipomas, and that they are vastly benign, particularly noting the slow rate of growth.  She is interested in removing them, and after discussing doing this in the OR or in office, she has opted for excision of the right forearm masses first in the office.  We will set her up for a 1 hr procedure slot next month.  I discussed with the patient that I would not be available for the next two weeks and she is in agreement to wait until next month for excision.  She will be scheduled for 12/25/18.  She knows to stop her ASA 5 days prior to procedure.  Face-to-face time spent with the patient and care providers was 45 minutes, with more than 50% of the time spent counseling, educating, and coordinating care of the patient.     Melvyn Neth, [facility]

## 2018-12-07 DIAGNOSIS — Z1231 Encounter for screening mammogram for malignant neoplasm of breast: Secondary | ICD-10-CM | POA: Diagnosis not present

## 2018-12-07 LAB — HM MAMMOGRAPHY

## 2018-12-12 ENCOUNTER — Encounter: Payer: Self-pay | Admitting: Family Medicine

## 2018-12-13 NOTE — Progress Notes (Signed)
Subjective:   Rachel Lester is a 78 y.o. female who presents for Medicare Annual (Subsequent) preventive examination.    This visit is being conducted through telemedicine due to the COVID-19 pandemic. This patient has given me verbal consent via doximity to conduct this visit, patient states they are participating from their home address. Some vital signs may be absent or patient reported.    Patient identification: identified by name, DOB, and current address  Review of Systems:  N/A  Cardiac Risk Factors include: advanced age (>51men, >54 women);diabetes mellitus;dyslipidemia;hypertension     Objective:     Vitals: There were no vitals taken for this visit.  There is no height or weight on file to calculate BMI. Unable to obtain vitals due to visit being conducted via telephonically.   Advanced Directives 12/17/2018 11/17/2017 11/16/2016 01/12/2015  Does Patient Have a Medical Advance Directive? No No No No  Would patient like information on creating a medical advance directive? No - Patient declined Yes (MAU/Ambulatory/Procedural Areas - Information given) No - Patient declined -    Tobacco Social History   Tobacco Use  Smoking Status Former Smoker  . Packs/day: 2.00  . Years: 20.00  . Pack years: 40.00  . Types: Cigarettes  . Quit date: 51  . Years since quitting: 40.6  Smokeless Tobacco Never Used  Tobacco Comment   smoked about 2 packs per day, smoked for about 20 years; Quit in [place] given: Not Answered Comment: smoked about 2 packs per day, smoked for about 20 years; Quit in 1980   Clinical Intake:  Pre-visit preparation completed: Yes  Pain : No/denies pain Pain Score: 0-No pain     Nutritional Risks: None Diabetes: Yes  How often do you need to have someone help you when you read instructions, pamphlets, or other written materials from your doctor or pharmacy?: 1 - Never   Diabetes:  Is the patient diabetic?  Yes type 2  If  diabetic, was a CBG obtained today?  No  Did the patient bring in their glucometer from home?  No  How often do you monitor your CBG's? Once a day.   Financial Strains and Diabetes Management:  Are you having any financial strains with the device, your supplies or your medication? No .  Does the patient want to be seen by Chronic Care Management for management of their diabetes?  No  Would the patient like to be referred to a Nutritionist or for Diabetic Management?  No   Diabetic Exams:  Diabetic Eye Exam: Completed 10/19/17. Overdue for diabetic eye exam. Pt has been advised about the importance in completing this exam. Apt scheduled for 12/25/18.  Diabetic Foot Exam: Completed 09/29/16. Pt has been advised about the importance in completing this exam. Note made to follow up on this at next in office visit.     Interpreter Needed?: No  Information entered by :: [facility], LPN  Past Medical History:  Diagnosis Date  . Diabetes mellitus without complication ([place])    type 2  . Hyperlipidemia   . Hypertension    History reviewed. No pertinent surgical history. Family History  Problem Relation Age of Onset  . Alzheimer's disease Mother   . Coronary artery disease Father   . Arthritis Sister        Lupus  . Diabetes Brother        Type 2  . Diabetes Sister        Type 2  .  Cancer Sister    Social History   Socioeconomic History  . Marital status: Widowed    Spouse name: Not on file  . Number of children: 2  . Years of education: Not on file  . Highest education level: 12th grade  Occupational History  . Occupation: retired  Scientific laboratory technician  . Financial resource strain: Not hard at all  . Food insecurity    Worry: Never true    Inability: Never true  . Transportation needs    Medical: No    Non-medical: No  Tobacco Use  . Smoking status: Former Smoker    Packs/day: 2.00    Years: 20.00    Pack years: 40.00    Types: Cigarettes    Quit date: 1980    Years since  quitting: 40.6  . Smokeless tobacco: Never Used  . Tobacco comment: smoked about 2 packs per day, smoked for about 20 years; Quit in 1980  Substance and Sexual Activity  . Alcohol use: Yes    Alcohol/week: 0.0 standard drinks    Comment: occasional- 1/month (wine)  . Drug use: No  . Sexual activity: Not on file  Lifestyle  . Physical activity    Days per week: 0 days    Minutes per session: 0 min  . Stress: Not at all  Relationships  . Social Herbalist on phone: Patient refused    Gets together: Patient refused    Attends religious service: Patient refused    Active member of club or organization: Patient refused    Attends meetings of clubs or organizations: Patient refused    Relationship status: Patient refused  Other Topics Concern  . Not on file  Social History Narrative  . Not on file    Outpatient Encounter Medications as of 12/17/2018  Medication Sig  . ACCU-CHEK AVIVA PLUS test strip CHECK BLOOD SUGAR THREE  TIMES DAILY  . amLODipine (NORVASC) 2.5 MG tablet TAKE 1 TABLET BY MOUTH DAILY  . aspirin (ASPIRIN ADULT LOW STRENGTH) 81 MG EC tablet Take 81 mg by mouth daily. Swallow whole.  . b complex vitamins tablet Take 1 tablet by mouth daily.  . Cholecalciferol (VITAMIN D3) 2000 UNITS capsule Take 2,000 Units by mouth daily.  . Cyanocobalamin (VITAMIN B 12 PO) Take 600 mg by mouth daily.  Marland Kitchen glimepiride (AMARYL) 4 MG tablet TAKE 1 TABLET BY MOUTH EVERY MORNING  . hydrochlorothiazide (HYDRODIURIL) 25 MG tablet TAKE 1 TABLET(25 MG) BY MOUTH DAILY  . lovastatin (MEVACOR) 40 MG tablet TAKE ONE (1) TABLET AT BEDTIME  . pioglitazone (ACTOS) 30 MG tablet TAKE 1 TABLET BY MOUTH EVERY DAY  . vitamin C (ASCORBIC ACID) 500 MG tablet Take 500 mg by mouth daily.  . Biotin 7500 MCG TABS Take by mouth daily.   . Omega-3 Fatty Acids (FISH OIL BURP-LESS) 1200 MG CAPS Take 1 capsule by mouth daily.   No facility-administered encounter medications on file as of 12/17/2018.      Activities of Daily Living In your present state of health, do you have any difficulty performing the following activities: 12/17/2018 11/23/2018  Hearing? N N  Vision? N N  Difficulty concentrating or making decisions? N N  Walking or climbing stairs? N N  Dressing or bathing? N N  Doing errands, shopping? N N  Preparing Food and eating ? N -  Using the Toilet? N -  In the past six months, have you accidently leaked urine? N -  Do you have  problems with loss of bowel control? N -  Managing your Medications? N -  Managing your Finances? N -  Housekeeping or managing your Housekeeping? N -  Some recent data might be hidden    Patient Care Team: Birdie Sons, MD as PCP - General (Family Medicine) Birder Robson, MD as Referring Physician (Ophthalmology) Murlean Iba, MD (Nephrology) Olean Ree, MD as Consulting Physician (General Surgery)    Assessment:   This is a routine wellness examination for Ayla.  Exercise Activities and Dietary recommendations Current Exercise Habits: The patient does not participate in regular exercise at present, Exercise limited by: None identified  Goals    . Exercise 3x per week (30 min per time)     Recommend increasing time walking on treadmill to 20-30 minutes at a time vs 10 minutes.     . Increase water intake     Recommend increasing water intake to 6 glasses a day.        Fall Risk: Fall Risk  12/17/2018 11/23/2018 11/17/2017 11/16/2016 03/29/2016  Falls in the past year? 0 0 No No No  Follow up - Falls evaluation completed - - -    FALL RISK PREVENTION PERTAINING TO THE HOME:  Any stairs in or around the home? Yes  If so, are there any without handrails? No   Home free of loose throw rugs in walkways, pet beds, electrical cords, etc? Yes  Adequate lighting in your home to reduce risk of falls? Yes   ASSISTIVE DEVICES UTILIZED TO PREVENT FALLS:  Life alert? No  Use of a cane, walker or w/c? No  Grab bars in the  bathroom? No  Shower chair or bench in shower? No  Elevated toilet seat or a handicapped toilet? No   TIMED UP AND GO:  Was the test performed? No .    Depression Screen PHQ 2/9 Scores 12/17/2018 11/23/2018 11/17/2017 11/16/2016  PHQ - 2 Score 0 0 0 0  PHQ- 9 Score - 0 - -     Cognitive Function     6CIT Screen 12/17/2018 11/16/2016  What Year? 0 points 0 points  What month? 0 points 0 points  What time? 0 points 0 points  Count back from 20 0 points 0 points  Months in reverse 0 points 0 points  Repeat phrase 0 points 0 points  Total Score 0 0    Immunization History  Administered Date(s) Administered  . Influenza, High Dose Seasonal PF 01/12/2015, 03/29/2016, 01/31/2017, 01/02/2018  . Pneumococcal Conjugate-13 10/07/2013  . Pneumococcal Polysaccharide-23 02/17/2004, 02/09/2011  . Td 09/01/2006  . Tdap 09/29/2016  . Zoster 04/09/2009    Qualifies for Shingles Vaccine? Yes  Zostavax completed 04/09/09. Due for Shingrix. Education has been provided regarding the importance of this vaccine. Pt has been advised to call insurance company to determine out of pocket expense. Advised may also receive vaccine at local pharmacy or Health Dept. Verbalized acceptance and understanding.  Tdap: Up to date  Flu Vaccine: Due for Flu vaccine. Does the patient want to receive this vaccine today?  No .   Pneumococcal Vaccine: Completed series  Screening Tests Health Maintenance  Topic Date Due  . FOOT EXAM  09/29/2017  . OPHTHALMOLOGY EXAM  10/20/2018  . INFLUENZA VACCINE  11/17/2018  . HEMOGLOBIN A1C  05/26/2019  . DEXA SCAN  12/01/2022  . TETANUS/TDAP  09/30/2026  . PNA vac Low Risk Adult  Completed    Cancer Screenings:  Colorectal Screening: No longer required.  Mammogram: No longer required.   Bone Density: Completed 11/30/17. Results reflect OSTEOPENIA. Repeat every 5 years.   Lung Cancer Screening: (Low Dose CT Chest recommended if Age 28-80 years, 30 pack-year currently  smoking OR have quit w/in 15years.) does not qualify.   Additional Screening:  Dental Screening: Recommended annual dental exams for proper oral hygiene   Community Resource Referral:  CRR required this visit?  No       Plan:  I have personally reviewed and addressed the Medicare Annual Wellness questionnaire and have noted the following in the patient's chart:  A. Medical and social history B. Use of alcohol, tobacco or illicit drugs  C. Current medications and supplements D. Functional ability and status E.  Nutritional status F.  Physical activity G. Advance directives H. List of other physicians I.  Hospitalizations, surgeries, and ER visits in previous 12 months J.  [place] such as hearing and vision if needed, cognitive and depression L. Referrals and appointments   In addition, I have reviewed and discussed with patient certain preventive protocols, quality metrics, and best practice recommendations. A written personalized care plan for preventive services as well as general preventive health recommendations were provided to patient.   [place]ora Score, [place]  7/74/1287 Nurse Health Advisor   Nurse Notes: Needs a flu shot and diabetic foot exam at next in office visit. Eye exam scheduled for 12/25/18.

## 2018-12-17 ENCOUNTER — Other Ambulatory Visit: Payer: Self-pay

## 2018-12-17 ENCOUNTER — Ambulatory Visit (INDEPENDENT_AMBULATORY_CARE_PROVIDER_SITE_OTHER): Payer: Medicare Other

## 2018-12-17 DIAGNOSIS — Z0000 Encounter for general adult medical examination without abnormal findings: Secondary | ICD-10-CM | POA: Diagnosis not present

## 2018-12-17 NOTE — Patient Instructions (Signed)
Rachel Lester , Thank you for taking time to come for your Medicare Wellness Visit. I appreciate your ongoing commitment to your health goals. Please review the following plan we discussed and let me know if I can assist you in the future.   Screening recommendations/referrals: Colonoscopy: Up to date, no repeat needed. Mammogram: No longer required.  Bone Density: Up to date, due 11/2022 Recommended yearly ophthalmology/optometry visit for glaucoma screening and checkup Recommended yearly dental visit for hygiene and checkup  Vaccinations: Influenza vaccine: Currently due Pneumococcal vaccine: Completed series Tdap vaccine: Up to date, due 09/2026 Shingles vaccine: Pt declines today.     Advanced directives: Advance directive discussed with you today. Even though you declined this today please call our office should you change your mind and we can give you the proper paperwork for you to fill out.  Conditions/risks identified: Continue to work towards exercising for 3 days a week for at least 30 minutes at a time.   Next appointment: 03/29/19 with Dr Caryn Section.    Preventive Care 52 Years and Older, Female Preventive care refers to lifestyle choices and visits with your health care provider that can promote health and wellness. What does preventive care include?  A yearly physical exam. This is also called an annual well check.  Dental exams once or twice a year.  Routine eye exams. Ask your health care provider how often you should have your eyes checked.  Personal lifestyle choices, including:  Daily care of your teeth and gums.  Regular physical activity.  Eating a healthy diet.  Avoiding tobacco and drug use.  Limiting alcohol use.  Practicing safe sex.  Taking low-dose aspirin every day.  Taking vitamin and mineral supplements as recommended by your health care provider. What happens during an annual well check? The services and screenings done by your health care  provider during your annual well check will depend on your age, overall health, lifestyle risk factors, and family history of disease. Counseling  Your health care provider may ask you questions about your:  Alcohol use.  Tobacco use.  Drug use.  Emotional well-being.  Home and relationship well-being.  Sexual activity.  Eating habits.  History of falls.  Memory and ability to understand (cognition).  Work and work Statistician.  Reproductive health. Screening  You may have the following tests or measurements:  Height, weight, and BMI.  Blood pressure.  Lipid and cholesterol levels. These may be checked every 5 years, or more frequently if you are over 77 years old.  Skin check.  Lung cancer screening. You may have this screening every year starting at age 17 if you have a 30-pack-year history of smoking and currently smoke or have quit within the past 15 years.  Fecal occult blood test (FOBT) of the stool. You may have this test every year starting at age 58.  Flexible sigmoidoscopy or colonoscopy. You may have a sigmoidoscopy every 5 years or a colonoscopy every 10 years starting at age 7.  Hepatitis C blood test.  Hepatitis B blood test.  Sexually transmitted disease (STD) testing.  Diabetes screening. This is done by checking your blood sugar (glucose) after you have not eaten for a while (fasting). You may have this done every 1-3 years.  Bone density scan. This is done to screen for osteoporosis. You may have this done starting at age 12.  Mammogram. This may be done every 1-2 years. Talk to your health care provider about how often you should have regular  mammograms. Talk with your health care provider about your test results, treatment options, and if necessary, the need for more tests. Vaccines  Your health care provider may recommend certain vaccines, such as:  Influenza vaccine. This is recommended every year.  Tetanus, diphtheria, and acellular  pertussis (Tdap, Td) vaccine. You may need a Td booster every 10 years.  Zoster vaccine. You may need this after age 55.  Pneumococcal 13-valent conjugate (PCV13) vaccine. One dose is recommended after age 79.  Pneumococcal polysaccharide (PPSV23) vaccine. One dose is recommended after age 48. Talk to your health care provider about which screenings and vaccines you need and how often you need them. This information is not intended to replace advice given to you by your health care provider. Make sure you discuss any questions you have with your health care provider. Document Released: 05/01/2015 Document Revised: 12/23/2015 Document Reviewed: 02/03/2015 Elsevier Interactive Patient Education  2017 [place] Prevention in the Home Falls can cause injuries. They can happen to people of all ages. There are many things you can do to make your home safe and to help prevent falls. What can I do on the outside of my home?  Regularly fix the edges of walkways and driveways and fix any cracks.  Remove anything that might make you trip as you walk through a door, such as a raised step or threshold.  Trim any bushes or trees on the path to your home.  Use bright outdoor lighting.  Clear any walking paths of anything that might make someone trip, such as rocks or tools.  Regularly check to see if handrails are loose or broken. Make sure that both sides of any steps have handrails.  Any raised decks and porches should have guardrails on the edges.  Have any leaves, snow, or ice cleared regularly.  Use sand or salt on walking paths during winter.  Clean up any spills in your garage right away. This includes oil or grease spills. What can I do in the bathroom?  Use night lights.  Install grab bars by the toilet and in the tub and shower. Do not use towel bars as grab bars.  Use non-skid mats or decals in the tub or shower.  If you need to sit down in the shower, use a plastic,  non-slip stool.  Keep the floor dry. Clean up any water that spills on the floor as soon as it happens.  Remove soap buildup in the tub or shower regularly.  Attach bath mats securely with double-sided non-slip rug tape.  Do not have throw rugs and other things on the floor that can make you trip. What can I do in the bedroom?  Use night lights.  Make sure that you have a light by your bed that is easy to reach.  Do not use any sheets or blankets that are too big for your bed. They should not hang down onto the floor.  Have a firm chair that has side arms. You can use this for support while you get dressed.  Do not have throw rugs and other things on the floor that can make you trip. What can I do in the kitchen?  Clean up any spills right away.  Avoid walking on wet floors.  Keep items that you use a lot in easy-to-reach places.  If you need to reach something above you, use a strong step stool that has a grab bar.  Keep electrical cords out of the way.  Do not use floor polish or wax that makes floors slippery. If you must use wax, use non-skid floor wax.  Do not have throw rugs and other things on the floor that can make you trip. What can I do with my stairs?  Do not leave any items on the stairs.  Make sure that there are handrails on both sides of the stairs and use them. Fix handrails that are broken or loose. Make sure that handrails are as long as the stairways.  Check any carpeting to make sure that it is firmly attached to the stairs. Fix any carpet that is loose or worn.  Avoid having throw rugs at the top or bottom of the stairs. If you do have throw rugs, attach them to the floor with carpet tape.  Make sure that you have a light switch at the top of the stairs and the bottom of the stairs. If you do not have them, ask someone to add them for you. What else can I do to help prevent falls?  Wear shoes that:  Do not have high heels.  Have rubber  bottoms.  Are comfortable and fit you well.  Are closed at the toe. Do not wear sandals.  If you use a stepladder:  Make sure that it is fully opened. Do not climb a closed stepladder.  Make sure that both sides of the stepladder are locked into place.  Ask someone to hold it for you, if possible.  Clearly mark and make sure that you can see:  Any grab bars or handrails.  First and last steps.  Where the edge of each step is.  Use tools that help you move around (mobility aids) if they are needed. These include:  Canes.  Walkers.  Scooters.  Crutches.  Turn on the lights when you go into a dark area. Replace any light bulbs as soon as they burn out.  Set up your furniture so you have a clear path. Avoid moving your furniture around.  If any of your floors are uneven, fix them.  If there are any pets around you, be aware of where they are.  Review your medicines with your doctor. Some medicines can make you feel dizzy. This can increase your chance of falling. Ask your doctor what other things that you can do to help prevent falls. This information is not intended to replace advice given to you by your health care provider. Make sure you discuss any questions you have with your health care provider. Document Released: 01/29/2009 Document Revised: 09/10/2015 Document Reviewed: 05/09/2014 Elsevier Interactive Patient Education  2017 [employer].

## 2018-12-18 DIAGNOSIS — U071 COVID-19: Secondary | ICD-10-CM

## 2018-12-18 HISTORY — DX: COVID-19: U07.1

## 2018-12-25 ENCOUNTER — Other Ambulatory Visit: Payer: Self-pay

## 2018-12-25 ENCOUNTER — Ambulatory Visit: Payer: Medicare Other | Admitting: Surgery

## 2018-12-25 ENCOUNTER — Encounter: Payer: Self-pay | Admitting: Surgery

## 2018-12-25 VITALS — BP 156/69 | HR 101 | Temp 97.7°F | Resp 16 | Ht 65.0 in | Wt 167.8 lb

## 2018-12-25 DIAGNOSIS — D1721 Benign lipomatous neoplasm of skin and subcutaneous tissue of right arm: Secondary | ICD-10-CM

## 2018-12-25 DIAGNOSIS — E113311 Type 2 diabetes mellitus with moderate nonproliferative diabetic retinopathy with macular edema, right eye: Secondary | ICD-10-CM | POA: Diagnosis not present

## 2018-12-25 HISTORY — PX: LIPOMA EXCISION: SHX5283

## 2018-12-25 LAB — HM DIABETES EYE EXAM

## 2018-12-25 MED ORDER — IBUPROFEN 600 MG PO TABS: 600.0000 mg | ORAL_TABLET | Freq: Three times a day (TID) | ORAL | 0 refills | Status: DC | PRN

## 2018-12-25 MED ORDER — OXYCODONE HCL 5 MG PO TABS: 5.0000 mg | ORAL_TABLET | Freq: Four times a day (QID) | ORAL | 0 refills | Status: DC | PRN

## 2018-12-25 NOTE — Patient Instructions (Addendum)
Please pick up your medication at the [facility].     Today we have removed a Lipoma in our office. Please see information below regarding this type of tumor.  You are free to shower in 48 hours. This will be on 12/27/2018. You have glue on your skin and sutures under the skin. The glue will come off on it's own in 10-14 days. You may shower normally until this occurs but do not submerge.  Please use Tylenol or Ibuprofen for pain as needed.  We will see you back in 2 weeks to ensure that this has healed and to review the final pathology. Please see your appointment below. You may continue your regular activities right away but if you are having pain while doing something, stop what you are doing and try this activity once again in 3 days. Please call our office with any questions or concerns prior to your appointment.   Lipoma Removal Lipoma removal is a surgical procedure to remove a noncancerous (benign) tumor that is made up of fat cells (lipoma). Most lipomas are small and painless and do not require treatment. They can form in many areas of the body but are most common under the skin of the back, shoulders, arms, and thighs. You may need lipoma removal if you have a lipoma that is large, growing, or causing discomfort. Lipoma removal may also be done for cosmetic reasons. Tell a health care provider about:  Any allergies you have.  All medicines you are taking, including vitamins, herbs, eye drops, creams, and over-the-counter medicines.  Any problems you or family members have had with anesthetic medicines.  Any blood disorders you have.  Any surgeries you have had.  Any medical conditions you have.  Whether you are pregnant or may be pregnant. What are the risks? Generally, this is a safe procedure. However, problems may occur, including:  Infection.  Bleeding.  Allergic reactions to medicines.  Damage to nerves or blood vessels near the lipoma.  Scarring.   What happens before the procedure? Staying hydrated Follow instructions from your health care provider about hydration, which may include:  Up to 2 hours before the procedure - you may continue to drink clear liquids, such as water, clear fruit juice, black coffee, and plain tea.  Eating and drinking restrictions Follow instructions from your health care provider about eating and drinking, which may include:  8 hours before the procedure - stop eating heavy meals or foods such as meat, fried foods, or fatty foods.  6 hours before the procedure - stop eating light meals or foods, such as toast or cereal.  6 hours before the procedure - stop drinking milk or drinks that contain milk.  2 hours before the procedure - stop drinking clear liquids.  Medicines  Ask your health care provider about: ? Changing or stopping your regular medicines. This is especially important if you are taking diabetes medicines or blood thinners. ? Taking medicines such as aspirin and ibuprofen. These medicines can thin your blood. Do not take these medicines before your procedure if your health care provider instructs you not to.  You may be given antibiotic medicine to help prevent infection. General instructions  Ask your health care provider how your surgical site will be marked or identified.  You will have a physical exam. Your health care provider will check the size of the lipoma and whether it can be moved easily.  You may have imaging tests, such as: ? X-rays. ? CT  scan. ? MRI.  Plan to have someone take you home from the hospital or clinic. What happens during the procedure?  To reduce your risk of infection: ? Your health care team will wash or sanitize their hands. ? Your skin will be washed with soap.  You will be given one or more of the following: ? A medicine to help you relax (sedative). ? A medicine to numb the area (local anesthetic). ? A medicine to make you fall asleep  (general anesthetic). ? A medicine that is injected into an area of your body to numb everything below the injection site (regional anesthetic).  An incision will be made over the lipoma or very near the lipoma. The incision may be made in a natural skin line or crease.  Tissues, nerves, and blood vessels near the lipoma will be moved out of the way.  The lipoma and the capsule that surrounds it will be separated from the surrounding tissues.  The lipoma will be removed.  The incision may be closed with stitches (sutures).  A bandage (dressing) will be placed over the incision. What happens after the procedure?  Do not drive for 24 hours if you received a sedative.  Your blood pressure, heart rate, breathing rate, and blood oxygen level will be monitored until the medicines you were given have worn off. This information is not intended to replace advice given to you by your health care provider. Make sure you discuss any questions you have with your health care provider. Document Released: 06/18/2015 Document Revised: 09/10/2015 Document Reviewed: 06/18/2015 Elsevier Interactive Patient Education  [employer].

## 2018-12-26 NOTE — Progress Notes (Signed)
  Procedure Date:  12/26/2018  Pre-operative Diagnosis:  Lipomas of right upper extremity  Post-operative Diagnosis:  Lipomas of right upper extremity  Procedure:  Excision of two lipomas of right upper extremity.  Surgeon:  Melvyn Neth, MD  Anesthesia:  4 ml 1% lidocaine with epi  Estimated Blood Loss:  5 ml  Specimens:  Right distal forearm mass, right proximal forearm mass  Complications:  None  Indications for Procedure:  This is a 78 y.o. female with diagnosis of a symptomatic right upper extremity lipoma x 2.  The patient wishes to have this excised. The risks of bleeding, abscess or infection, injury to surrounding structures, and need for further procedures were all discussed with the patient and she was willing to proceed.  Description of Procedure: The patient was correctly identified at bedside.  Appropriate time-outs were performed.  The patient's right forearm was prepped and draped in usual sterile fashion.  The patient had two masses, one proximal and one distal at dorsal aspect of forearm.  The distal mass was excised first.  Local anesthetic was infused intradermally.  A 3 cm incision was made over the lipoma, going sharply with scalpel through the subcutaneous tissue to the lipoma itself.  Skin flaps were created, and then the lipoma was excised intact.  It was sent off to pathology.  The cavity was then irrigated and hemostasis was assured.  I then proceeded to the proximal mass.  A 3 cm incision was made over the lipoma, going sharply with scalpel through the subcutaneous tissue to the lipoma itself.  Skin flaps were created, and then the lipoma was excised intact.  It was sent off to pathology.  The cavity was then irrigated and hemostasis was assured.  Both wounds were then closed in two layers using 3-0 Vicryl and 4-0 Monocryl.  The incisions were cleaned and sealed with DermaBond.   The patient tolerated the procedure well and all sharps were appropriately  disposed of at the end of the case.   Melvyn Neth, MD

## 2018-12-28 ENCOUNTER — Emergency Department: Payer: Medicare Other

## 2018-12-28 ENCOUNTER — Other Ambulatory Visit: Payer: Self-pay

## 2018-12-28 ENCOUNTER — Emergency Department
Admission: EM | Admit: 2018-12-28 | Discharge: 2018-12-28 | Disposition: A | Discharge status: Home / Self Care | Payer: Medicare Other | Attending: Emergency Medicine | Admitting: Emergency Medicine

## 2018-12-28 DIAGNOSIS — E1122 Type 2 diabetes mellitus with diabetic chronic kidney disease: Secondary | ICD-10-CM | POA: Insufficient documentation

## 2018-12-28 DIAGNOSIS — N183 Chronic kidney disease, stage 3 (moderate): Secondary | ICD-10-CM | POA: Diagnosis not present

## 2018-12-28 DIAGNOSIS — Z79899 Other long term (current) drug therapy: Secondary | ICD-10-CM | POA: Insufficient documentation

## 2018-12-28 DIAGNOSIS — Z87891 Personal history of nicotine dependence: Secondary | ICD-10-CM | POA: Diagnosis not present

## 2018-12-28 DIAGNOSIS — R42 Dizziness and giddiness: Secondary | ICD-10-CM | POA: Diagnosis not present

## 2018-12-28 DIAGNOSIS — N390 Urinary tract infection, site not specified: Secondary | ICD-10-CM | POA: Diagnosis not present

## 2018-12-28 DIAGNOSIS — I129 Hypertensive chronic kidney disease with stage 1 through stage 4 chronic kidney disease, or unspecified chronic kidney disease: Secondary | ICD-10-CM | POA: Diagnosis not present

## 2018-12-28 DIAGNOSIS — R110 Nausea: Secondary | ICD-10-CM | POA: Diagnosis not present

## 2018-12-28 LAB — COMPREHENSIVE METABOLIC PANEL
ALT: 17 U/L (ref 0–44)
AST: 19 U/L (ref 15–41)
Albumin: 4.2 g/dL (ref 3.5–5.0)
Alkaline Phosphatase: 63 U/L (ref 38–126)
Anion gap: 7 (ref 5–15)
BUN: 48 mg/dL — ABNORMAL HIGH (ref 8–23)
CO2: 23 mmol/L (ref 22–32)
Calcium: 9.2 mg/dL (ref 8.9–10.3)
Chloride: 108 mmol/L (ref 98–111)
Creatinine, Ser: 1.55 mg/dL — ABNORMAL HIGH (ref 0.44–1.00)
GFR calc Af Amer: 37 mL/min — ABNORMAL LOW (ref >60)
GFR calc non Af Amer: 32 mL/min — ABNORMAL LOW (ref >60)
Glucose, Bld: 201 mg/dL — ABNORMAL HIGH (ref 70–99)
Potassium: 4.9 mmol/L (ref 3.5–5.1)
Sodium: 138 mmol/L (ref 135–145)
Total Bilirubin: 0.4 mg/dL (ref 0.3–1.2)
Total Protein: 7.5 g/dL (ref 6.5–8.1)

## 2018-12-28 LAB — CBC
HCT: 33.5 % — ABNORMAL LOW (ref 36.0–46.0)
Hemoglobin: 10.7 g/dL — ABNORMAL LOW (ref 12.0–15.0)
MCH: 30 pg (ref 26.0–34.0)
MCHC: 31.9 g/dL (ref 30.0–36.0)
MCV: 93.8 fL (ref 80.0–100.0)
Platelets: 246 10*3/uL (ref 150–400)
RBC: 3.57 MIL/uL — ABNORMAL LOW (ref 3.87–5.11)
RDW: 13.2 % (ref 11.5–15.5)
WBC: 5.2 10*3/uL (ref 4.0–10.5)
nRBC: 0 % (ref 0.0–0.2)

## 2018-12-28 LAB — URINALYSIS, COMPLETE (UACMP) WITH MICROSCOPIC
Bilirubin Urine: NEGATIVE
Glucose, UA: 50 mg/dL — AB
Hgb urine dipstick: NEGATIVE
Ketones, ur: NEGATIVE mg/dL
Nitrite: NEGATIVE
Protein, ur: NEGATIVE mg/dL
Specific Gravity, Urine: 1.011 (ref 1.005–1.030)
pH: 5 (ref 5.0–8.0)

## 2018-12-28 MED ORDER — MECLIZINE HCL 25 MG PO TABS
25.0000 mg | ORAL_TABLET | Freq: Once | ORAL | Status: AC
  Administered 2018-12-28: 25 mg via ORAL
  Filled 2018-12-28: qty 1

## 2018-12-28 MED ORDER — FOSFOMYCIN TROMETHAMINE 3 G PO PACK
3.0000 g | PACK | Freq: Once | ORAL | Status: AC
  Administered 2018-12-28: 13:00:00 3 g via ORAL
  Filled 2018-12-28: qty 3

## 2018-12-28 MED ORDER — MECLIZINE HCL 25 MG PO TABS: 25.0000 mg | ORAL_TABLET | Freq: Three times a day (TID) | ORAL | 0 refills | Status: DC | PRN

## 2018-12-28 MED ORDER — SODIUM CHLORIDE 0.9 % IV BOLUS
1000.0000 mL | Freq: Once | INTRAVENOUS | Status: AC
  Administered 2018-12-28: 11:00:00 1000 mL via INTRAVENOUS

## 2018-12-28 NOTE — ED Triage Notes (Signed)
Dizziness and nausea X 1 week. Worse with ambulation and movement. Pt alert and oriented X4, cooperative, RR even and unlabored, color WNL. Pt in NAD.

## 2018-12-28 NOTE — ED Notes (Signed)
Patient transported to CT 

## 2018-12-28 NOTE — ED Provider Notes (Signed)
[facility] Emergency Department Provider Note  Time seen: 10:46 AM  I have reviewed the triage vital signs and the nursing notes.   HISTORY  Chief Complaint Dizziness and Nausea   HPI Rachel Lester is a 78 y.o. female with a past medical history of diabetes, hypertension, hyperlipidemia presents to the emergency department for dizziness and nausea.  According to the patient for the past 1 week she has had a sensation of being off balance and nauseated.   Is any focal weakness or numbness confusion or slurred speech.  Denies a spinning sensation, states she just feels somewhat off balance but only when she is up and moving.  Denies any dizziness while lying in bed.  Denies any recent vomiting diarrhea, dysuria, fever cough congestion or shortness of breath.  Past Medical History:  Diagnosis Date  . Diabetes mellitus without complication ([place])    type 2  . Hyperlipidemia   . Hypertension     Patient Active Problem List   Diagnosis Date Noted  . Abnormal ankle brachial index (ABI) 09/29/2016  . Diverticulosis 12/30/2014  . 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 Hypercholesterolemia 12/30/2014  . Hyperkalemia 12/30/2014  . Hypertension 12/30/2014  . Chronic kidney disease, stage III (moderate) ([place]) 12/30/2014  . Hematuria 12/30/2014  . Microalbuminuria 12/30/2014    History reviewed. No pertinent surgical history.  Prior to Admission medications   Medication Sig Start Date End Date Taking? Authorizing Provider  ACCU-CHEK AVIVA PLUS test strip CHECK BLOOD SUGAR THREE  TIMES DAILY 11/06/18   Birdie Sons, MD  amLODipine (NORVASC) 2.5 MG tablet TAKE 1 TABLET BY MOUTH DAILY 05/29/18   Birdie Sons, MD  aspirin (ASPIRIN ADULT LOW STRENGTH) 81 MG EC tablet Take 81 mg by mouth daily. Swallow whole.    [provider]  b complex vitamins tablet Take 1 tablet by mouth daily.    [provider]  Biotin 7500  MCG TABS Take by mouth daily.     [provider]  Cholecalciferol (VITAMIN D3) 2000 UNITS capsule Take 2,000 Units by mouth daily.    [provider]  Cyanocobalamin (VITAMIN B 12 PO) Take 600 mg by mouth daily.    [provider]  glimepiride (AMARYL) 4 MG tablet TAKE 1 TABLET BY MOUTH EVERY MORNING 06/25/18   Birdie Sons, MD  hydrochlorothiazide (HYDRODIURIL) 25 MG tablet TAKE 1 TABLET(25 MG) BY MOUTH DAILY 06/25/18   Birdie Sons, MD  ibuprofen (ADVIL) 600 MG tablet Take 1 tablet (600 mg total) by mouth every 8 (eight) hours as needed. 12/25/18   Olean Ree, MD  lovastatin (MEVACOR) 40 MG tablet TAKE ONE (1) TABLET AT BEDTIME 04/19/18   Birdie Sons, MD  Omega-3 Fatty Acids (FISH OIL BURP-LESS) 1200 MG CAPS Take 1 capsule by mouth daily.    [provider]  oxyCODONE (ROXICODONE) 5 MG immediate release tablet Take 1 tablet (5 mg total) by mouth every 6 (six) hours as needed. 12/25/18 12/25/19  Olean Ree, MD  pioglitazone (ACTOS) 30 MG tablet TAKE 1 TABLET BY MOUTH EVERY DAY 08/24/18   Birdie Sons, MD  vitamin C (ASCORBIC ACID) 500 MG tablet Take 500 mg by mouth daily.    [provider]    Allergies  Allergen Reactions  . Metformin Hcl     Weight Loss-anorexia     Family History  Problem Relation Age of Onset  . Alzheimer's disease Mother   .  Coronary artery disease Father   . Arthritis Sister        Lupus  . Diabetes Brother        Type 2  . Diabetes Sister        Type 2  . Cancer Sister     Social History Social History   Tobacco Use  . Smoking status: Former Smoker    Packs/day: 2.00    Years: 20.00    Pack years: 40.00    Types: Cigarettes    Quit date: 1980    Years since quitting: 40.7  . Smokeless tobacco: Never Used  . Tobacco comment: smoked about 2 packs per day, smoked for about 20 years; Quit in 1980  Substance Use Topics  . Alcohol use: Yes    Alcohol/week: 0.0 standard drinks    Comment:  occasional- 1/month (wine)  . Drug use: No    Review of Systems Constitutional: Negative for fever.  Positive for feeling off balance at times. Cardiovascular: Negative for chest pain. Respiratory: Negative for shortness of breath. Gastrointestinal: Negative for abdominal pain, vomiting Genitourinary: Negative for urinary compaints Musculoskeletal: Negative for musculoskeletal complaints Skin: Negative for skin complaints  Neurological: Negative for headache All other ROS negative  ____________________________________________   PHYSICAL EXAM:  VITAL SIGNS: ED Triage Vitals [12/28/18 1033]  Enc Vitals Group     BP (!) 161/59     Pulse Rate 81     Resp 18     Temp 98.1 F (36.7 C)     Temp Source Oral     SpO2 99 %     Weight 167 lb 8.8 oz (76 kg)     Height 5\' 5"  (1.651 m)     Head Circumference      Peak Flow      Pain Score 0     Pain Loc      Pain Edu?      Excl. in [place]?     Constitutional: Alert and oriented. Well appearing and in no distress. Eyes: Normal exam ENT      Head: Normocephalic and atraumatic.      Mouth/Throat: Mucous membranes are moist. Cardiovascular: Normal rate, regular rhythm.  Respiratory: Normal respiratory effort without tachypnea nor retractions. Breath sounds are clear  Gastrointestinal: Soft and nontender. No distention.   Musculoskeletal: Nontender with normal range of motion in all extremities.  Neurologic:  Normal speech and language. No gross focal neurologic deficits.  Equal grip strength bilaterally.  No pronator drift.  No lower extremity drift.  Finger-to-nose testing intact.  Cranial nerves intact. Skin:  Skin is warm, dry and intact.  Psychiatric: Mood and affect are normal.   ____________________________________________    EKG  EKG viewed and interpreted by myself shows a normal sinus rhythm 82 bpm with a narrow QRS, normal axis, normal intervals, no concerning ST changes.  ____________________________________________     RADIOLOGY  CT scan of the head is negative for acute abnormality  ____________________________________________   INITIAL IMPRESSION / ASSESSMENT AND PLAN / ED COURSE  Pertinent labs & imaging results that were available during my care of the patient were reviewed by me and considered in my medical decision making (see chart for details).   Patient presents to the emergency department for intermittent nausea and dizziness over the past 1 week.  Differential would include metabolic or electrolyte abnormality, infectious etiology such as urinary tract infection, CVA, dehydration.  Reassuringly patient has a normal physical exam including a normal neurological exam.  We will check labs, urinalysis, IV hydrate and treat with meclizine.  We will obtain CT imaging of the head as a precaution and continue to closely monitor.  CT scan negative for acute abnormality.  Patient's work-up has resulted showing a possible mild urinary tract infection we will dose of fosfomycin and send a urine culture.  Overall the patient appears well.  Rachel Lester was evaluated in Emergency Department on 12/28/2018 for the symptoms described in the history of present illness. She was evaluated in the context of the global COVID-19 pandemic, which necessitated consideration that the patient might be at risk for infection with the SARS-CoV-2 virus that causes COVID-19. Institutional protocols and algorithms that pertain to the evaluation of patients at risk for COVID-19 are in a state of rapid change based on information released by regulatory bodies including the CDC and federal and state organizations. These policies and algorithms were followed during the patient's care in the ED.  ____________________________________________   FINAL CLINICAL IMPRESSION(S) / ED DIAGNOSES  Dizziness Nausea   Harvest Dark, MD 12/28/18 1227

## 2018-12-30 LAB — URINE CULTURE: Culture: <10000 — AB

## 2018-12-31 ENCOUNTER — Telehealth: Payer: Self-pay | Admitting: Family Medicine

## 2018-12-31 NOTE — Telephone Encounter (Signed)
Please advise. Should I schedule ER f/u appt?

## 2018-12-31 NOTE — Telephone Encounter (Signed)
Should probably schedule o.v. for follow up if not getting better

## 2018-12-31 NOTE — Telephone Encounter (Signed)
Pt went to the ER Friday for lightheadedness   They gave her meclizine 25 mg and she has been taking it and is still having vertigo.  CB#  [phone]  [place]

## 2019-01-01 NOTE — Telephone Encounter (Signed)
Arranged appt for tomorrow at 10:40AM

## 2019-01-02 ENCOUNTER — Other Ambulatory Visit: Payer: Self-pay

## 2019-01-02 ENCOUNTER — Ambulatory Visit (INDEPENDENT_AMBULATORY_CARE_PROVIDER_SITE_OTHER): Payer: Medicare Other | Admitting: Family Medicine

## 2019-01-02 ENCOUNTER — Encounter: Payer: Self-pay | Admitting: Family Medicine

## 2019-01-02 VITALS — BP 132/62 | HR 79 | Temp 97.1°F | Resp 16 | Wt 168.0 lb

## 2019-01-02 DIAGNOSIS — G3281 Cerebellar ataxia in diseases classified elsewhere: Secondary | ICD-10-CM

## 2019-01-02 DIAGNOSIS — Z23 Encounter for immunization: Secondary | ICD-10-CM | POA: Diagnosis not present

## 2019-01-02 NOTE — Progress Notes (Signed)
Patient: Rachel Lester Female    DOB: 02-13-1941   78 y.o.   MRN: [id number] Visit Date: 01/02/2019  Today's Provider: Lelon Huh, MD   Chief Complaint  Patient presents with  . Follow-up   Subjective:     HPI   Follow Up ER Visit  Patient is here for ER follow up.  She was recently seen at [facility] for Dizziness and Nausea on 12/28/2018 which started earlier that day.  urinalysis remarkable for large leukocytes and given meclizine, a single dose of fosfomycin and IV fluids, but there was no significant bacterial grown on u/a.  She reports good compliance with treatment. She reports this condition is Unchanged. Still feels nauseated and dizzy.   Unremarkable met C and urine culture, mild anemia with hgb=10.7 from ER.  Was noted to have mucosal thickening in several ethmoid air cells, otherwise normal head CT.   Describes it as feeling off balance when she stands and walks. No symptoms when sitting or lying. No falls.  ------------------------------------------------------------------------------------   Allergies  Allergen Reactions  . Metformin Hcl     Weight Loss-anorexia      Current Outpatient Medications:  .  ACCU-CHEK AVIVA PLUS test strip, CHECK BLOOD SUGAR THREE  TIMES DAILY, Disp: 300 strip, Rfl: 4 .  amLODipine (NORVASC) 2.5 MG tablet, TAKE 1 TABLET BY MOUTH DAILY, Disp: 30 tablet, Rfl: 11 .  aspirin (ASPIRIN ADULT LOW STRENGTH) 81 MG EC tablet, Take 81 mg by mouth daily. Swallow whole., Disp: , Rfl:  .  b complex vitamins tablet, Take 1 tablet by mouth daily., Disp: , Rfl:  .  Biotin 7500 MCG TABS, Take by mouth daily. , Disp: , Rfl:  .  Cholecalciferol (VITAMIN D3) 2000 UNITS capsule, Take 2,000 Units by mouth daily., Disp: , Rfl:  .  Coenzyme Q10 (COQ10 PO), Take 1 capsule by mouth daily., Disp: , Rfl:  .  Cyanocobalamin (VITAMIN B 12 PO), Take 600 mg by mouth daily., Disp: , Rfl:  .  glimepiride (AMARYL) 4 MG tablet, TAKE 1 TABLET BY MOUTH EVERY  MORNING, Disp: 30 tablet, Rfl: 12 .  hydrochlorothiazide (HYDRODIURIL) 25 MG tablet, TAKE 1 TABLET(25 MG) BY MOUTH DAILY, Disp: 30 tablet, Rfl: 12 .  ibuprofen (ADVIL) 600 MG tablet, Take 1 tablet (600 mg total) by mouth every 8 (eight) hours as needed., Disp: 30 tablet, Rfl: 0 .  lovastatin (MEVACOR) 40 MG tablet, TAKE ONE (1) TABLET AT BEDTIME, Disp: 90 tablet, Rfl: 3 .  meclizine (ANTIVERT) 25 MG tablet, Take 1 tablet (25 mg total) by mouth 3 (three) times daily as needed for dizziness., Disp: 30 tablet, Rfl: 0 .  oxyCODONE (ROXICODONE) 5 MG immediate release tablet, Take 1 tablet (5 mg total) by mouth every 6 (six) hours as needed., Disp: 15 tablet, Rfl: 0 .  pioglitazone (ACTOS) 30 MG tablet, TAKE 1 TABLET BY MOUTH EVERY DAY, Disp: 30 tablet, Rfl: 12 .  vitamin C (ASCORBIC ACID) 500 MG tablet, Take 500 mg by mouth daily., Disp: , Rfl:   Review of Systems  Constitutional: Negative for appetite change, chills, fatigue and fever.  Respiratory: Negative for chest tightness and shortness of breath.   Cardiovascular: Negative for chest pain and palpitations.  Gastrointestinal: Positive for nausea. Negative for abdominal pain and vomiting.  Neurological: Positive for dizziness. Negative for weakness.    Social History   Tobacco Use  . Smoking status: Former Smoker    Packs/day: 2.00    Years:  20.00    Pack years: 40.00    Types: Cigarettes    Quit date: 1980    Years since quitting: 40.7  . Smokeless tobacco: Never Used  . Tobacco comment: smoked about 2 packs per day, smoked for about 20 years; Quit in 1980  Substance Use Topics  . Alcohol use: Yes    Alcohol/week: 0.0 standard drinks    Comment: occasional- 1/month (wine)      Objective:   BP 132/62 (BP Location: Right Arm, Cuff Size: Normal)   Pulse 79   Temp (!) 97.1 F (36.2 C) (Temporal)   Resp 16   Wt 168 lb (76.2 kg)   SpO2 95% Comment: room air  BMI 27.96 kg/m  Vitals:   01/02/19 1044 01/02/19 1050  BP: (!)  142/64 132/62  Pulse: 79   Resp: 16   Temp: (!) 97.1 F (36.2 C)   TempSrc: Temporal   SpO2: 95%   Weight: 168 lb (76.2 kg)   Body mass index is 27.96 kg/m.   Physical Exam   General Appearance:    Alert, cooperative, no distress  Eyes:    PERRL, conjunctiva/corneas clear, EOM's intact       Lungs:     Clear to auscultation bilaterally, respirations unlabored  Heart:    Normal heart rate. Normal rhythm. No murmurs, rubs, or gallops.   MS:   All extremities are intact.   Neurologic:   Awake, alert, oriented x 3. Unable to perform finger to nose. Falls to left when performing Tandem Gait. Romberg negative.         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 Need for influenza vaccination  - Flu Vaccine QUAD High Dose(Fluad)  The entirety of the information documented in the History of Present Illness, Review of Systems and Physical Exam were personally obtained by me. Portions of this information were initially documented by Lyndel Pleasure, CMA and reviewed by me for thoroughness and accuracy.      Lelon Huh, MD  [facility]

## 2019-01-04 ENCOUNTER — Encounter: Payer: Self-pay | Admitting: Surgery

## 2019-01-04 ENCOUNTER — Other Ambulatory Visit: Payer: Self-pay

## 2019-01-04 ENCOUNTER — Ambulatory Visit (INDEPENDENT_AMBULATORY_CARE_PROVIDER_SITE_OTHER): Payer: Medicare Other | Admitting: Surgery

## 2019-01-04 VITALS — BP 130/70 | HR 74 | Temp 97.8°F | Ht 67.0 in | Wt 169.0 lb

## 2019-01-04 DIAGNOSIS — D1721 Benign lipomatous neoplasm of skin and subcutaneous tissue of right arm: Secondary | ICD-10-CM

## 2019-01-04 DIAGNOSIS — Z09 Encounter for follow-up examination after completed treatment for conditions other than malignant neoplasm: Secondary | ICD-10-CM

## 2019-01-04 NOTE — Patient Instructions (Signed)
Return as needed.The patient is aware to call back for any questions or concerns.  

## 2019-01-04 NOTE — Progress Notes (Signed)
01/04/2019  HPI: Rachel Lester is a 78 y.o. female s/p excision of right upper extremity masses on 12/25/18.  Presents today for follow up.  Denies any pain, issues with the incisions, redness or drainage.  Vital signs: BP 130/70   Pulse 74   Temp 97.8 F (36.6 C) (Skin)   Ht 5\' 7"  (1.702 m)   Wt 169 lb (76.7 kg)   SpO2 99%   BMI 26.47 kg/m    Physical Exam: Constitutional: No acute distress Skin:  Right forearm incisions healing well, without any induration, erythema, or drainage.  Dermabond is starting to peel off.  Assessment/Plan: This is a 78 y.o. female s/p excision of two lipomas of right upper extremity.  --Discussed pathology results with the patient -- lipomas. --Patient has other lipomas, in her left upper extremity that she would like to take care of in the future, but she wants to wait until next year.  She will call us to schedule. --Follow up prn.   Melvyn Neth, [facility]

## 2019-01-08 ENCOUNTER — Encounter: Payer: Self-pay | Admitting: Family Medicine

## 2019-01-08 DIAGNOSIS — E11319 Type 2 diabetes mellitus with unspecified diabetic retinopathy without macular edema: Secondary | ICD-10-CM | POA: Insufficient documentation

## 2019-01-08 DIAGNOSIS — E113311 Type 2 diabetes mellitus with moderate nonproliferative diabetic retinopathy with macular edema, right eye: Secondary | ICD-10-CM

## 2019-01-13 NOTE — Patient Instructions (Signed)
.   Please review the attached list of medications and notify my office if there are any errors.   . Please bring all of your medications to every appointment so we can make sure that our medication list is the same as yours.   . It is especially important to get the annual flu vaccine this year. If you haven't had it already, please go to your pharmacy or call the office as soon as possible to schedule you flu shot.  

## 2019-01-15 ENCOUNTER — Emergency Department: Payer: Medicare Other

## 2019-01-15 ENCOUNTER — Emergency Department
Admission: EM | Admit: 2019-01-15 | Discharge: 2019-01-15 | Disposition: A | Discharge status: Home / Self Care | Payer: Medicare Other | Attending: Student | Admitting: Student

## 2019-01-15 ENCOUNTER — Other Ambulatory Visit: Payer: Self-pay

## 2019-01-15 ENCOUNTER — Encounter: Payer: Self-pay | Admitting: Emergency Medicine

## 2019-01-15 ENCOUNTER — Other Ambulatory Visit
Admission: RE | Admit: 2019-01-15 | Discharge: 2019-01-15 | Disposition: A | Discharge status: Home / Self Care | Payer: Medicare Other | Source: Ambulatory Visit | Attending: Pediatrics | Admitting: Pediatrics

## 2019-01-15 DIAGNOSIS — R1013 Epigastric pain: Secondary | ICD-10-CM | POA: Diagnosis not present

## 2019-01-15 DIAGNOSIS — N183 Chronic kidney disease, stage 3 (moderate): Secondary | ICD-10-CM | POA: Diagnosis not present

## 2019-01-15 DIAGNOSIS — Z79899 Other long term (current) drug therapy: Secondary | ICD-10-CM | POA: Diagnosis not present

## 2019-01-15 DIAGNOSIS — Z7982 Long term (current) use of aspirin: Secondary | ICD-10-CM | POA: Insufficient documentation

## 2019-01-15 DIAGNOSIS — I129 Hypertensive chronic kidney disease with stage 1 through stage 4 chronic kidney disease, or unspecified chronic kidney disease: Secondary | ICD-10-CM | POA: Diagnosis not present

## 2019-01-15 DIAGNOSIS — U071 COVID-19: Secondary | ICD-10-CM | POA: Insufficient documentation

## 2019-01-15 DIAGNOSIS — E1122 Type 2 diabetes mellitus with diabetic chronic kidney disease: Secondary | ICD-10-CM | POA: Diagnosis not present

## 2019-01-15 DIAGNOSIS — N390 Urinary tract infection, site not specified: Secondary | ICD-10-CM | POA: Diagnosis not present

## 2019-01-15 DIAGNOSIS — Z87891 Personal history of nicotine dependence: Secondary | ICD-10-CM | POA: Diagnosis not present

## 2019-01-15 DIAGNOSIS — R0602 Shortness of breath: Secondary | ICD-10-CM | POA: Insufficient documentation

## 2019-01-15 DIAGNOSIS — J40 Bronchitis, not specified as acute or chronic: Secondary | ICD-10-CM | POA: Diagnosis not present

## 2019-01-15 LAB — CBC WITH DIFFERENTIAL/PLATELET
Abs Immature Granulocytes: 0.02 10*3/uL (ref 0.00–0.07)
Basophils Absolute: 0 10*3/uL (ref 0.0–0.1)
Basophils Relative: 0 %
Eosinophils Absolute: 0.1 10*3/uL (ref 0.0–0.5)
Eosinophils Relative: 1 %
HCT: 32.5 % — ABNORMAL LOW (ref 36.0–46.0)
Hemoglobin: 10.5 g/dL — ABNORMAL LOW (ref 12.0–15.0)
Immature Granulocytes: 0 %
Lymphocytes Relative: 20 %
Lymphs Abs: 1.1 10*3/uL (ref 0.7–4.0)
MCH: 29.5 pg (ref 26.0–34.0)
MCHC: 32.3 g/dL (ref 30.0–36.0)
MCV: 91.3 fL (ref 80.0–100.0)
Monocytes Absolute: 0.5 10*3/uL (ref 0.1–1.0)
Monocytes Relative: 9 %
Neutro Abs: 3.7 10*3/uL (ref 1.7–7.7)
Neutrophils Relative %: 70 %
Platelets: 220 10*3/uL (ref 150–400)
RBC: 3.56 MIL/uL — ABNORMAL LOW (ref 3.87–5.11)
RDW: 12.4 % (ref 11.5–15.5)
WBC: 5.4 10*3/uL (ref 4.0–10.5)
nRBC: 0 % (ref 0.0–0.2)

## 2019-01-15 LAB — COMPREHENSIVE METABOLIC PANEL
ALT: 56 U/L — ABNORMAL HIGH (ref 0–44)
AST: 38 U/L (ref 15–41)
Albumin: 4.1 g/dL (ref 3.5–5.0)
Alkaline Phosphatase: 79 U/L (ref 38–126)
Anion gap: 9 (ref 5–15)
BUN: 58 mg/dL — ABNORMAL HIGH (ref 8–23)
CO2: 23 mmol/L (ref 22–32)
Calcium: 9.3 mg/dL (ref 8.9–10.3)
Chloride: 105 mmol/L (ref 98–111)
Creatinine, Ser: 2.04 mg/dL — ABNORMAL HIGH (ref 0.44–1.00)
GFR calc Af Amer: 27 mL/min — ABNORMAL LOW (ref >60)
GFR calc non Af Amer: 23 mL/min — ABNORMAL LOW (ref >60)
Glucose, Bld: 157 mg/dL — ABNORMAL HIGH (ref 70–99)
Potassium: 4.7 mmol/L (ref 3.5–5.1)
Sodium: 137 mmol/L (ref 135–145)
Total Bilirubin: 0.5 mg/dL (ref 0.3–1.2)
Total Protein: 8.2 g/dL — ABNORMAL HIGH (ref 6.5–8.1)

## 2019-01-15 LAB — FIBRIN DERIVATIVES D-DIMER (ARMC ONLY): Fibrin derivatives D-dimer (ARMC): 2846 ng/mL (FEU) — ABNORMAL HIGH (ref 0.00–499.00)

## 2019-01-15 LAB — BASIC METABOLIC PANEL
Anion gap: 9 (ref 5–15)
BUN: 59 mg/dL — ABNORMAL HIGH (ref 8–23)
CO2: 23 mmol/L (ref 22–32)
Calcium: 9 mg/dL (ref 8.9–10.3)
Chloride: 104 mmol/L (ref 98–111)
Creatinine, Ser: 2.14 mg/dL — ABNORMAL HIGH (ref 0.44–1.00)
GFR calc Af Amer: 25 mL/min — ABNORMAL LOW (ref >60)
GFR calc non Af Amer: 22 mL/min — ABNORMAL LOW (ref >60)
Glucose, Bld: 212 mg/dL — ABNORMAL HIGH (ref 70–99)
Potassium: 4.4 mmol/L (ref 3.5–5.1)
Sodium: 136 mmol/L (ref 135–145)

## 2019-01-15 LAB — SARS CORONAVIRUS 2 BY RT PCR (HOSPITAL ORDER, PERFORMED IN ~~LOC~~ HOSPITAL LAB): SARS Coronavirus 2: POSITIVE — AB

## 2019-01-15 LAB — LACTATE DEHYDROGENASE: LDH: 184 U/L (ref 98–192)

## 2019-01-15 LAB — C-REACTIVE PROTEIN: CRP: 3.9 mg/dL — ABNORMAL HIGH (ref <1.0)

## 2019-01-15 MED ORDER — ONDANSETRON HCL 4 MG PO TABS: 4.0000 mg | ORAL_TABLET | Freq: Three times a day (TID) | ORAL | 0 refills | Status: AC | PRN

## 2019-01-15 MED ORDER — SODIUM CHLORIDE 0.9 % IV BOLUS
500.0000 mL | Freq: Once | INTRAVENOUS | Status: AC
  Administered 2019-01-15: 500 mL via INTRAVENOUS

## 2019-01-15 MED ORDER — ACETAMINOPHEN 500 MG PO TABS
1000.0000 mg | ORAL_TABLET | Freq: Once | ORAL | Status: AC
  Administered 2019-01-15: 20:00:00 1000 mg via ORAL
  Filled 2019-01-15: qty 2

## 2019-01-15 NOTE — ED Triage Notes (Signed)
Pt to ER states she was seen at [facility] this AM and was called back and told her D-dimer was 2000 and she needed to be evaluated at the ER.  Pt denies current SHOB.  Pt states a mild cough and some abdominal pain with cough.

## 2019-01-15 NOTE — ED Notes (Signed)
Pt sitting on the side of the bed speaking with this RN in NAD, pt reports D-dimer elevated at doctors office, reports she was sob earlier but that has improved. No chest pain. A&Ox4

## 2019-01-15 NOTE — Discharge Instructions (Signed)
Thank you for letting us take care of you in the emergency department today. Your coronavirus swab was positive.   Please follow the hygiene and quarantining procedures we discussed, and according to the attached documents.   Please continue to take any regular, prescribed medications. Please stay well hydrated and continue to eat a healthy diet.  New medications we have prescribed:  - Zofran - as needed for nausea or upset stomach  Please return to the ER for any new or worsening symptoms, including difficulty breathing, vomiting, diarrhea, dehydration, or any other signs/symptoms that are concerning to you.

## 2019-01-16 NOTE — ED Provider Notes (Signed)
[facility] Emergency Department Provider Note  ____________________________________________   First MD Initiated Contact with Patient 01/15/19 1851     (approximate)  I have reviewed the triage vital signs and the nursing notes.  History  Chief Complaint Shortness of Breath    HPI Rachel Lester is a 78 y.o. female with history of DM, CKD who presents for several days of sore throat, abdominal discomfort, shortness of breath, dry cough. Known COVID exposure - lives with her daughter and son in law who both tested positive. She has had decreased appetite but not vomiting or diarrhea. She was seen in the clinic earlier today and had labs drawn, noted to have an elevated d-dimer and was recommended evaluation in the ED. She denies any hemoptysis, leg swelling, hormone medications, recent travel or history of VTE.   Past Medical Hx Past Medical History:  Diagnosis Date  . Diabetes mellitus without complication ([place])    type 2  . Hyperlipidemia   . Hypertension     Problem List Patient Active Problem List   Diagnosis Date Noted  . Diabetic retinopathy ([place]) 01/08/2019  . Abnormal ankle brachial index (ABI) 09/29/2016  . Diverticulosis 12/30/2014  . Diabetes mellitus with chronic kidney disease ([place]) 12/30/2014  . Hypercholesterolemia 12/30/2014  . Hyperkalemia 12/30/2014  . Hypertension 12/30/2014  . Chronic kidney disease, stage III (moderate) ([place]) 12/30/2014  . Hematuria 12/30/2014  . Microalbuminuria 12/30/2014    Past Surgical Hx Past Surgical History:  Procedure Laterality Date  . LIPOMA EXCISION Right 12/25/2018   In-office, right forearm lipomas x 2    Medications Prior to Admission medications   Medication Sig Start Date End Date Taking? Authorizing Provider  ACCU-CHEK AVIVA PLUS test strip CHECK BLOOD SUGAR THREE  TIMES DAILY 11/06/18   Birdie Sons, MD  amLODipine (NORVASC) 2.5 MG tablet TAKE 1 TABLET BY MOUTH DAILY 05/29/18    Birdie Sons, MD  aspirin (ASPIRIN ADULT LOW STRENGTH) 81 MG EC tablet Take 81 mg by mouth daily. Swallow whole.    [provider]  b complex vitamins tablet Take 1 tablet by mouth daily.    [provider]  Biotin 7500 MCG TABS Take by mouth daily.     [provider]  Cholecalciferol (VITAMIN D3) 2000 UNITS capsule Take 2,000 Units by mouth daily.    [provider]  Coenzyme Q10 (COQ10 PO) Take 1 capsule by mouth daily.    [provider]  Cyanocobalamin (VITAMIN B 12 PO) Take 600 mg by mouth daily.    [provider]  glimepiride (AMARYL) 4 MG tablet TAKE 1 TABLET BY MOUTH EVERY MORNING 06/25/18   Birdie Sons, MD  hydrochlorothiazide (HYDRODIURIL) 25 MG tablet TAKE 1 TABLET(25 MG) BY MOUTH DAILY 06/25/18   Birdie Sons, MD  ibuprofen (ADVIL) 600 MG tablet Take 1 tablet (600 mg total) by mouth every 8 (eight) hours as needed. 12/25/18   Olean Ree, MD  lovastatin (MEVACOR) 40 MG tablet TAKE ONE (1) TABLET AT BEDTIME 04/19/18   Birdie Sons, MD  meclizine (ANTIVERT) 25 MG tablet Take 1 tablet (25 mg total) by mouth 3 (three) times daily as needed for dizziness. 12/28/18   Harvest Dark, MD  ondansetron (ZOFRAN) 4 MG tablet Take 1 tablet (4 mg total) by mouth every 8 (eight) hours as needed for up to 7 days for nausea or vomiting. 01/15/19 01/22/19  Lilia Pro., MD  oxyCODONE (ROXICODONE) 5 MG immediate release tablet  Take 1 tablet (5 mg total) by mouth every 6 (six) hours as needed. 12/25/18 12/25/19  Olean Ree, MD  pioglitazone (ACTOS) 30 MG tablet TAKE 1 TABLET BY MOUTH EVERY DAY 08/24/18   Birdie Sons, MD  vitamin C (ASCORBIC ACID) 500 MG tablet Take 500 mg by mouth daily.    [provider]    Allergies Metformin hcl  Family Hx Family History  Problem Relation Age of Onset  . Alzheimer's disease Mother   . Coronary artery disease Father   . Arthritis Sister        Lupus  . Diabetes Brother         Type 2  . Diabetes Sister        Type 2  . Cancer Sister     Social Hx Social History   Tobacco Use  . Smoking status: Former Smoker    Packs/day: 2.00    Years: 20.00    Pack years: 40.00    Types: Cigarettes    Quit date: 1980    Years since quitting: 40.7  . Smokeless tobacco: Never Used  . Tobacco comment: smoked about 2 packs per day, smoked for about 20 years; Quit in 1980  Substance Use Topics  . Alcohol use: Yes    Alcohol/week: 0.0 standard drinks    Comment: occasional- 1/month (wine)  . Drug use: No     Review of Systems  Constitutional: Negative for fever, chills. Eyes: Negative for visual changes. ENT: + for sore throat. Cardiovascular: Negative for chest pain. Respiratory: + for shortness of breath. Gastrointestinal: Negative for nausea, vomiting.  Genitourinary: Negative for dysuria. Musculoskeletal: Negative for leg swelling. Skin: Negative for rash. Neurological: Negative for for headaches.   Physical Exam  Vital Signs: ED Triage Vitals [01/15/19 1608]  Enc Vitals Group     BP (!) 114/55     Pulse Rate 97     Resp 18     Temp 100.1 F (37.8 C)     Temp Source Oral     SpO2 99 %     Weight 165 lb (74.8 kg)     Height 5\' 7"  (1.702 m)     Head Circumference      Peak Flow      Pain Score 0     Pain Loc      Pain Edu?      Excl. in [place]?     Constitutional: Alert and oriented.  Head: Normocephalic. Atraumatic. Eyes: Conjunctivae clear. Sclera anicteric. Nose: No congestion. No rhinorrhea. Mouth/Throat: Mucous membranes are moist. No erythema of oropharynx. Neck: No stridor.   Cardiovascular: Normal rate, regular rhythm. Extremities well perfused. Respiratory: Normal respiratory effort.  Lungs CTAB. Gastrointestinal: Soft. Non-tender. Non-distended.  Musculoskeletal: No lower extremity edema. No deformities. Neurologic:  Normal speech and language. No gross focal neurologic deficits are appreciated.  Skin: Skin is warm, dry and  intact. No rash noted. Psychiatric: Mood and affect are appropriate for situation.  EKG  Personally reviewed.   Rate: 98 Rhythm: sinus Axis: normal Intervals: WNL No acute ischemic changes No STEMI    Radiology  XR: IMPRESSION:  No acute cardiopulmonary process.    Procedures  Procedure(s) performed (including critical care):  Procedures   Initial Impression / Assessment and Plan / ED Course  78 y.o. female who presents to the ED for sore throat, SOB, cough, abdominal discomfort in setting of known COVID exposure.   Patient's presentation most consistent with COVID. I suspect this is  also the etiology of her elevated d-dimer, as opposed to PE process. She is HDS and in no respiratory distress here with normal O2.   Will obtain basic labs, COVID swab.    Cr slightly increased from baseline, likely dehydration in setting of decreased PO. Receiving IVF. COVID is confirmed positive. This is the likely etiology of her lab abnormalities. Given she is HDS, in no respiratory distress, and her CKD status, do not feel CT imaging is indicated.   Advised supportive care, discussed quarantine procedures, and discussed return precautions. Patient voices understanding and is comfortable w/ plan.     Final Clinical Impression(s) / ED Diagnosis  Final diagnoses:  COVID-19       Note:  This document was prepared using Dragon voice recognition software and may include unintentional dictation errors.   Lilia Pro., MD 01/16/19 [phone]

## 2019-01-22 ENCOUNTER — Ambulatory Visit: Payer: Medicare Other

## 2019-02-04 DIAGNOSIS — I6523 Occlusion and stenosis of bilateral carotid arteries: Secondary | ICD-10-CM | POA: Insufficient documentation

## 2019-02-04 DIAGNOSIS — E1122 Type 2 diabetes mellitus with diabetic chronic kidney disease: Secondary | ICD-10-CM | POA: Diagnosis not present

## 2019-02-04 DIAGNOSIS — E7800 Pure hypercholesterolemia, unspecified: Secondary | ICD-10-CM | POA: Diagnosis not present

## 2019-02-04 DIAGNOSIS — I10 Essential (primary) hypertension: Secondary | ICD-10-CM | POA: Diagnosis not present

## 2019-02-04 DIAGNOSIS — R0602 Shortness of breath: Secondary | ICD-10-CM | POA: Diagnosis not present

## 2019-02-13 ENCOUNTER — Ambulatory Visit
Admission: RE | Admit: 2019-02-13 | Discharge: 2019-02-13 | Disposition: A | Discharge status: Home / Self Care | Payer: Medicare Other | Source: Ambulatory Visit | Attending: Family Medicine | Admitting: Family Medicine

## 2019-02-13 ENCOUNTER — Other Ambulatory Visit: Payer: Self-pay

## 2019-02-13 DIAGNOSIS — G3281 Cerebellar ataxia in diseases classified elsewhere: Secondary | ICD-10-CM | POA: Diagnosis not present

## 2019-02-13 DIAGNOSIS — R42 Dizziness and giddiness: Secondary | ICD-10-CM | POA: Diagnosis not present

## 2019-02-15 ENCOUNTER — Encounter: Payer: Self-pay | Admitting: Family Medicine

## 2019-02-15 ENCOUNTER — Telehealth: Payer: Self-pay

## 2019-02-15 DIAGNOSIS — I69393 Ataxia following cerebral infarction: Secondary | ICD-10-CM | POA: Insufficient documentation

## 2019-02-15 MED ORDER — CLOPIDOGREL BISULFATE 75 MG PO TABS: 75.0000 mg | ORAL_TABLET | Freq: Every day | ORAL | 1 refills | Status: DC

## 2019-02-15 MED ORDER — ROSUVASTATIN CALCIUM 20 MG PO TABS: 20.0000 mg | ORAL_TABLET | Freq: Every day | ORAL | 1 refills | Status: DC

## 2019-02-15 NOTE — Telephone Encounter (Signed)
Patient advised. RX sent to pharmacy. Patient states she is feeling better and does not need home PT at this time. 4 week follow up scheduled.

## 2019-02-15 NOTE — Telephone Encounter (Signed)
-----   Message from Birdie Sons, MD sent at 02/15/2019  1:11 PM EDT ----- MRI shows she had a small stroke in the cerebellum which is likely the cause of her dizziness. Need to start clopidogrel 75mg  once a day, #90, rf x 1 and STOP aspirin when she starts the new medication Need to change lovastatin to rosuvastatin 20mg  once a day, #90, rf x 1 If still having trouble with balance we should order home physical therapy.  Patient needs to schedule office visit in a 4 weeks.

## 2019-02-22 DIAGNOSIS — R0602 Shortness of breath: Secondary | ICD-10-CM | POA: Diagnosis not present

## 2019-02-22 DIAGNOSIS — I6523 Occlusion and stenosis of bilateral carotid arteries: Secondary | ICD-10-CM | POA: Diagnosis not present

## 2019-02-25 DIAGNOSIS — R0602 Shortness of breath: Secondary | ICD-10-CM | POA: Diagnosis not present

## 2019-02-25 DIAGNOSIS — I6523 Occlusion and stenosis of bilateral carotid arteries: Secondary | ICD-10-CM | POA: Diagnosis not present

## 2019-02-25 DIAGNOSIS — I6381 Other cerebral infarction due to occlusion or stenosis of small artery: Secondary | ICD-10-CM | POA: Diagnosis not present

## 2019-03-18 NOTE — Progress Notes (Signed)
Patient: Rachel Lester Female    DOB: [birth year]   78 y.o.   MRN: [id number] Visit Date: 03/19/2019  Today's Provider: Lelon Huh, MD   Chief Complaint  Patient presents with  . Cerebellar Ataxia   Subjective:     HPI  Follow up for Cerebellar Ataxia:  The patient was last seen for this 1 months ago. Changes made at last visit include starting clopidogrel 75mg  once a day and discontinuing Aspirin, due to MRI revealing small stroke in the cerebellum. We also changed lovastatin to rosuvastatin 20mg  once a day. She had been having difficulty with balance when walking and some visual disturbance, but reports that these sx have now completely resolved.   She reports good compliance with treatment. She feels that condition is Improved, but still having intermittent headaches. Has been having more headaches in temples 2-3 times a week. States they don't last long and not associated with any other neurological sx. The usually occur in the temples and can occur on either side.  She is not having side effects.   Of note is that she has been off of hctz. She states Dr. Nehemiah Massed had her stop due to very low blood pressure, apparently around the time she was diagnosed with Covid-19 in September.  Patient states she has been checking BP nearly every day at home and SBP has been consistently in the 120s and 130s.   ------------------------------------------------------------------------------------  Allergies  Allergen Reactions  . Metformin Hcl     Weight Loss-anorexia      Current Outpatient Medications:  .  ACCU-CHEK AVIVA PLUS test strip, CHECK BLOOD SUGAR THREE  TIMES DAILY, Disp: 300 strip, Rfl: 4 .  amLODipine (NORVASC) 2.5 MG tablet, TAKE 1 TABLET BY MOUTH DAILY, Disp: 30 tablet, Rfl: 11 .  b complex vitamins tablet, Take 1 tablet by mouth daily., Disp: , Rfl:  .  Biotin 7500 MCG TABS, Take by mouth daily. , Disp: , Rfl:  .  Cholecalciferol (VITAMIN D3) 2000 UNITS  capsule, Take 2,000 Units by mouth daily., Disp: , Rfl:  .  clopidogrel (PLAVIX) 75 MG tablet, Take 1 tablet (75 mg total) by mouth daily., Disp: 90 tablet, Rfl: 1 .  Coenzyme Q10 (COQ10 PO), Take 1 capsule by mouth daily., Disp: , Rfl:  .  Cyanocobalamin (VITAMIN B 12 PO), Take 600 mg by mouth daily., Disp: , Rfl:  .  glimepiride (AMARYL) 4 MG tablet, TAKE 1 TABLET BY MOUTH EVERY MORNING, Disp: 30 tablet, Rfl: 12 .  ibuprofen (ADVIL) 600 MG tablet, Take 1 tablet (600 mg total) by mouth every 8 (eight) hours as needed., Disp: 30 tablet, Rfl: 0 .  meclizine (ANTIVERT) 25 MG tablet, Take 1 tablet (25 mg total) by mouth 3 (three) times daily as needed for dizziness., Disp: 30 tablet, Rfl: 0 .  oxyCODONE (ROXICODONE) 5 MG immediate release tablet, Take 1 tablet (5 mg total) by mouth every 6 (six) hours as needed., Disp: 15 tablet, Rfl: 0 .  pioglitazone (ACTOS) 30 MG tablet, TAKE 1 TABLET BY MOUTH EVERY DAY, Disp: 30 tablet, Rfl: 12 .  rosuvastatin (CRESTOR) 20 MG tablet, Take 1 tablet (20 mg total) by mouth daily., Disp: 90 tablet, Rfl: 1 .  vitamin C (ASCORBIC ACID) 500 MG tablet, Take 500 mg by mouth daily., Disp: , Rfl:  .  aspirin (ASPIRIN ADULT LOW STRENGTH) 81 MG EC tablet, Take 81 mg by mouth daily. Swallow whole., Disp: , Rfl:  .  hydrochlorothiazide (  HYDRODIURIL) 25 MG tablet, TAKE 1 TABLET(25 MG) BY MOUTH DAILY (Patient not taking: Reported on 03/19/2019), Disp: 30 tablet, Rfl: 12 .  lovastatin (MEVACOR) 40 MG tablet, TAKE ONE (1) TABLET AT BEDTIME (Patient not taking: Reported on 03/19/2019), Disp: 90 tablet, Rfl: 3  Review of Systems  Constitutional: Negative for appetite change, chills, fatigue and fever.  Respiratory: Negative for chest tightness and shortness of breath.   Cardiovascular: Negative for chest pain and palpitations.  Gastrointestinal: Negative for abdominal pain, nausea and vomiting.  Neurological: Positive for headaches. Negative for dizziness and weakness.    Social  History   Tobacco Use  . Smoking status: Former Smoker    Packs/day: 2.00    Years: 20.00    Pack years: 40.00    Types: Cigarettes    Quit date: 1980    Years since quitting: 40.9  . Smokeless tobacco: Never Used  . Tobacco comment: smoked about 2 packs per day, smoked for about 20 years; Quit in 1980  Substance Use Topics  . Alcohol use: Yes    Alcohol/week: 0.0 standard drinks    Comment: occasional- 1/month (wine)      Objective:    Vitals:   03/19/19 0944 03/19/19 1017  BP: (!) 165/70 (!) 142/72  Pulse: 87   Temp: (!) 97.1 F (36.2 C)   TempSrc: Temporal   Weight: 173 lb (78.5 kg)   Body mass index is 27.1 kg/m.   Physical Exam   General Appearance:    Well developed, well nourished female in no acute distress  Eyes:    PERRL, conjunctiva/corneas clear, EOM's intact       Lungs:     Clear to auscultation bilaterally, respirations unlabored  Heart:    Normal heart rate. Normal rhythm. No murmurs, rubs, or gallops.   MS:   All extremities are intact.   Neurologic:   Awake, alert, oriented x 3. No apparent focal neurological           defect.            Assessment & Plan    1. 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 Symptoms have resolved.  - Comprehensive metabolic panel - Lipid panel (fasting) - CBC  2. 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   3. Essential hypertension Off hctz apparently due to BP being too low. She reports consistently good BP readings at home and is to continue to monitor.   4. Hypercholesterolemia Tolerating change from pravastatin to rosuvastatin well with no adverse effects.  - Comprehensive metabolic panel - Lipid panel (fasting)  5. 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  Dispense: 45 tablet; Refill: 3  6. Frequent headaches Advised she can take occasional tylenol, but to avoid aspirin and ibuprofen. Or brief and mild at this time and not associated with any neurological symptoms      Lelon Huh, MD  [place]

## 2019-03-19 ENCOUNTER — Ambulatory Visit (INDEPENDENT_AMBULATORY_CARE_PROVIDER_SITE_OTHER): Payer: Medicare Other | Admitting: Family Medicine

## 2019-03-19 ENCOUNTER — Encounter: Payer: Self-pay | Admitting: Family Medicine

## 2019-03-19 ENCOUNTER — Other Ambulatory Visit: Payer: Self-pay

## 2019-03-19 VITALS — BP 142/72 | HR 87 | Temp 97.1°F | Wt 173.0 lb

## 2019-03-19 DIAGNOSIS — I6381 Other cerebral infarction due to occlusion or stenosis of small artery: Secondary | ICD-10-CM

## 2019-03-19 DIAGNOSIS — I69393 Ataxia following cerebral infarction: Secondary | ICD-10-CM | POA: Diagnosis not present

## 2019-03-19 DIAGNOSIS — E7800 Pure hypercholesterolemia, unspecified: Secondary | ICD-10-CM | POA: Diagnosis not present

## 2019-03-19 DIAGNOSIS — I10 Essential (primary) hypertension: Secondary | ICD-10-CM | POA: Diagnosis not present

## 2019-03-19 DIAGNOSIS — E113311 Type 2 diabetes mellitus with moderate nonproliferative diabetic retinopathy with macular edema, right eye: Secondary | ICD-10-CM | POA: Diagnosis not present

## 2019-03-19 DIAGNOSIS — R519 Headache, unspecified: Secondary | ICD-10-CM

## 2019-03-19 MED ORDER — PIOGLITAZONE HCL 45 MG PO TABS: 45.0000 mg | ORAL_TABLET | Freq: Every day | ORAL | 3 refills | Status: DC

## 2019-03-19 NOTE — Patient Instructions (Addendum)
.   Please review the attached list of medications and notify my office if there are any errors.   . Please bring all of your medications to every appointment so we can make sure that our medication list is the same as yours.   . You can take 2 Tylenol every six hours as needed for headaches.

## 2019-03-20 LAB — LIPID PANEL
Chol/HDL Ratio: 3 ratio (ref 0.0–4.4)
Cholesterol, Total: 158 mg/dL (ref 100–199)
HDL: 53 mg/dL (ref >39)
LDL Chol Calc (NIH): 86 mg/dL (ref 0–99)
Triglycerides: 105 mg/dL (ref 0–149)
VLDL Cholesterol Cal: 19 mg/dL (ref 5–40)

## 2019-03-20 LAB — CBC
Hematocrit: 31.1 % — ABNORMAL LOW (ref 34.0–46.6)
Hemoglobin: 10.1 g/dL — ABNORMAL LOW (ref 11.1–15.9)
MCH: 29.9 pg (ref 26.6–33.0)
MCHC: 32.5 g/dL (ref 31.5–35.7)
MCV: 92 fL (ref 79–97)
Platelets: 237 10*3/uL (ref 150–450)
RBC: 3.38 x10E6/uL — ABNORMAL LOW (ref 3.77–5.28)
RDW: 13.2 % (ref 11.7–15.4)
WBC: 4.5 10*3/uL (ref 3.4–10.8)

## 2019-03-20 LAB — COMPREHENSIVE METABOLIC PANEL
ALT: 20 IU/L (ref 0–32)
AST: 21 IU/L (ref 0–40)
Albumin/Globulin Ratio: 1.7 (ref 1.2–2.2)
Albumin: 4.3 g/dL (ref 3.7–4.7)
Alkaline Phosphatase: 81 IU/L (ref 39–117)
BUN/Creatinine Ratio: 20 (ref 12–28)
BUN: 28 mg/dL — ABNORMAL HIGH (ref 8–27)
Bilirubin Total: <0.2 mg/dL (ref 0.0–1.2)
CO2: 21 mmol/L (ref 20–29)
Calcium: 9.4 mg/dL (ref 8.7–10.3)
Chloride: 104 mmol/L (ref 96–106)
Creatinine, Ser: 1.43 mg/dL — ABNORMAL HIGH (ref 0.57–1.00)
GFR calc Af Amer: 41 mL/min/{1.73_m2} — ABNORMAL LOW (ref >59)
GFR calc non Af Amer: 35 mL/min/{1.73_m2} — ABNORMAL LOW (ref >59)
Globulin, Total: 2.6 g/dL (ref 1.5–4.5)
Glucose: 240 mg/dL — ABNORMAL HIGH (ref 65–99)
Potassium: 5.9 mmol/L — ABNORMAL HIGH (ref 3.5–5.2)
Sodium: 138 mmol/L (ref 134–144)
Total Protein: 6.9 g/dL (ref 6.0–8.5)

## 2019-03-22 ENCOUNTER — Telehealth: Payer: Self-pay

## 2019-03-22 NOTE — Telephone Encounter (Signed)
-----   Message from Birdie Sons, MD sent at 03/20/2019  8:10 AM EST ----- Cholesterol is well controlled with change to rosuvastatin, rest of labs are normal. Continue current medications.  Follow up office visit and BP check in 3-4 months.

## 2019-03-22 NOTE — Telephone Encounter (Signed)
LMTCB, ok ay for PEC to advised and scheduled appointment. Patient does not need to keep appointment on 12/11.

## 2019-03-22 NOTE — Telephone Encounter (Signed)
Pt. Returned call for lab results.  Advised of result note per Dr. Caryn Section on 03/20/19.  Verb. Understanding.  Pt. to f/u in 3-4 months with BP check.  F/U appt. rescheduled from 03/29/19 to 06/25/19.  Pt. Agreed with plan.

## 2019-03-28 DIAGNOSIS — Z23 Encounter for immunization: Secondary | ICD-10-CM | POA: Diagnosis not present

## 2019-03-29 ENCOUNTER — Ambulatory Visit: Payer: Self-pay | Admitting: Family Medicine

## 2019-05-01 DIAGNOSIS — E1129 Type 2 diabetes mellitus with other diabetic kidney complication: Secondary | ICD-10-CM | POA: Diagnosis not present

## 2019-05-01 DIAGNOSIS — D631 Anemia in chronic kidney disease: Secondary | ICD-10-CM | POA: Diagnosis not present

## 2019-05-01 DIAGNOSIS — N1832 Chronic kidney disease, stage 3b: Secondary | ICD-10-CM | POA: Diagnosis not present

## 2019-05-01 DIAGNOSIS — I10 Essential (primary) hypertension: Secondary | ICD-10-CM | POA: Diagnosis not present

## 2019-05-01 DIAGNOSIS — R600 Localized edema: Secondary | ICD-10-CM | POA: Diagnosis not present

## 2019-05-01 DIAGNOSIS — R8290 Unspecified abnormal findings in urine: Secondary | ICD-10-CM | POA: Diagnosis not present

## 2019-05-10 ENCOUNTER — Telehealth: Payer: Self-pay | Admitting: Family Medicine

## 2019-05-10 MED ORDER — CLOPIDOGREL BISULFATE 75 MG PO TABS: 75.0000 mg | ORAL_TABLET | Freq: Every day | ORAL | 3 refills | Status: DC

## 2019-05-10 MED ORDER — ROSUVASTATIN CALCIUM 20 MG PO TABS: 20.0000 mg | ORAL_TABLET | Freq: Every day | ORAL | 3 refills | Status: DC

## 2019-05-10 NOTE — Telephone Encounter (Signed)
[facility] faxed refill request for the following medications:  clopidogrel (PLAVIX) 75 MG tablet  rosuvastatin (CRESTOR) 20 MG tablet   hydrochlorothiazide (HYDRODIURIL) 25 MG tablet    Please advise.

## 2019-06-07 ENCOUNTER — Other Ambulatory Visit: Payer: Self-pay | Admitting: Family Medicine

## 2019-06-07 DIAGNOSIS — I10 Essential (primary) hypertension: Secondary | ICD-10-CM

## 2019-06-24 DIAGNOSIS — E113311 Type 2 diabetes mellitus with moderate nonproliferative diabetic retinopathy with macular edema, right eye: Secondary | ICD-10-CM | POA: Diagnosis not present

## 2019-06-24 NOTE — Progress Notes (Signed)
Patient: Rachel Lester Female    DOB: Feb 10, 1941   79 y.o.   MRN: [id number] Visit Date: 06/25/2019  Today's Provider: Lelon Huh, MD   Chief Complaint  Patient presents with  . Diabetes  . Hypertension   Subjective:     HPI  Hypertension, follow-up:  BP Readings from Last 3 Encounters:  06/25/19 (!) 148/69  03/19/19 (!) 142/72  01/15/19 (!) 148/59    She was last seen for hypertension 3 months ago.  BP at that visit was 142/72. Management since that visit includes no changes. Patient advised to continue monitoring home blood pressure readings. She reports good compliance with treatment. She is not having side effects.  She is exercising. She is adherent to low salt diet.   Outside blood pressures are not being checked at home. She is experiencing none.  Patient denies chest pain, chest pressure/discomfort, irregular heart beat and palpitations.   Cardiovascular risk factors include advanced age (older than 44 for men, 63 for women), diabetes mellitus, dyslipidemia and hypertension.  Use of agents associated with hypertension: NSAIDS.     Weight trend: stable Wt Readings from Last 3 Encounters:  06/25/19 178 lb 6.4 oz (80.9 kg)  03/19/19 173 lb (78.5 kg)  01/15/19 165 lb (74.8 kg)    Current diet: well balanced  ------------------------------------------------------------------------  Diabetes Mellitus Type II, Follow-up:   Lab Results  Component Value Date   HGBA1C 7.7 (H) 11/23/2018   HGBA1C 7.2 (A) 09/12/2018   HGBA1C 7.5 (A) 04/03/2018    Last seen for diabetes 3 months ago.  Management since then includes increased Actos to 45mg  daily. She reports good compliance with treatment. She is not having side effects.  Current symptoms include none  Home blood sugar records: fasting range: 200's She had Covid in September and sugars have been considerable higher ever since. Episodes of hypoglycemia? no   Current insulin regiment: Is not on  insulin Most Recent Eye Exam: 12/25/2018 Weight trend: stable Prior visit with dietician: Yes  Current exercise: cardiovascular workout on exercise equipment Current diet habits: well balanced  Pertinent Labs:    Component Value Date/Time   CHOL 158 03/19/2019 1036   TRIG 105 03/19/2019 1036   HDL 53 03/19/2019 1036   LDLCALC 86 03/19/2019 1036   CREATININE 1.43 (H) 03/19/2019 1036   CREATININE 1.23 05/12/2013 0803    Wt Readings from Last 3 Encounters:  06/25/19 178 lb 6.4 oz (80.9 kg)  03/19/19 173 lb (78.5 kg)  01/15/19 165 lb (74.8 kg)    ------------------------------------------------------------------------  Allergies  Allergen Reactions  . Metformin Hcl     Weight Loss-anorexia      Current Outpatient Medications:  .  ACCU-CHEK AVIVA PLUS test strip, CHECK BLOOD SUGAR THREE  TIMES DAILY, Disp: 300 strip, Rfl: 4 .  amLODipine (NORVASC) 2.5 MG tablet, TAKE 1 TABLET BY MOUTH  DAILY, Disp: 90 tablet, Rfl: 0 .  b complex vitamins tablet, Take 1 tablet by mouth daily., Disp: , Rfl:  .  Biotin 7500 MCG TABS, Take by mouth daily. , Disp: , Rfl:  .  Cholecalciferol (VITAMIN D3) 2000 UNITS capsule, Take 2,000 Units by mouth daily., Disp: , Rfl:  .  clopidogrel (PLAVIX) 75 MG tablet, Take 1 tablet (75 mg total) by mouth daily., Disp: 90 tablet, Rfl: 3 .  Coenzyme Q10 (COQ10 PO), Take 1 capsule by mouth daily., Disp: , Rfl:  .  glimepiride (AMARYL) 4 MG tablet, TAKE 1 TABLET BY  MOUTH  EVERY MORNING, Disp: 90 tablet, Rfl: 0 .  pioglitazone (ACTOS) 45 MG tablet, Take 1 tablet (45 mg total) by mouth daily., Disp: 45 tablet, Rfl: 3 .  rosuvastatin (CRESTOR) 20 MG tablet, Take 1 tablet (20 mg total) by mouth daily., Disp: 90 tablet, Rfl: 3 .  vitamin C (ASCORBIC ACID) 500 MG tablet, Take 500 mg by mouth daily., Disp: , Rfl:  .  lovastatin (MEVACOR) 40 MG tablet, TAKE ONE (1) TABLET AT BEDTIME (Patient not taking: Reported on 06/25/2019), Disp: 90 tablet, Rfl: 3  Review of Systems    Constitutional: Negative.   Respiratory: Negative.   Cardiovascular: Negative.   Musculoskeletal: Negative.     Social History   Tobacco Use  . Smoking status: Former Smoker    Packs/day: 2.00    Years: 20.00    Pack years: 40.00    Types: Cigarettes    Quit date: 1980    Years since quitting: 41.2  . Smokeless tobacco: Never Used  . Tobacco comment: smoked about 2 packs per day, smoked for about 20 years; Quit in 1980  Substance Use Topics  . Alcohol use: Yes    Alcohol/week: 0.0 standard drinks    Comment: occasional- 1/month (wine)      Objective:   BP (!) 148/69 (BP Location: Right Arm, Patient Position: Sitting, Cuff Size: Normal)   Pulse 86   Temp (!) 97.1 F (36.2 C) (Temporal)   Wt 178 lb 6.4 oz (80.9 kg)   BMI 27.94 kg/m  Vitals:   06/25/19 1007  BP: (!) 148/69  Pulse: 86  Temp: (!) 97.1 F (36.2 C)  TempSrc: Temporal  Weight: 178 lb 6.4 oz (80.9 kg)  Body mass index is 27.94 kg/m.   Physical Exam  General appearance:  Overweight female, cooperative and in no acute distress Head: Normocephalic, without obvious abnormality, atraumatic Respiratory: Respirations even and unlabored, normal respiratory rate Extremities: All extremities are intact.  Skin: Skin color, texture, turgor normal. No rashes seen  Psych: Appropriate mood and affect. Neurologic: Mental status: Alert, oriented to person, place, and time, thought content appropriate.   Results for orders placed or performed in visit on 06/25/19  POCT HgB A1C  Result Value Ref Range   Hemoglobin A1C 9.8 (A) 4.0 - 5.6 %   Est. average glucose Bld gHb Est-mCnc 235        Assessment & Plan    1. Type 2 diabetes mellitus with stage 3b chronic kidney disease, without long-term current use of insulin ([place]) Uncontrolled since sugars have been elevated after recovering from Covid in September 2020.  She was previously on metformin many years ago which was discontinued due to excessive loss of  weight. She is willing to go back on medication so will restart at low dose.   - metFORMIN (GLUCOPHAGE) 500 MG tablet; Take 1 tablet (500 mg total) by mouth daily with supper.  Dispense: 90 tablet; Refill: 1  Follow up in 3 months.    The entirety of the information documented in the History of Present Illness, Review of Systems and Physical Exam were personally obtained by me. Portions of this information were initially documented by Idelle Jo, CMA and reviewed by me for thoroughness and accuracy.   Lelon Huh, MD  [facility]

## 2019-06-25 ENCOUNTER — Other Ambulatory Visit: Payer: Self-pay | Admitting: Family Medicine

## 2019-06-25 ENCOUNTER — Encounter: Payer: Self-pay | Admitting: Family Medicine

## 2019-06-25 ENCOUNTER — Other Ambulatory Visit: Payer: Self-pay

## 2019-06-25 ENCOUNTER — Ambulatory Visit (INDEPENDENT_AMBULATORY_CARE_PROVIDER_SITE_OTHER): Payer: Medicare Other | Admitting: Family Medicine

## 2019-06-25 VITALS — BP 148/69 | HR 86 | Temp 97.1°F | Wt 178.4 lb

## 2019-06-25 DIAGNOSIS — E7800 Pure hypercholesterolemia, unspecified: Secondary | ICD-10-CM | POA: Diagnosis not present

## 2019-06-25 DIAGNOSIS — E1122 Type 2 diabetes mellitus with diabetic chronic kidney disease: Secondary | ICD-10-CM

## 2019-06-25 DIAGNOSIS — N1832 Chronic kidney disease, stage 3b: Secondary | ICD-10-CM | POA: Diagnosis not present

## 2019-06-25 DIAGNOSIS — I6381 Other cerebral infarction due to occlusion or stenosis of small artery: Secondary | ICD-10-CM | POA: Diagnosis not present

## 2019-06-25 DIAGNOSIS — I6523 Occlusion and stenosis of bilateral carotid arteries: Secondary | ICD-10-CM | POA: Diagnosis not present

## 2019-06-25 DIAGNOSIS — N1831 Chronic kidney disease, stage 3a: Secondary | ICD-10-CM | POA: Diagnosis not present

## 2019-06-25 DIAGNOSIS — I10 Essential (primary) hypertension: Secondary | ICD-10-CM | POA: Diagnosis not present

## 2019-06-25 DIAGNOSIS — E1121 Type 2 diabetes mellitus with diabetic nephropathy: Secondary | ICD-10-CM

## 2019-06-25 LAB — POCT GLYCOSYLATED HEMOGLOBIN (HGB A1C)
Est. average glucose Bld gHb Est-mCnc: 235
Hemoglobin A1C: 9.8 % — AB (ref 4.0–5.6)

## 2019-06-25 MED ORDER — METFORMIN HCL 500 MG PO TABS: 500.0000 mg | ORAL_TABLET | Freq: Every day | ORAL | 1 refills | Status: DC

## 2019-06-25 NOTE — Patient Instructions (Addendum)
.   Please review the attached list of medications and notify my office if there are any errors.   . Please bring all of your medications to every appointment so we can make sure that our medication list is the same as yours.    People who get Covid should get the Covid-19 vaccine 90 days afterword, so you are now eligible and should get the Covid vaccine.

## 2019-06-25 NOTE — Telephone Encounter (Signed)
Requested medication (s) are due for refill today: yes  Requested medication (s) are on the active medication list:no  Last refill:  08/24/2018  Future visit scheduled: yes  Notes to clinic:  not on active med list; d/c'd 03/19/2019   Requested Prescriptions  Pending Prescriptions Disp Refills   hydrochlorothiazide (HYDRODIURIL) 25 MG tablet [Pharmacy Med Name: HYDROCHLOROTHIAZIDE 25MG  TABLETS] 30 tablet 12    Sig: TAKE 1 TABLET(25 MG) BY MOUTH DAILY      Cardiovascular: Diuretics - Thiazide Failed - 06/25/2019  8:44 AM      Failed - Cr in normal range and within 360 days    Creatinine  Date Value Ref Range Status  05/12/2013 1.23 0.60 - 1.30 mg/dL Final   Creatinine, Ser  Date Value Ref Range Status  03/19/2019 1.43 (H) 0.57 - 1.00 mg/dL Final   Creatinine, POC  Date Value Ref Range Status  01/31/2017 n/a mg/dL Final          Failed - K in normal range and within 360 days    Potassium  Date Value Ref Range Status  03/19/2019 5.9 (H) 3.5 - 5.2 mmol/L Final  05/12/2013 4.7 3.5 - 5.1 mmol/L Final          Failed - Last BP in normal range    BP Readings from Last 1 Encounters:  03/19/19 (!) 142/72          Passed - Ca in normal range and within 360 days    Calcium  Date Value Ref Range Status  03/19/2019 9.4 8.7 - 10.3 mg/dL Final   Calcium, Total  Date Value Ref Range Status  05/12/2013 8.5 8.5 - 10.1 mg/dL Final          Passed - Na in normal range and within 360 days    Sodium  Date Value Ref Range Status  03/19/2019 138 134 - 144 mmol/L Final  05/12/2013 138 136 - 145 mmol/L Final          Passed - Valid encounter within last 6 months    Recent Outpatient Visits           3 months ago Cerebrovascular accident (CVA) due to occlusion of small artery ([place])   [facility] Birdie Sons, MD   5 months ago Need for influenza vaccination   [facility] Birdie Sons, MD   7 months ago Annual physical exam   [facility] Birdie Sons, MD   9 months ago Type 2 diabetes mellitus with stage 3 chronic kidney disease, without long-term current use of insulin ([place])   [facility] Birdie Sons, MD   1 year ago Serum potassium elevated   [facility] Birdie Sons, MD       Future Appointments             Today Caryn Section, Kirstie Peri, MD [facility]             Refused Prescriptions Disp Refills   glimepiride (AMARYL) 4 MG tablet [Pharmacy Med Name: GLIMEPIRIDE 4MG  TABLETS] 30 tablet     Sig: TAKE 1 TABLET BY MOUTH EVERY MORNING      Endocrinology:  Diabetes - Sulfonylureas Failed - 06/25/2019  8:44 AM      Failed - HBA1C is between 0 and 7.9 and within 180 days    Hgb A1c MFr Bld  Date Value Ref Range Status  11/23/2018 7.7 (H) 4.8 - 5.6 % Final  Comment:             Prediabetes: 5.7 - 6.4          Diabetes: >6.4          Glycemic control for adults with diabetes: <7.0           Passed - Valid encounter within last 6 months    Recent Outpatient Visits           3 months ago Cerebrovascular accident (CVA) due to occlusion of small artery [facility])   [place], Donald E, MD   5 months ago Need for influenza vaccination   [facility] Birdie Sons, MD   7 months ago Annual physical exam   [facility] Birdie Sons, MD   9 months ago Type 2 diabetes mellitus with stage 3 chronic kidney disease, without long-term current use of insulin [facility])   [facility] Birdie Sons, MD   1 year ago Serum potassium elevated   [facility] Birdie Sons, MD       Future Appointments             Today Caryn Section, Kirstie Peri, MD [facility], [place]

## 2019-07-11 ENCOUNTER — Other Ambulatory Visit: Payer: Self-pay | Admitting: Family Medicine

## 2019-07-11 DIAGNOSIS — E113311 Type 2 diabetes mellitus with moderate nonproliferative diabetic retinopathy with macular edema, right eye: Secondary | ICD-10-CM

## 2019-08-01 ENCOUNTER — Other Ambulatory Visit: Payer: Self-pay | Admitting: Family Medicine

## 2019-08-01 DIAGNOSIS — I10 Essential (primary) hypertension: Secondary | ICD-10-CM

## 2019-08-14 ENCOUNTER — Telehealth: Payer: Self-pay | Admitting: Family Medicine

## 2019-08-14 NOTE — Chronic Care Management (AMB) (Signed)
  Chronic Care Management   Outreach Note  08/14/2019 Name: Rachel Lester MRN: [id number] DOB: 1940/06/07  Rachel Lester is a 79 y.o. year old female who is a primary care patient of Fisher, Kirstie Peri, MD. I reached out to Rachel Lester by phone today in response to a referral sent by Ms. Raeley A Brau's health plan.     An unsuccessful telephone outreach was attempted today. The patient was referred to the case management team for assistance with care management and care coordination.   Follow Up Plan: A HIPPA compliant phone message was left for the patient providing contact information and requesting a return call.  The care management team will reach out to the patient again over the next 7 days.  If patient returns call to provider office, please advise to call [facility]  at [place], [place], [place], [place] [address] Direct Dial: [phone] [email or web address] Website: [email or web address]

## 2019-08-15 NOTE — Chronic Care Management (AMB) (Signed)
  Chronic Care Management   Note  08/15/2019 Name: Rachel Lester MRN: [id number] DOB: October 29, 1940  Rachel Lester is a 79 y.o. year old female who is a primary care patient of Fisher, Kirstie Peri, MD. I reached out to Rachel Lester by phone today in response to a referral sent by Ms. Liandra A Madej's health plan.     Ms. Thumma was given information about Chronic Care Management services today including:  1. CCM service includes personalized support from designated clinical staff supervised by her physician, including individualized plan of care and coordination with other care providers 2. 24/7 contact phone numbers for assistance for urgent and routine care needs. 3. Service will only be billed when office clinical staff spend 20 minutes or more in a month to coordinate care. 4. Only one practitioner may furnish and bill the service in a calendar month. 5. The patient may stop CCM services at any time (effective at the end of the month) by phone call to the office staff. 6. The patient will be responsible for cost sharing (co-pay) of up to 20% of the service fee (after annual deductible is met).  Patient did not agree to enrollment in care management services and does not wish to consider at this time.  Follow up plan: The patient has been provided with contact information for the care management team and has been advised to call with any health related questions or concerns.   Noreene Larsson, [place], [place], [place] [address] Direct Dial: [phone] [email or web address] Website: [email or web address]

## 2019-08-24 ENCOUNTER — Other Ambulatory Visit: Payer: Self-pay | Admitting: Family Medicine

## 2019-08-24 DIAGNOSIS — I10 Essential (primary) hypertension: Secondary | ICD-10-CM

## 2019-08-29 DIAGNOSIS — N1832 Chronic kidney disease, stage 3b: Secondary | ICD-10-CM | POA: Diagnosis not present

## 2019-08-29 DIAGNOSIS — I10 Essential (primary) hypertension: Secondary | ICD-10-CM | POA: Diagnosis not present

## 2019-08-29 DIAGNOSIS — D631 Anemia in chronic kidney disease: Secondary | ICD-10-CM | POA: Diagnosis not present

## 2019-08-29 DIAGNOSIS — R600 Localized edema: Secondary | ICD-10-CM | POA: Diagnosis not present

## 2019-08-29 DIAGNOSIS — E1129 Type 2 diabetes mellitus with other diabetic kidney complication: Secondary | ICD-10-CM | POA: Diagnosis not present

## 2019-09-06 NOTE — Progress Notes (Signed)
Established patient visit   Patient: Rachel Lester   DOB: 1941/01/09   79 y.o. Female  MRN: [id number] Visit Date: 09/09/2019  Today's healthcare provider: Lelon Huh, MD   Chief Complaint  Patient presents with  . Diabetes  . Hypertension   [employer] as a scribe for Lelon Huh, MD.,have documented all relevant documentation on the behalf of Lelon Huh, MD,as directed by  Lelon Huh, MD while in the presence of Lelon Huh, MD.  Subjective    HPI  Diabetes Mellitus Type II, Follow-up  Lab Results  Component Value Date   HGBA1C 9.8 (A) 06/25/2019   HGBA1C 7.7 (H) 11/23/2018   HGBA1C 7.2 (A) 09/12/2018   Wt Readings from Last 3 Encounters:  09/09/19 169 lb 9.6 oz (76.9 kg)  06/25/19 178 lb 6.4 oz (80.9 kg)  03/19/19 173 lb (78.5 kg)   Last seen for diabetes 3 months ago.  Management since then includes restarted metformin. She reports good compliance with treatment. She is not having side effects.  Symptoms: No fatigue No foot ulcerations  No appetite changes No nausea  No paresthesia of the feet  No polydipsia  No polyuria No visual disturbances   No vomiting     Home blood sugar records: fasting range: averaging 160's  Episodes of hypoglycemia? Yes several   Current insulin regiment: None Most Recent Eye Exam: 12/25/2018 Current exercise: cardiovascular workout on exercise equipment Current diet habits: in general, a "healthy" diet    Pertinent Labs: Lab Results  Component Value Date   CHOL 158 03/19/2019   HDL 53 03/19/2019   LDLCALC 86 03/19/2019   TRIG 105 03/19/2019   CHOLHDL 3.0 03/19/2019   Lab Results  Component Value Date   NA 138 03/19/2019   K 5.9 (H) 03/19/2019   CREATININE 1.43 (H) 03/19/2019   GFRNONAA 35 (L) 03/19/2019   GFRAA 41 (L) 03/19/2019   GLUCOSE 240 (H) 03/19/2019     --------------------------------------------------------------------------------------------------- Hypertension,  follow-up  BP Readings from Last 3 Encounters:  09/09/19 (!) 151/72  06/25/19 (!) 148/69  03/19/19 (!) 142/72   Wt Readings from Last 3 Encounters:  09/09/19 169 lb 9.6 oz (76.9 kg)  06/25/19 178 lb 6.4 oz (80.9 kg)  03/19/19 173 lb (78.5 kg)     She was last seen for hypertension 9 months ago.  BP at that visit was see above. Management since that visit includes no change.  She reports good compliance with treatment. She is not having side effects.  She is following a Regular diet. She is exercising. She does not smoke.  Use of agents associated with hypertension: none.   Outside blood pressures are being checked occasionally. Symptoms: No chest pain No chest pressure  No palpitations No syncope  No dyspnea No orthopnea  No paroxysmal nocturnal dyspnea No lower extremity edema   Pertinent labs: Lab Results  Component Value Date   CHOL 158 03/19/2019   HDL 53 03/19/2019   LDLCALC 86 03/19/2019   TRIG 105 03/19/2019   CHOLHDL 3.0 03/19/2019   Lab Results  Component Value Date   NA 138 03/19/2019   K 5.9 (H) 03/19/2019   CREATININE 1.43 (H) 03/19/2019   GFRNONAA 35 (L) 03/19/2019   GFRAA 41 (L) 03/19/2019   GLUCOSE 240 (H) 03/19/2019     The ASCVD Risk score Mikey Bussing DC Jr., et al., 2013) failed to calculate for the following reasons:   The patient has a prior MI or stroke diagnosis   ---------------------------------------------------------------------------------------------------  She also states that Dr. Candiss Norse recently prescribed torsemide to take as needed for swelling, but hasn't taken any of it. Yet.       Medications: Outpatient Medications Prior to Visit  Medication Sig Note  . ACCU-CHEK AVIVA PLUS test strip CHECK BLOOD SUGAR THREE  TIMES DAILY   . b complex vitamins tablet Take 1 tablet by mouth daily.   . Biotin 7500 MCG TABS Take by mouth daily.    . Cholecalciferol (VITAMIN D3) 2000 UNITS capsule Take 2,000 Units by mouth daily.   . clopidogrel  (PLAVIX) 75 MG tablet Take 1 tablet (75 mg total) by mouth daily.   . Coenzyme Q10 (COQ10 PO) Take 1 capsule by mouth daily.   Marland Kitchen glimepiride (AMARYL) 4 MG tablet TAKE 1 TABLET BY MOUTH IN  THE MORNING   . metFORMIN (GLUCOPHAGE) 500 MG tablet Take 1 tablet (500 mg total) by mouth daily with supper.   . pioglitazone (ACTOS) 45 MG tablet TAKE 1 TABLET BY MOUTH  DAILY   . rosuvastatin (CRESTOR) 20 MG tablet Take 1 tablet (20 mg total) by mouth daily.   Marland Kitchen torsemide (DEMADEX) 20 MG tablet Take 20 mg by mouth daily as needed.   . vitamin C (ASCORBIC ACID) 500 MG tablet Take 500 mg by mouth daily.   . [DISCONTINUED] amLODipine (NORVASC) 2.5 MG tablet TAKE 1 TABLET BY MOUTH DAILY   . [DISCONTINUED] atorvastatin (LIPITOR) 20 MG tablet Take by mouth. 09/09/2019: was previoulsy changed to rosuvastatin  . [DISCONTINUED] hydrochlorothiazide (HYDRODIURIL) 25 MG tablet TAKE 1 TABLET(25 MG) BY MOUTH DAILY (Patient not taking: Reported on 09/09/2019)    No facility-administered medications prior to visit.    Review of Systems  Constitutional: Negative.   Respiratory: Negative.   Cardiovascular: Negative.   Musculoskeletal: Negative.       Objective    BP (!) 151/72 (BP Location: Right Arm, Patient Position: Sitting, Cuff Size: Normal)   Pulse 82   Temp (!) 96.9 F (36.1 C) (Temporal)   Wt 169 lb 9.6 oz (76.9 kg)   BMI 26.56 kg/m    Physical Exam   General: Appearance:     Overweight female in no acute distress  Eyes:    PERRL, conjunctiva/corneas clear, EOM's intact       Lungs:     Clear to auscultation bilaterally, respirations unlabored  Heart:    Normal heart rate. Normal rhythm. No murmurs, rubs, or gallops.   MS:   All extremities are intact.   Neurologic:   Awake, alert, oriented x 3. No apparent focal neurological           defect.        Results for orders placed or performed in visit on 09/09/19  POCT HgB A1C  Result Value Ref Range   Hemoglobin A1C 7.6 (A) 4.0 - 5.6 %   Est.  average glucose Bld gHb Est-mCnc 171     Assessment & Plan     1. Type 2 diabetes mellitus with stage 3b chronic kidney disease, without long-term current use of insulin (HCC) Much better since restarting low dose metformin. Continue current medications.  Recheck a1c in 4 months.   2. Essential hypertension SBP consistently elevated. Will increase - amLODipine (NORVASC) 5 MG tablet; Take 1 tablet (5 mg total) by mouth daily. PLEASE NOTE CHANGE TO '5MG'$  TABLETS  Dispense: 90 tablet; Refill: 3  3. Chronic kidney disease, stage 3b Continue regular follow up Dr. Candiss Norse, reviewed recent consult notes and labs with  stable eGFR and normal electrolytes. Torsemide was prescription prn swelling, but she has not taken any yet. Expect some swelling with increase in amlodipine for which she can take torsemide if needed.    Return in about 4 months (around 01/10/2020).      The entirety of the information documented in the History of Present Illness, Review of Systems and Physical Exam were personally obtained by me. Portions of this information were initially documented by the CMA and reviewed by me for thoroughness and accuracy.      Lelon Huh, MD  [facility] [phone] (phone) [phone] (fax)  [place]

## 2019-09-09 ENCOUNTER — Ambulatory Visit (INDEPENDENT_AMBULATORY_CARE_PROVIDER_SITE_OTHER): Payer: Medicare Other | Admitting: Family Medicine

## 2019-09-09 ENCOUNTER — Other Ambulatory Visit: Payer: Self-pay

## 2019-09-09 ENCOUNTER — Encounter: Payer: Self-pay | Admitting: Family Medicine

## 2019-09-09 VITALS — BP 151/72 | HR 82 | Temp 96.9°F | Wt 169.6 lb

## 2019-09-09 DIAGNOSIS — E1122 Type 2 diabetes mellitus with diabetic chronic kidney disease: Secondary | ICD-10-CM

## 2019-09-09 DIAGNOSIS — N1832 Chronic kidney disease, stage 3b: Secondary | ICD-10-CM

## 2019-09-09 DIAGNOSIS — E1121 Type 2 diabetes mellitus with diabetic nephropathy: Secondary | ICD-10-CM | POA: Diagnosis not present

## 2019-09-09 DIAGNOSIS — I10 Essential (primary) hypertension: Secondary | ICD-10-CM

## 2019-09-09 LAB — POCT GLYCOSYLATED HEMOGLOBIN (HGB A1C)
Est. average glucose Bld gHb Est-mCnc: 171
Hemoglobin A1C: 7.6 % — AB (ref 4.0–5.6)

## 2019-09-09 MED ORDER — AMLODIPINE BESYLATE 5 MG PO TABS: 5.0000 mg | ORAL_TABLET | Freq: Every day | ORAL | 3 refills | Status: DC

## 2019-09-09 NOTE — Patient Instructions (Addendum)
.   Please review the attached list of medications and notify my office if there are any errors.   . Increase amlodipine to 5mg  daily. You can take 2 of the 2.5mg  tablets every day until they run out, then start taking one 5mg  tablet every day

## 2019-09-26 DIAGNOSIS — Z23 Encounter for immunization: Secondary | ICD-10-CM | POA: Diagnosis not present

## 2019-10-21 ENCOUNTER — Other Ambulatory Visit: Payer: Self-pay | Admitting: Family Medicine

## 2019-11-25 ENCOUNTER — Other Ambulatory Visit: Payer: Self-pay | Admitting: Family Medicine

## 2019-11-25 NOTE — Telephone Encounter (Signed)
Requested Prescriptions  Pending Prescriptions Disp Refills   ACCU-CHEK AVIVA PLUS test strip [Pharmacy Med Name: T Mar[place] Setting ACCU CHK AVIVA PLUS] 300 strip 3    Sig: [place] DAILY     Endocrinology: Diabetes - Testing Supplies Passed - 11/25/2019  9:09 PM      Passed - Valid encounter within last 12 months    Recent Outpatient Visits          2 months ago Type 2 diabetes mellitus with stage 3b chronic kidney disease, without long-term current use of insulin ([place])   [facility] Birdie Sons, MD   5 months ago Type 2 diabetes mellitus with stage 3b chronic kidney disease, without long-term current use of insulin ([place])   [facility] Birdie Sons, MD   8 months ago Cerebrovascular accident (CVA) due to occlusion of small artery [facility])   [facility] Birdie Sons, MD   10 months ago Need for influenza vaccination   [facility] Birdie Sons, MD   1 year ago Annual physical exam   [facility] Birdie Sons, MD      Future Appointments            In 1 month Fisher, Kirstie Peri, MD [facility], [place]

## 2019-11-28 ENCOUNTER — Other Ambulatory Visit: Payer: Self-pay | Admitting: Family Medicine

## 2019-11-28 DIAGNOSIS — E1122 Type 2 diabetes mellitus with diabetic chronic kidney disease: Secondary | ICD-10-CM

## 2019-12-18 NOTE — Progress Notes (Signed)
Subjective:   Rachel Lester is a 79 y.o. female who presents for Medicare Annual (Subsequent) preventive examination.  I connected with Joni Reining today by telephone and verified that I am speaking with the correct person using two identifiers. Location patient: home Location provider: work Persons participating in the virtual visit: patient, provider.   I discussed the limitations, risks, security and privacy concerns of performing an evaluation and management service by telephone and the availability of in person appointments. I also discussed with the patient that there may be a patient responsible charge related to this service. The patient expressed understanding and verbally consented to this telephonic visit.    Interactive audio and video telecommunications were attempted between this provider and patient, however failed, due to patient having technical difficulties OR patient did not have access to video capability.  We continued and completed visit with audio only.   Review of Systems    N/A  Cardiac Risk Factors include: advanced age (>13men, >31 women);diabetes mellitus;dyslipidemia;hypertension     Objective:    There were no vitals filed for this visit. There is no height or weight on file to calculate BMI.  Advanced Directives 12/19/2019 01/15/2019 12/28/2018 12/17/2018 11/17/2017 11/16/2016 01/12/2015  Does Patient Have a Medical Advance Directive? No No No No No No No  Would patient like information on creating a medical advance directive? No - Patient declined No - Patient declined - No - Patient declined Yes (MAU/Ambulatory/Procedural Areas - Information given) No - Patient declined -    Current Medications (verified) Outpatient Encounter Medications as of 12/19/2019  Medication Sig  . ACCU-CHEK AVIVA PLUS test strip CHECK BLOOD SUGAR THREE  TIMES DAILY  . amLODipine (NORVASC) 5 MG tablet Take 1 tablet (5 mg total) by mouth daily. PLEASE NOTE CHANGE TO 5MG  TABLETS  .  b complex vitamins tablet Take 1 tablet by mouth daily.  . Biotin 7500 MCG TABS Take by mouth daily.   . Cholecalciferol (VITAMIN D3) 2000 UNITS capsule Take 2,000 Units by mouth daily.  . clopidogrel (PLAVIX) 75 MG tablet Take 1 tablet (75 mg total) by mouth daily.  Marland Kitchen glimepiride (AMARYL) 4 MG tablet TAKE 1 TABLET BY MOUTH IN  THE MORNING  . Magnesium 500 MG TABS Take by mouth daily.  . metFORMIN (GLUCOPHAGE) 500 MG tablet TAKE 1 TABLET BY MOUTH  DAILY WITH SUPPER  . pioglitazone (ACTOS) 45 MG tablet TAKE 1 TABLET BY MOUTH  DAILY  . rosuvastatin (CRESTOR) 20 MG tablet Take 1 tablet (20 mg total) by mouth daily.  . vitamin C (ASCORBIC ACID) 500 MG tablet Take 500 mg by mouth daily.  Marland Kitchen zinc gluconate 50 MG tablet Take 50 mg by mouth daily.  . Coenzyme Q10 (COQ10 PO) Take 1 capsule by mouth daily. (Patient not taking: Reported on 12/19/2019)  . torsemide (DEMADEX) 20 MG tablet Take 20 mg by mouth daily as needed. (Patient not taking: Reported on 12/19/2019)   No facility-administered encounter medications on file as of 12/19/2019.    Allergies (verified) Metformin hcl   History: Past Medical History:  Diagnosis Date  . COVID-19 12/2018  . Diabetes mellitus without complication ([place])   . Hyperlipidemia   . Hypertension    Past Surgical History:  Procedure Laterality Date  . LIPOMA EXCISION Right 12/25/2018   In-office, right forearm lipomas x 2   Family History  Problem Relation Age of Onset  . Alzheimer's disease Mother   . Coronary artery disease Father   . Arthritis  Sister        Lupus  . Diabetes Brother        Type 2  . Diabetes Sister        Type 2  . Cancer Sister    Social History   Socioeconomic History  . Marital status: Widowed    Spouse name: Not on file  . Number of children: 2  . Years of education: Not on file  . Highest education level: 12th grade  Occupational History  . Occupation: retired  . Occupation: has rental properties  Tobacco Use  . Smoking  status: Former Smoker    Packs/day: 2.00    Years: 20.00    Pack years: 40.00    Types: Cigarettes    Quit date: 1980    Years since quitting: 41.6  . Smokeless tobacco: Never Used  . Tobacco comment: smoked about 2 packs per day, smoked for about 20 years; Quit in [place] Use  . Vaping Use: Never used  Substance and Sexual Activity  . Alcohol use: Yes    Alcohol/week: 0.0 standard drinks    Comment: occasional- 1/month (wine)  . Drug use: No  . Sexual activity: Not on file  Other Topics Concern  . Not on file  Social History Narrative  . Not on file   Social Determinants of Health   Financial Resource Strain: Low Risk   . Difficulty of Paying Living Expenses: Not hard at all  Food Insecurity: No Food Insecurity  . Worried About Charity fundraiser in the Last Year: Never true  . Ran Out of Food in the Last Year: Never true  Transportation Needs: No Transportation Needs  . Lack of Transportation (Medical): No  . Lack of Transportation (Non-Medical): No  Physical Activity: Insufficiently Active  . Days of Exercise per Week: 3 days  . Minutes of Exercise per Session: 10 min  Stress: No Stress Concern Present  . Feeling of Stress : Not at all  Social Connections: Moderately Isolated  . Frequency of Communication with Friends and Family: Once a week  . Frequency of Social Gatherings with Friends and Family: More than three times a week  . Attends Religious Services: More than 4 times per year  . Active Member of Clubs or Organizations: No  . Attends Archivist Meetings: Never  . Marital Status: Widowed    Tobacco Counseling Counseling given: Not Answered Comment: smoked about 2 packs per day, smoked for about 20 years; Quit in 1980   Clinical Intake:  Pre-visit preparation completed: Yes  Pain : No/denies pain     Nutritional Risks: None Diabetes: Yes  How often do you need to have someone help you when you read instructions, pamphlets, or  other written materials from your doctor or pharmacy?: 1 - Never  Diabetic? Yes  Nutrition Risk Assessment:  Has the patient had any N/V/D within the last 2 months?  No  Does the patient have any non-healing wounds?  No  Has the patient had any unintentional weight loss or weight gain?  No   Diabetes:  Is the patient diabetic?  Yes  If diabetic, was a CBG obtained today?  No  Did the patient bring in their glucometer from home?  No  How often do you monitor your CBG's? Once a day.   Financial Strains and Diabetes Management:  Are you having any financial strains with the device, your supplies or your medication? No .  Does the patient want  to be seen by Chronic Care Management for management of their diabetes?  No  Would the patient like to be referred to a Nutritionist or for Diabetic Management?  No   Diabetic Exams:  Diabetic Eye Exam: Completed 07/11/19 Diabetic Foot Exam: Overdue, Pt has been advised about the importance in completing this exam. Note made to follow up on this at next in office.  Interpreter Needed?: No  Information entered by :: MMarkoski, LPN   Activities of Daily Living In your present state of health, do you have any difficulty performing the following activities: 12/19/2019  Hearing? N  Vision? N  Difficulty concentrating or making decisions? N  Walking or climbing stairs? N  Dressing or bathing? N  Doing errands, shopping? N  Preparing Food and eating ? N  Using the Toilet? N  In the past six months, have you accidently leaked urine? N  Do you have problems with loss of bowel control? N  Managing your Medications? N  Managing your Finances? N  Housekeeping or managing your Housekeeping? N  Some recent data might be hidden    Patient Care Team: Birdie Sons, MD as PCP - General (Family Medicine) Murlean Iba, MD (Nephrology) Olean Ree, MD as Consulting Physician (General Surgery) Isaias Sakai, MD as Referring Physician  (Ophthalmology)  Indicate any recent Medical Services you may have received from other than [facility] providers in the past year (date may be approximate).     Assessment:   This is a routine wellness examination for Shere.  Hearing/Vision screen No exam data present  Dietary issues and exercise activities discussed: Current Exercise Habits: Home exercise routine, Type of exercise: treadmill (rides a stationary bike), Time (Minutes): 10, Frequency (Times/Week): 3, Weekly Exercise (Minutes/Week): 30, Intensity: Mild, Exercise limited by: None identified  Goals    . Exercise 3x per week (30 min per time)     Recommend increasing time walking on treadmill to 20-30 minutes at a time vs 10 minutes.       Depression Screen PHQ 2/9 Scores 12/19/2019 12/17/2018 11/23/2018 11/17/2017 11/16/2016 03/29/2016 01/12/2015  PHQ - 2 Score 0 0 0 0 0 0 0  PHQ- 9 Score - - 0 - - - -    Fall Risk Fall Risk  12/19/2019 12/25/2018 12/17/2018 11/23/2018 11/17/2017  Falls in the past year? 0 0 0 0 No  Number falls in past yr: 0 - - - -  Injury with Fall? 0 - - - -  Follow up - - - Falls evaluation completed -    Any stairs in or around the home? Yes  If so, are there any without handrails? No  Home free of loose throw rugs in walkways, pet beds, electrical cords, etc? Yes  Adequate lighting in your home to reduce risk of falls? Yes   ASSISTIVE DEVICES UTILIZED TO PREVENT FALLS:  Life alert? No  Use of a cane, walker or w/c? No  Grab bars in the bathroom? No  Shower chair or bench in shower? No  Elevated toilet seat or a handicapped toilet? Yes    Cognitive Function:     6CIT Screen 12/19/2019 12/17/2018 11/16/2016  What Year? 0 points 0 points 0 points  What month? 0 points 0 points 0 points  What time? 0 points 0 points 0 points  Count back from 20 0 points 0 points 0 points  Months in reverse 0 points 0 points 0 points  Repeat phrase 0 points 0 points 0 points  Total  Score 0 0 0     Immunizations Immunization History  Administered Date(s) Administered  . Fluad Quad(high Dose 65+) 01/02/2019  . Influenza, High Dose Seasonal PF 01/12/2015, 03/29/2016, 01/31/2017, 01/02/2018  . Pneumococcal Conjugate-13 10/07/2013  . Pneumococcal Polysaccharide-23 02/17/2004, 02/09/2011  . Td 09/01/2006  . Tdap 09/29/2016  . Zoster 04/09/2009    TDAP status: Up to date Flu Vaccine status: Up to date Pneumococcal vaccine status: Up to date Covid-19 vaccine status: Declined, Education has been provided regarding the importance of this vaccine but patient still declined. Advised may receive this vaccine at local pharmacy or Health Dept.or vaccine clinic. Aware to provide a copy of the vaccination record if obtained from local pharmacy or Health Dept. Verbalized acceptance and understanding.  Qualifies for Shingles Vaccine? Yes   Zostavax completed Yes   Shingrix Completed?: No.    Education has been provided regarding the importance of this vaccine. Patient has been advised to call insurance company to determine out of pocket expense if they have not yet received this vaccine. Advised may also receive vaccine at local pharmacy or Health Dept. Verbalized acceptance and understanding.  Screening Tests Health Maintenance  Topic Date Due  . FOOT EXAM  09/29/2017  . INFLUENZA VACCINE  11/17/2019  . COVID-19 Vaccine (1) 01/04/2020 (Originally 05/04/1952)  . HEMOGLOBIN A1C  03/11/2020  . OPHTHALMOLOGY EXAM  07/10/2020  . DEXA SCAN  12/01/2022  . TETANUS/TDAP  09/30/2026  . Hepatitis C Screening  Completed  . PNA vac Low Risk Adult  Completed    Health Maintenance  Health Maintenance Due  Topic Date Due  . FOOT EXAM  09/29/2017  . INFLUENZA VACCINE  11/17/2019    Colorectal cancer screening: No longer required.  Mammogram status: No longer required.  Bone Density status: Completed 11/30/17. Results reflect: Bone density results: OSTEOPENIA. Repeat every 5 years.  Lung Cancer  Screening: (Low Dose CT Chest recommended if Age 80-80 years, 30 pack-year currently smoking OR have quit w/in 15years.) does not qualify.    Additional Screening:  Vision Screening: Recommended annual ophthalmology exams for early detection of glaucoma and other disorders of the eye. Is the patient up to date with their annual eye exam?  Yes  Who is the provider or what is the name of the office in which the patient attends annual eye exams? Dr Roosevelt Locks @ [place] If pt is not established with a provider, would they like to be referred to a provider to establish care? No .   Dental Screening: Recommended annual dental exams for proper oral hygiene  Community Resource Referral / Chronic Care Management: CRR required this visit?  No   CCM required this visit?  No      Plan:     I have personally reviewed and noted the following in the patient's chart:   . Medical and social history . Use of alcohol, tobacco or illicit drugs  . Current medications and supplements . Functional ability and status . Nutritional status . Physical activity . Advanced directives . List of other physicians . Hospitalizations, surgeries, and ER visits in previous 12 months . Vitals . Screenings to include cognitive, depression, and falls . Referrals and appointments  In addition, I have reviewed and discussed with patient certain preventive protocols, quality metrics, and best practice recommendations. A written personalized care plan for preventive services as well as general preventive health recommendations were provided to patient.     Kemet Nijjar [place], [place]   [id number]   Nurse Notes: Pt  needs a diabetic foot exam and influenza vaccine at next in office apt.

## 2019-12-19 ENCOUNTER — Ambulatory Visit (INDEPENDENT_AMBULATORY_CARE_PROVIDER_SITE_OTHER): Payer: Medicare Other

## 2019-12-19 ENCOUNTER — Other Ambulatory Visit: Payer: Self-pay

## 2019-12-19 DIAGNOSIS — Z0000 Encounter for general adult medical examination without abnormal findings: Secondary | ICD-10-CM

## 2019-12-19 NOTE — Patient Instructions (Signed)
Rachel Lester , Thank you for taking time to come for your Medicare Wellness Visit. I appreciate your ongoing commitment to your health goals. Please review the following plan we discussed and let me know if I can assist you in the future.   Screening recommendations/referrals: Colonoscopy: No longer required.  Mammogram: No longer required.  Bone Density: Up to date, due 11/2022 Recommended yearly ophthalmology/optometry visit for glaucoma screening and checkup Recommended yearly dental visit for hygiene and checkup  Vaccinations: Influenza vaccine: Due fall 2021 Pneumococcal vaccine: Completed series Tdap vaccine: Up to date, due 09/2026 Shingles vaccine: Shingrix discussed. Please contact your pharmacy for coverage information.     Advanced directives: Advance directive discussed with you today. Even though you declined this today please call our office should you change your mind and we can give you the proper paperwork for you to fill out.  Conditions/risks identified: Recommend to increase exercise to 30 minutes 3 days a week.  Next appointment: 01/13/20 @ 1:20 PM with Dr Caryn Section    Preventive Care 65 Years and Older, Female Preventive care refers to lifestyle choices and visits with your health care provider that can promote health and wellness. What does preventive care include?  A yearly physical exam. This is also called an annual well check.  Dental exams once or twice a year.  Routine eye exams. Ask your health care provider how often you should have your eyes checked.  Personal lifestyle choices, including:  Daily care of your teeth and gums.  Regular physical activity.  Eating a healthy diet.  Avoiding tobacco and drug use.  Limiting alcohol use.  Practicing safe sex.  Taking low-dose aspirin every day.  Taking vitamin and mineral supplements as recommended by your health care provider. What happens during an annual well check? The services and screenings done  by your health care provider during your annual well check will depend on your age, overall health, lifestyle risk factors, and family history of disease. Counseling  Your health care provider may ask you questions about your:  Alcohol use.  Tobacco use.  Drug use.  Emotional well-being.  Home and relationship well-being.  Sexual activity.  Eating habits.  History of falls.  Memory and ability to understand (cognition).  Work and work Statistician.  Reproductive health. Screening  You may have the following tests or measurements:  Height, weight, and BMI.  Blood pressure.  Lipid and cholesterol levels. These may be checked every 5 years, or more frequently if you are over 70 years old.  Skin check.  Lung cancer screening. You may have this screening every year starting at age 59 if you have a 30-pack-year history of smoking and currently smoke or have quit within the past 15 years.  Fecal occult blood test (FOBT) of the stool. You may have this test every year starting at age 86.  Flexible sigmoidoscopy or colonoscopy. You may have a sigmoidoscopy every 5 years or a colonoscopy every 10 years starting at age 49.  Hepatitis C blood test.  Hepatitis B blood test.  Sexually transmitted disease (STD) testing.  Diabetes screening. This is done by checking your blood sugar (glucose) after you have not eaten for a while (fasting). You may have this done every 1-3 years.  Bone density scan. This is done to screen for osteoporosis. You may have this done starting at age 24.  Mammogram. This may be done every 1-2 years. Talk to your health care provider about how often you should have regular  mammograms. Talk with your health care provider about your test results, treatment options, and if necessary, the need for more tests. Vaccines  Your health care provider may recommend certain vaccines, such as:  Influenza vaccine. This is recommended every year.  Tetanus,  diphtheria, and acellular pertussis (Tdap, Td) vaccine. You may need a Td booster every 10 years.  Zoster vaccine. You may need this after age 27.  Pneumococcal 13-valent conjugate (PCV13) vaccine. One dose is recommended after age 30.  Pneumococcal polysaccharide (PPSV23) vaccine. One dose is recommended after age 103. Talk to your health care provider about which screenings and vaccines you need and how often you need them. This information is not intended to replace advice given to you by your health care provider. Make sure you discuss any questions you have with your health care provider. Document Released: 05/01/2015 Document Revised: 12/23/2015 Document Reviewed: 02/03/2015 Elsevier Interactive Patient Education  2017 [place] Prevention in the Home Falls can cause injuries. They can happen to people of all ages. There are many things you can do to make your home safe and to help prevent falls. What can I do on the outside of my home?  Regularly fix the edges of walkways and driveways and fix any cracks.  Remove anything that might make you trip as you walk through a door, such as a raised step or threshold.  Trim any bushes or trees on the path to your home.  Use bright outdoor lighting.  Clear any walking paths of anything that might make someone trip, such as rocks or tools.  Regularly check to see if handrails are loose or broken. Make sure that both sides of any steps have handrails.  Any raised decks and porches should have guardrails on the edges.  Have any leaves, snow, or ice cleared regularly.  Use sand or salt on walking paths during winter.  Clean up any spills in your garage right away. This includes oil or grease spills. What can I do in the bathroom?  Use night lights.  Install grab bars by the toilet and in the tub and shower. Do not use towel bars as grab bars.  Use non-skid mats or decals in the tub or shower.  If you need to sit down in  the shower, use a plastic, non-slip stool.  Keep the floor dry. Clean up any water that spills on the floor as soon as it happens.  Remove soap buildup in the tub or shower regularly.  Attach bath mats securely with double-sided non-slip rug tape.  Do not have throw rugs and other things on the floor that can make you trip. What can I do in the bedroom?  Use night lights.  Make sure that you have a light by your bed that is easy to reach.  Do not use any sheets or blankets that are too big for your bed. They should not hang down onto the floor.  Have a firm chair that has side arms. You can use this for support while you get dressed.  Do not have throw rugs and other things on the floor that can make you trip. What can I do in the kitchen?  Clean up any spills right away.  Avoid walking on wet floors.  Keep items that you use a lot in easy-to-reach places.  If you need to reach something above you, use a strong step stool that has a grab bar.  Keep electrical cords out of the way.  Do not use floor polish or wax that makes floors slippery. If you must use wax, use non-skid floor wax.  Do not have throw rugs and other things on the floor that can make you trip. What can I do with my stairs?  Do not leave any items on the stairs.  Make sure that there are handrails on both sides of the stairs and use them. Fix handrails that are broken or loose. Make sure that handrails are as long as the stairways.  Check any carpeting to make sure that it is firmly attached to the stairs. Fix any carpet that is loose or worn.  Avoid having throw rugs at the top or bottom of the stairs. If you do have throw rugs, attach them to the floor with carpet tape.  Make sure that you have a light switch at the top of the stairs and the bottom of the stairs. If you do not have them, ask someone to add them for you. What else can I do to help prevent falls?  Wear shoes that:  Do not have high  heels.  Have rubber bottoms.  Are comfortable and fit you well.  Are closed at the toe. Do not wear sandals.  If you use a stepladder:  Make sure that it is fully opened. Do not climb a closed stepladder.  Make sure that both sides of the stepladder are locked into place.  Ask someone to hold it for you, if possible.  Clearly mark and make sure that you can see:  Any grab bars or handrails.  First and last steps.  Where the edge of each step is.  Use tools that help you move around (mobility aids) if they are needed. These include:  Canes.  Walkers.  Scooters.  Crutches.  Turn on the lights when you go into a dark area. Replace any light bulbs as soon as they burn out.  Set up your furniture so you have a clear path. Avoid moving your furniture around.  If any of your floors are uneven, fix them.  If there are any pets around you, be aware of where they are.  Review your medicines with your doctor. Some medicines can make you feel dizzy. This can increase your chance of falling. Ask your doctor what other things that you can do to help prevent falls. This information is not intended to replace advice given to you by your health care provider. Make sure you discuss any questions you have with your health care provider. Document Released: 01/29/2009 Document Revised: 09/10/2015 Document Reviewed: 05/09/2014 Elsevier Interactive Patient Education  2017 [employer].

## 2019-12-24 ENCOUNTER — Other Ambulatory Visit: Payer: Self-pay | Admitting: Family Medicine

## 2019-12-24 DIAGNOSIS — E113311 Type 2 diabetes mellitus with moderate nonproliferative diabetic retinopathy with macular edema, right eye: Secondary | ICD-10-CM

## 2019-12-24 NOTE — Telephone Encounter (Signed)
Requested Prescriptions  Pending Prescriptions Disp Refills  . pioglitazone (ACTOS) 45 MG tablet [Pharmacy Med Name: PIOGLITAZONE  45MG   TAB] 90 tablet 0    Sig: TAKE 1 TABLET BY MOUTH  DAILY     Endocrinology:  Diabetes - Glitazones - pioglitazone Passed - 12/24/2019  9:11 PM      Passed - HBA1C is between 0 and 7.9 and within 180 days    Hemoglobin A1C  Date Value Ref Range Status  09/09/2019 7.6 (A) 4.0 - 5.6 % Final   Hgb A1c MFr Bld  Date Value Ref Range Status  11/23/2018 7.7 (H) 4.8 - 5.6 % Final    Comment:             Prediabetes: 5.7 - 6.4          Diabetes: >6.4          Glycemic control for adults with diabetes: <7.0          Passed - Valid encounter within last 6 months    Recent Outpatient Visits          3 months ago Type 2 diabetes mellitus with stage 3b chronic kidney disease, without long-term current use of insulin ([place])   [facility] Birdie Sons, MD   6 months ago Type 2 diabetes mellitus with stage 3b chronic kidney disease, without long-term current use of insulin ([place])   [facility] Birdie Sons, MD   9 months ago 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

## 2019-12-25 ENCOUNTER — Encounter: Payer: Self-pay | Admitting: Family Medicine

## 2019-12-25 DIAGNOSIS — E113311 Type 2 diabetes mellitus with moderate nonproliferative diabetic retinopathy with macular edema, right eye: Secondary | ICD-10-CM | POA: Diagnosis not present

## 2019-12-25 LAB — HM DIABETES EYE EXAM

## 2019-12-26 DIAGNOSIS — I10 Essential (primary) hypertension: Secondary | ICD-10-CM | POA: Diagnosis not present

## 2019-12-26 DIAGNOSIS — E7800 Pure hypercholesterolemia, unspecified: Secondary | ICD-10-CM | POA: Diagnosis not present

## 2019-12-26 DIAGNOSIS — I6381 Other cerebral infarction due to occlusion or stenosis of small artery: Secondary | ICD-10-CM | POA: Diagnosis not present

## 2019-12-26 DIAGNOSIS — I6523 Occlusion and stenosis of bilateral carotid arteries: Secondary | ICD-10-CM | POA: Diagnosis not present

## 2019-12-26 DIAGNOSIS — Z23 Encounter for immunization: Secondary | ICD-10-CM | POA: Diagnosis not present

## 2019-12-26 LAB — HM DIABETES EYE EXAM

## 2019-12-27 ENCOUNTER — Encounter: Payer: Self-pay | Admitting: Family Medicine

## 2020-01-02 DIAGNOSIS — D631 Anemia in chronic kidney disease: Secondary | ICD-10-CM | POA: Diagnosis not present

## 2020-01-02 DIAGNOSIS — E1129 Type 2 diabetes mellitus with other diabetic kidney complication: Secondary | ICD-10-CM | POA: Diagnosis not present

## 2020-01-02 DIAGNOSIS — R600 Localized edema: Secondary | ICD-10-CM | POA: Diagnosis not present

## 2020-01-02 DIAGNOSIS — N1832 Chronic kidney disease, stage 3b: Secondary | ICD-10-CM | POA: Diagnosis not present

## 2020-01-02 DIAGNOSIS — I10 Essential (primary) hypertension: Secondary | ICD-10-CM | POA: Diagnosis not present

## 2020-01-02 LAB — BASIC METABOLIC PANEL
BUN: 36 — AB (ref 4–21)
Creatinine: 1.5 — AB (ref 0.5–1.1)
Glucose: 157
Potassium: 4.7 (ref 3.4–5.3)
Sodium: 139 (ref 137–147)

## 2020-01-02 LAB — CBC AND DIFFERENTIAL
HCT: 30 — AB (ref 36–46)
Hemoglobin: 9.6 — AB (ref 12.0–16.0)
Platelets: 206 (ref 150–399)
WBC: 5

## 2020-01-02 LAB — MICROALBUMIN, URINE: Microalb, Ur: 21.4

## 2020-01-02 LAB — COMPREHENSIVE METABOLIC PANEL
Calcium: 9.1 (ref 8.7–10.7)
GFR calc non Af Amer: 33

## 2020-01-02 LAB — CBC: RBC: 3.11 — AB (ref 3.87–5.11)

## 2020-01-10 ENCOUNTER — Ambulatory Visit: Payer: Medicare Other | Admitting: Family Medicine

## 2020-01-13 ENCOUNTER — Encounter: Payer: Self-pay | Admitting: Family Medicine

## 2020-01-13 ENCOUNTER — Ambulatory Visit (INDEPENDENT_AMBULATORY_CARE_PROVIDER_SITE_OTHER): Payer: Medicare Other | Admitting: Family Medicine

## 2020-01-13 ENCOUNTER — Other Ambulatory Visit: Payer: Self-pay

## 2020-01-13 VITALS — BP 137/68 | HR 76 | Temp 98.3°F | Ht 67.0 in | Wt 160.6 lb

## 2020-01-13 DIAGNOSIS — I10 Essential (primary) hypertension: Secondary | ICD-10-CM | POA: Diagnosis not present

## 2020-01-13 DIAGNOSIS — N1832 Chronic kidney disease, stage 3b: Secondary | ICD-10-CM

## 2020-01-13 DIAGNOSIS — E1121 Type 2 diabetes mellitus with diabetic nephropathy: Secondary | ICD-10-CM | POA: Diagnosis not present

## 2020-01-13 DIAGNOSIS — Z23 Encounter for immunization: Secondary | ICD-10-CM | POA: Diagnosis not present

## 2020-01-13 DIAGNOSIS — D638 Anemia in other chronic diseases classified elsewhere: Secondary | ICD-10-CM | POA: Diagnosis not present

## 2020-01-13 DIAGNOSIS — D631 Anemia in chronic kidney disease: Secondary | ICD-10-CM | POA: Insufficient documentation

## 2020-01-13 DIAGNOSIS — E1122 Type 2 diabetes mellitus with diabetic chronic kidney disease: Secondary | ICD-10-CM

## 2020-01-13 LAB — POCT GLYCOSYLATED HEMOGLOBIN (HGB A1C)
Estimated Average Glucose: 148
Hemoglobin A1C: 6.8 % — AB (ref 4.0–5.6)

## 2020-01-13 NOTE — Progress Notes (Signed)
Established patient visit   Patient: Rachel Lester   DOB: 03/07/1941   79 y.o. Female  MRN: [id number] Visit Date: 01/13/2020  Today's healthcare provider: Lelon Huh, MD   Chief Complaint  Patient presents with  . Diabetes  . Hypertension   Subjective    HPI  Diabetes Mellitus Type II, Follow-up  Lab Results  Component Value Date   HGBA1C 7.6 (A) 09/09/2019   HGBA1C 9.8 (A) 06/25/2019   HGBA1C 7.7 (H) 11/23/2018   Wt Readings from Last 3 Encounters:  09/09/19 169 lb 9.6 oz (76.9 kg)  06/25/19 178 lb 6.4 oz (80.9 kg)  03/19/19 173 lb (78.5 kg)   Last seen for diabetes 4 months ago.  Management since then includes Much better since restarting low dose metformin. Continue current medications.  Recheck a1c in 4 months.  . She reports excellent compliance with treatment. She is not having side effects.    Home blood sugar records: fasting range: 141  Episodes of hypoglycemia? No    Current insulin regiment: none Most Recent Eye Exam: This month Current exercise: treadmill and stationary bike Current diet habits: well balanced  --------------------------------------------------------------------------------------------------- Hypertension, follow-up  BP Readings from Last 3 Encounters:  09/09/19 (!) 151/72  06/25/19 (!) 148/69  03/19/19 (!) 142/72   Wt Readings from Last 3 Encounters:  09/09/19 169 lb 9.6 oz (76.9 kg)  06/25/19 178 lb 6.4 oz (80.9 kg)  03/19/19 173 lb (78.5 kg)     She was last seen for hypertension 4 months ago.  BP at that visit was 151/72. Management since that visit includes increasing  amLODipine (NORVASC) to  5 MG  .  She reports excellent compliance with treatment. She is not having side effects.  She is following a Regular diet. She is exercising. She does not smoke.  Use of agents associated with hypertension: none.   Outside blood pressures are checked daily.  Pertinent labs: Lab Results  Component Value Date    CHOL 158 03/19/2019   HDL 53 03/19/2019   LDLCALC 86 03/19/2019   TRIG 105 03/19/2019   CHOLHDL 3.0 03/19/2019   Lab Results  Component Value Date   NA 139 01/02/2020   K 4.7 01/02/2020   CREATININE 1.5 (A) 01/02/2020   GFRNONAA 33 01/02/2020   GFRAA 41 (L) 03/19/2019   GLUCOSE 240 (H) 03/19/2019     The ASCVD Risk score (Goff DC Jr., et al., 2013) failed to calculate for the following reasons:   The patient has a prior MI or stroke diagnosis   --------------------------------------------------------------------------------------------------- Social History   Tobacco Use  . Smoking status: Former Smoker    Packs/day: 2.00    Years: 20.00    Pack years: 40.00    Types: Cigarettes    Quit date: 1980    Years since quitting: 41.7  . Smokeless tobacco: Never Used  . Tobacco comment: smoked about 2 packs per day, smoked for about 20 years; Quit in [place] Use  . Vaping Use: Never used  Substance Use Topics  . Alcohol use: Yes    Alcohol/week: 0.0 standard drinks    Comment: occasional- 1/month (wine)  . Drug use: No       Medications: Outpatient Medications Prior to Visit  Medication Sig  . ACCU-CHEK AVIVA PLUS test strip CHECK BLOOD SUGAR THREE  TIMES DAILY  . amLODipine (NORVASC) 5 MG tablet Take 1 tablet (5 mg total) by mouth daily. PLEASE NOTE CHANGE TO 5MG  TABLETS  .  b complex vitamins tablet Take 1 tablet by mouth daily.  . Biotin 7500 MCG TABS Take by mouth daily.   . Cholecalciferol (VITAMIN D3) 2000 UNITS capsule Take 2,000 Units by mouth daily.  . clopidogrel (PLAVIX) 75 MG tablet Take 1 tablet (75 mg total) by mouth daily.  . Coenzyme Q10 (COQ10 PO) Take 1 capsule by mouth daily. (Patient not taking: Reported on 12/19/2019)  . glimepiride (AMARYL) 4 MG tablet TAKE 1 TABLET BY MOUTH IN  THE MORNING  . Magnesium 500 MG TABS Take by mouth daily.  . metFORMIN (GLUCOPHAGE) 500 MG tablet TAKE 1 TABLET BY MOUTH  DAILY WITH SUPPER  . pioglitazone (ACTOS) 45 MG  tablet TAKE 1 TABLET BY MOUTH  DAILY  . rosuvastatin (CRESTOR) 20 MG tablet Take 1 tablet (20 mg total) by mouth daily.  Marland Kitchen torsemide (DEMADEX) 20 MG tablet Take 20 mg by mouth daily as needed. (Patient not taking: Reported on 12/19/2019)  . vitamin C (ASCORBIC ACID) 500 MG tablet Take 500 mg by mouth daily.  Marland Kitchen zinc gluconate 50 MG tablet Take 50 mg by mouth daily.   No facility-administered medications prior to visit.    Review of Systems   Objective    BP 137/68 (BP Location: Right Arm, Patient Position: Sitting, Cuff Size: Normal)   Pulse 76   Temp 98.3 F (36.8 C) (Oral)   Ht 5\' 7"  (1.702 m)   Wt 160 lb 9.6 oz (72.8 kg)   BMI 25.15 kg/m   Physical Exam  General appearance:  Well developed, well nourished female, cooperative and in no acute distress Head: Normocephalic, without obvious abnormality, atraumatic Respiratory: Respirations even and unlabored, normal respiratory rate Extremities: All extremities are intact.  Skin: Skin color, texture, turgor normal. No rashes seen  Psych: Appropriate mood and affect. Neurologic: Mental status: Alert, oriented to person, place, and time, thought content appropriate.   Results for orders placed or performed in visit on 01/13/20  POCT HgB A1C  Result Value Ref Range   Hemoglobin A1C 6.8 (A) 4.0 - 5.6 %   Estimated Average Glucose 148     Assessment & Plan     1. Type 2 diabetes mellitus with stage 3b chronic kidney disease, without long-term current use of insulin ([place]) a1c very well controlled. Continue current medications.    2. Chronic kidney disease, stage 3b (HCC) Stable, continue Q3-21mo follow up Dr. Candiss Norse  3. Essential hypertension Much better with increased dose of amlodipine.   4. Need for influenza vaccination  - Flu Vaccine QUAD High Dose(Fluad)  5. Anemia of chronic disease Stable, followed by Dr. Candiss Norse  Future Appointments  Date Time Provider [place]  05/15/2020  9:40 AM Caryn Section, Kirstie Peri, MD  [facility]  12/24/2020  1:20 PM BFP-NURSE HEALTH ADVISOR [facility]         The entirety of the information documented in the History of Present Illness, Review of Systems and Physical Exam were personally obtained by me. Portions of this information were initially documented by the CMA and reviewed by me for thoroughness and accuracy.      Lelon Huh, MD  [facility] [phone] (phone) [phone] (fax)  [place]

## 2020-01-13 NOTE — Patient Instructions (Signed)
.   Please review the attached list of medications and notify my office if there are any errors.   . Please bring all of your medications to every appointment so we can make sure that our medication list is the same as yours.   

## 2020-01-14 ENCOUNTER — Other Ambulatory Visit: Payer: Self-pay | Admitting: Family Medicine

## 2020-01-15 NOTE — Telephone Encounter (Signed)
Requested Prescriptions  Pending Prescriptions Disp Refills  . glimepiride (AMARYL) 4 MG tablet [Pharmacy Med Name: Glimepiride 4 MG Oral Tablet] 90 tablet 1    Sig: TAKE 1 TABLET BY MOUTH IN  THE MORNING     Endocrinology:  Diabetes - Sulfonylureas Passed - 01/14/2020  9:13 PM      Passed - HBA1C is between 0 and 7.9 and within 180 days    Hemoglobin A1C  Date Value Ref Range Status  01/13/2020 6.8 (A) 4.0 - 5.6 % Final   Hgb A1c MFr Bld  Date Value Ref Range Status  11/23/2018 7.7 (H) 4.8 - 5.6 % Final    Comment:             Prediabetes: 5.7 - 6.4          Diabetes: >6.4          Glycemic control for adults with diabetes: <7.0          Passed - Valid encounter within last 6 months    Recent Outpatient Visits          2 days ago Type 2 diabetes mellitus with stage 3b chronic kidney disease, without long-term current use of insulin ([place])   [facility] Birdie Sons, MD   4 months ago Type 2 diabetes mellitus with stage 3b chronic kidney disease, without long-term current use of insulin ([place])   [facility] Birdie Sons, MD   6 months ago Type 2 diabetes mellitus with stage 3b chronic kidney disease, without long-term current use of insulin ([place])   [facility] Birdie Sons, MD   10 months ago Cerebrovascular accident (CVA) due to occlusion of small artery [facility])   [place], Donald E, MD   1 year ago Need for influenza vaccination   [facility] Birdie Sons, MD      Future Appointments            In 4 months Fisher, Kirstie Peri, MD [facility], [place]

## 2020-02-13 ENCOUNTER — Other Ambulatory Visit: Payer: Self-pay | Admitting: Family Medicine

## 2020-02-18 ENCOUNTER — Other Ambulatory Visit: Payer: Self-pay | Admitting: Family Medicine

## 2020-02-18 DIAGNOSIS — N1832 Chronic kidney disease, stage 3b: Secondary | ICD-10-CM

## 2020-02-18 DIAGNOSIS — E1122 Type 2 diabetes mellitus with diabetic chronic kidney disease: Secondary | ICD-10-CM

## 2020-02-18 NOTE — Telephone Encounter (Signed)
Requested Prescriptions  Pending Prescriptions Disp Refills  . metFORMIN (GLUCOPHAGE) 500 MG tablet [Pharmacy Med Name: metFORMIN HCl 500 MG Oral Tablet] 90 tablet 1    Sig: TAKE 1 TABLET BY MOUTH  DAILY WITH SUPPER     Endocrinology:  Diabetes - Biguanides Failed - 02/18/2020  9:15 PM      Failed - Cr in normal range and within 360 days    Creatinine  Date Value Ref Range Status  01/02/2020 1.5 (A) 0.5 - 1.1 Final  05/12/2013 1.23 0.60 - 1.30 mg/dL Final   Creatinine, Ser  Date Value Ref Range Status  03/19/2019 1.43 (H) 0.57 - 1.00 mg/dL Final   Creatinine, POC  Date Value Ref Range Status  01/31/2017 n/a mg/dL Final         Passed - HBA1C is between 0 and 7.9 and within 180 days    Hemoglobin A1C  Date Value Ref Range Status  01/13/2020 6.8 (A) 4.0 - 5.6 % Final   Hgb A1c MFr Bld  Date Value Ref Range Status  11/23/2018 7.7 (H) 4.8 - 5.6 % Final    Comment:             Prediabetes: 5.7 - 6.4          Diabetes: >6.4          Glycemic control for adults with diabetes: <7.0          Passed - eGFR in normal range and within 360 days    EGFR (African American)  Date Value Ref Range Status  05/12/2013 51 (L)  Final   GFR calc Af Amer  Date Value Ref Range Status  03/19/2019 41 (L) >59 mL/min/1.73 Final   EGFR (Non-African Amer.)  Date Value Ref Range Status  05/12/2013 44 (L)  Final    Comment:    eGFR values <107mL/min/1.73 m2 may be an indication of chronic kidney disease (CKD). Calculated eGFR is useful in patients with stable renal function. The eGFR calculation will not be reliable in acutely ill patients when serum creatinine is changing rapidly. It is not useful in  patients on dialysis. The eGFR calculation may not be applicable to patients at the low and high extremes of body sizes, pregnant women, and vegetarians.    GFR calc non Af Amer  Date Value Ref Range Status  01/02/2020 33  Final         Passed - Valid encounter within last 6 months     Recent Outpatient Visits          1 month ago Type 2 diabetes mellitus with stage 3b chronic kidney disease, without long-term current use of insulin ([place])   [facility] Birdie Sons, MD   5 months ago Type 2 diabetes mellitus with stage 3b chronic kidney disease, without long-term current use of insulin ([place])   [facility] Birdie Sons, MD   7 months ago Type 2 diabetes mellitus with stage 3b chronic kidney disease, without long-term current use of insulin [facility])   [facility] Birdie Sons, MD   11 months ago Cerebrovascular accident (CVA) due to occlusion of small artery [facility])   [facility] Birdie Sons, MD   1 year ago Need for influenza vaccination   [facility] Birdie Sons, MD      Future Appointments            In 2 months Fisher, Kirstie Peri, MD [facility],  [facility]           Patient was seen by Dr. Caryn Section on 01/13/2020.

## 2020-03-18 ENCOUNTER — Other Ambulatory Visit: Payer: Self-pay | Admitting: Family Medicine

## 2020-03-18 DIAGNOSIS — E113311 Type 2 diabetes mellitus with moderate nonproliferative diabetic retinopathy with macular edema, right eye: Secondary | ICD-10-CM

## 2020-05-15 ENCOUNTER — Encounter: Payer: Self-pay | Admitting: Family Medicine

## 2020-05-15 ENCOUNTER — Ambulatory Visit (INDEPENDENT_AMBULATORY_CARE_PROVIDER_SITE_OTHER): Payer: Medicare Other | Admitting: Family Medicine

## 2020-05-15 ENCOUNTER — Other Ambulatory Visit: Payer: Self-pay

## 2020-05-15 VITALS — BP 147/68 | HR 76 | Temp 97.4°F | Resp 18 | Wt 162.2 lb

## 2020-05-15 DIAGNOSIS — N1832 Chronic kidney disease, stage 3b: Secondary | ICD-10-CM

## 2020-05-15 DIAGNOSIS — I10 Essential (primary) hypertension: Secondary | ICD-10-CM | POA: Diagnosis not present

## 2020-05-15 DIAGNOSIS — E1122 Type 2 diabetes mellitus with diabetic chronic kidney disease: Secondary | ICD-10-CM

## 2020-05-15 LAB — POCT GLYCOSYLATED HEMOGLOBIN (HGB A1C)
Est. average glucose Bld gHb Est-mCnc: 146
Hemoglobin A1C: 6.7 % — AB (ref 4.0–5.6)

## 2020-05-15 NOTE — Progress Notes (Signed)
Established patient visit   Patient: Rachel Lester   DOB: July 01, 1940   80 y.o. Female  MRN: [id number] Visit Date: 05/15/2020  Today's healthcare provider: Lelon Huh, MD   Chief Complaint  Patient presents with  . Diabetes  . Hypertension  . Hyperlipidemia   Subjective    HPI  Diabetes Mellitus Type II, Follow-up  Lab Results  Component Value Date   HGBA1C 6.8 (A) 01/13/2020   HGBA1C 7.6 (A) 09/09/2019   HGBA1C 9.8 (A) 06/25/2019   Wt Readings from Last 3 Encounters:  05/15/20 162 lb 3.2 oz (73.6 kg)  01/13/20 160 lb 9.6 oz (72.8 kg)  09/09/19 169 lb 9.6 oz (76.9 kg)   Last seen for diabetes 4 months ago.  Management since then includes continue same medications. She reports good compliance with treatment. She is not having side effects.  Symptoms: No fatigue No foot ulcerations  No appetite changes No nausea  No paresthesia of the feet  No polydipsia  No polyuria No visual disturbances   No vomiting     Home blood sugar records: fasting range: 140-150  Episodes of hypoglycemia? No    Current insulin regiment: none Most Recent Eye Exam: 12/26/2019 Current exercise: walking Current diet habits: well balanced  Pertinent Labs: Lab Results  Component Value Date   CHOL 158 03/19/2019   HDL 53 03/19/2019   LDLCALC 86 03/19/2019   TRIG 105 03/19/2019   CHOLHDL 3.0 03/19/2019   Lab Results  Component Value Date   NA 139 01/02/2020   K 4.7 01/02/2020   CREATININE 1.5 (A) 01/02/2020   GFRNONAA 33 01/02/2020   GFRAA 41 (L) 03/19/2019   GLUCOSE 240 (H) 03/19/2019     ---------------------------------------------------------------------------------------------------  Hypertension, follow-up  BP Readings from Last 3 Encounters:  05/15/20 (!) 147/68  01/13/20 137/68  09/09/19 (!) 151/72   Wt Readings from Last 3 Encounters:  05/15/20 162 lb 3.2 oz (73.6 kg)  01/13/20 160 lb 9.6 oz (72.8 kg)  09/09/19 169 lb 9.6 oz (76.9 kg)     She was  last seen for hypertension 4 months ago.  BP at that visit was 137/68. Management since that visit includes continue same medication.  She reports good compliance with treatment. She is not having side effects.  She is following a Regular diet. She is exercising. She does not smoke.  Use of agents associated with hypertension: none.   Outside blood pressures are checked occasionally and she states home blood pressures are consistently in the 120s.  Symptoms: No chest pain No chest pressure  No palpitations No syncope  No dyspnea No orthopnea  No paroxysmal nocturnal dyspnea No lower extremity edema    The ASCVD Risk score ([place]., et al., 2013) failed to calculate for the following reasons:   The patient has a prior MI or stroke diagnosis   --------------------------------------------------------------------------------------------------- Lipid/Cholesterol, Follow-up  Last lipid panel Other pertinent labs  Lab Results  Component Value Date   CHOL 158 03/19/2019   HDL 53 03/19/2019   LDLCALC 86 03/19/2019   TRIG 105 03/19/2019   CHOLHDL 3.0 03/19/2019   Lab Results  Component Value Date   ALT 20 03/19/2019   AST 21 03/19/2019   PLT 206 01/02/2020     She was last seen for this 1 year ago.  Management since that visit includes continue same medication.  She reports good compliance with treatment. She is not having side effects.   Symptoms: No chest  pain No chest pressure/discomfort  No dyspnea No lower extremity edema  No numbness or tingling of extremity No orthopnea  No palpitations No paroxysmal nocturnal dyspnea  No speech difficulty No syncope   Current diet: well balanced Current exercise: walking  The ASCVD Risk score Mikey Bussing DC Jr., et al., 2013) failed to calculate for the following reasons:   The patient has a prior MI or stroke diagnosis  ---------------------------------------------------------------------------------------------------  Follow  up for CKD:  The patient was last seen for this 4 months ago. Changes made at last visit include none. This problem is managed by Nephrology.   She reports good compliance with treatment. She feels that condition is Unchanged. She is not having side effects.   -----------------------------------------------------------------------------------------      Medications: Outpatient Medications Prior to Visit  Medication Sig  . ACCU-CHEK AVIVA PLUS test strip CHECK BLOOD SUGAR THREE  TIMES DAILY  . amLODipine (NORVASC) 5 MG tablet Take 1 tablet (5 mg total) by mouth daily. PLEASE NOTE CHANGE TO 5MG  TABLETS  . b complex vitamins tablet Take 1 tablet by mouth daily.  . Biotin 7500 MCG TABS Take by mouth daily.   . Cholecalciferol (VITAMIN D3) 2000 UNITS capsule Take 2,000 Units by mouth daily.  . clopidogrel (PLAVIX) 75 MG tablet TAKE 1 TABLET BY MOUTH  DAILY  . Coenzyme Q10 (COQ10 PO) Take 1 capsule by mouth daily.  Marland Kitchen glimepiride (AMARYL) 4 MG tablet TAKE 1 TABLET BY MOUTH IN  THE MORNING  . Magnesium 500 MG TABS Take by mouth daily.  . metFORMIN (GLUCOPHAGE) 500 MG tablet TAKE 1 TABLET BY MOUTH  DAILY WITH SUPPER  . pioglitazone (ACTOS) 45 MG tablet TAKE 1 TABLET BY MOUTH  DAILY  . rosuvastatin (CRESTOR) 20 MG tablet TAKE 1 TABLET BY MOUTH  DAILY  . torsemide (DEMADEX) 20 MG tablet Take 20 mg by mouth daily as needed.   . vitamin C (ASCORBIC ACID) 500 MG tablet Take 500 mg by mouth daily.  Marland Kitchen zinc gluconate 50 MG tablet Take 50 mg by mouth daily.   No facility-administered medications prior to visit.    Review of Systems  Constitutional: Negative for appetite change, chills, fatigue and fever.  Respiratory: Negative for chest tightness and shortness of breath.   Cardiovascular: Negative for chest pain and palpitations.  Gastrointestinal: Negative for abdominal pain, nausea and vomiting.  Neurological: Negative for dizziness and weakness.       Objective    BP (!) 147/68 (BP  Location: Right Arm, Patient Position: Sitting, Cuff Size: Normal)   Pulse 76   Temp (!) 97.4 F (36.3 C) (Temporal)   Resp 18   Wt 162 lb 3.2 oz (73.6 kg)   BMI 25.40 kg/m     Physical Exam  General appearance:  Well developed, well nourished female, cooperative and in no acute distress Head: Normocephalic, without obvious abnormality, atraumatic Respiratory: Respirations even and unlabored, normal respiratory rate Extremities: All extremities are intact.  Skin: Skin color, texture, turgor normal. No rashes seen  Psych: Appropriate mood and affect. Neurologic: Mental status: Alert, oriented to person, place, and time, thought content appropriate.   Results for orders placed or performed in visit on 05/15/20  Microalbumin, urine  Result Value Ref Range   Microalb, Ur 21.4   Results for orders placed or performed in visit on 05/15/20  POCT HgB A1C  Result Value Ref Range   Hemoglobin A1C 6.7 (A) 4.0 - 5.6 %   Est. average glucose Bld gHb Est-mCnc  146     Assessment & Plan     1. Type 2 diabetes mellitus with stage 3b chronic kidney disease, without long-term current use of insulin ([place]) Very well controlled. Continue regularly follow up with nephrology. Continue current medications.    2. Primary hypertension She reports she checks home BP with upper arm cough and consistently in the 120s. Continue current medications.    Future Appointments  Date Time Provider [place]  11/13/2020  9:40 AM Birdie Sons, MD [facility]  12/24/2020  1:20 PM BFP-NURSE HEALTH ADVISOR [facility]    She states she had Covid in September 2021  Recommended Covid vaccine which she refused.     The entirety of the information documented in the History of Present Illness, Review of Systems and Physical Exam were personally obtained by me. Portions of this information were initially documented by the CMA and reviewed by me for thoroughness and accuracy.      Lelon Huh, MD   [facility] [phone] (phone) [phone] (fax)  [place]

## 2020-06-09 ENCOUNTER — Other Ambulatory Visit: Payer: Self-pay | Admitting: Family Medicine

## 2020-06-09 DIAGNOSIS — E113311 Type 2 diabetes mellitus with moderate nonproliferative diabetic retinopathy with macular edema, right eye: Secondary | ICD-10-CM

## 2020-06-22 DIAGNOSIS — E113293 Type 2 diabetes mellitus with mild nonproliferative diabetic retinopathy without macular edema, bilateral: Secondary | ICD-10-CM | POA: Diagnosis not present

## 2020-06-30 ENCOUNTER — Other Ambulatory Visit: Payer: Self-pay | Admitting: Family Medicine

## 2020-06-30 NOTE — Telephone Encounter (Signed)
Requested Prescriptions  Pending Prescriptions Disp Refills  . glimepiride (AMARYL) 4 MG tablet [Pharmacy Med Name: Glimepiride 4 MG Oral Tablet] 90 tablet 3    Sig: TAKE 1 TABLET BY MOUTH IN  THE MORNING     Endocrinology:  Diabetes - Sulfonylureas Passed - 06/30/2020  9:16 PM      Passed - HBA1C is between 0 and 7.9 and within 180 days    Hemoglobin A1C  Date Value Ref Range Status  05/15/2020 6.7 (A) 4.0 - 5.6 % Final   Hgb A1c MFr Bld  Date Value Ref Range Status  11/23/2018 7.7 (H) 4.8 - 5.6 % Final    Comment:             Prediabetes: 5.7 - 6.4          Diabetes: >6.4          Glycemic control for adults with diabetes: <7.0          Passed - Valid encounter within last 6 months    Recent Outpatient Visits          1 month ago Type 2 diabetes mellitus with stage 3b chronic kidney disease, without long-term current use of insulin ([place])   [facility] Birdie Sons, MD   5 months ago Type 2 diabetes mellitus with stage 3b chronic kidney disease, without long-term current use of insulin ([place])   [facility] Birdie Sons, MD   9 months ago Type 2 diabetes mellitus with stage 3b chronic kidney disease, without long-term current use of insulin ([place])   [facility] Birdie Sons, MD   1 year ago Type 2 diabetes mellitus with stage 3b chronic kidney disease, without long-term current use of insulin ([place])   [facility] Birdie Sons, MD   1 year ago Cerebrovascular accident (CVA) due to occlusion of small artery [facility])   [place], Kirstie Peri, MD      Future Appointments            In 4 months Fisher, Kirstie Peri, MD [facility], [place]

## 2020-07-02 DIAGNOSIS — E7800 Pure hypercholesterolemia, unspecified: Secondary | ICD-10-CM | POA: Diagnosis not present

## 2020-07-02 DIAGNOSIS — I10 Essential (primary) hypertension: Secondary | ICD-10-CM | POA: Diagnosis not present

## 2020-07-02 DIAGNOSIS — N1832 Chronic kidney disease, stage 3b: Secondary | ICD-10-CM | POA: Diagnosis not present

## 2020-07-02 DIAGNOSIS — I6523 Occlusion and stenosis of bilateral carotid arteries: Secondary | ICD-10-CM | POA: Diagnosis not present

## 2020-07-07 DIAGNOSIS — N1832 Chronic kidney disease, stage 3b: Secondary | ICD-10-CM | POA: Diagnosis not present

## 2020-07-07 DIAGNOSIS — D631 Anemia in chronic kidney disease: Secondary | ICD-10-CM | POA: Diagnosis not present

## 2020-07-07 DIAGNOSIS — E1129 Type 2 diabetes mellitus with other diabetic kidney complication: Secondary | ICD-10-CM | POA: Diagnosis not present

## 2020-07-07 DIAGNOSIS — R600 Localized edema: Secondary | ICD-10-CM | POA: Diagnosis not present

## 2020-07-07 DIAGNOSIS — I10 Essential (primary) hypertension: Secondary | ICD-10-CM | POA: Diagnosis not present

## 2020-07-15 ENCOUNTER — Telehealth: Payer: Self-pay

## 2020-07-15 DIAGNOSIS — E1122 Type 2 diabetes mellitus with diabetic chronic kidney disease: Secondary | ICD-10-CM

## 2020-07-15 DIAGNOSIS — N1832 Chronic kidney disease, stage 3b: Secondary | ICD-10-CM

## 2020-07-15 MED ORDER — ACCU-CHEK AVIVA PLUS W/DEVICE KIT: PACK | 0 refills | Status: DC

## 2020-07-15 NOTE — Telephone Encounter (Signed)
Sent in glucometer

## 2020-07-15 NOTE — Addendum Note (Signed)
Addended by: Wilburt Finlay on: 07/15/2020 01:56 PM   Modules accepted: Orders

## 2020-07-15 NOTE — Telephone Encounter (Signed)
Copied from [place] [id number]. Topic: General - Inquiry >> Jul 14, 2020  3:00 PM Greggory Keen D wrote: Reason for CRM: pt called saying her glucose meter went out last week and she needs a new meter.  "Aviva Accu-Chek meter."  She called [facility] and they told her to call us for the prescription.  They said Dr. Caryn Section needs to send them the prescription...order #[id number].

## 2020-07-21 DIAGNOSIS — N1832 Chronic kidney disease, stage 3b: Secondary | ICD-10-CM | POA: Diagnosis not present

## 2020-07-21 NOTE — Telephone Encounter (Addendum)
Relation to pt: self  Call back number: [phone]  Pharmacy:  [facility], [address], [address] Phone:  [phone]  Fax:  [phone]      Reason for call:   Blood Glucose Monitoring Suppl (ACCU-CHEK AVIVA PLUS) w/Device KIT / Test strips. Mail order advised AVIVA is not in stock. Patient states mail order continues to deny receiving rx and would like nurse to call and give a verbal. Patient requesting a follow up call please leave a detail message regarding status,

## 2020-07-22 NOTE — Telephone Encounter (Signed)
Pt is calling back again as she cannot test her Blood Sugar. She needs this meter asap. She states was told she would be called yesterday and was not. Please send or verify. States would be ok if send to local if not to much more expensive. Please know that she is anxious as can not check her readings.  Script Was verified today that was sent on 31st and not received by [facility]. RX

## 2020-07-23 NOTE — Telephone Encounter (Signed)
Called [facility] to give verbal order on glucometer and test strips. She will be receiving AccuChek Guide testing kit and supplies. They will send immediately as I have notified that patient was out.

## 2020-07-29 ENCOUNTER — Other Ambulatory Visit: Payer: Self-pay | Admitting: Family Medicine

## 2020-07-29 NOTE — Telephone Encounter (Signed)
Requested Prescriptions  Pending Prescriptions Disp Refills  . clopidogrel (PLAVIX) 75 MG tablet [Pharmacy Med Name: Clopidogrel Bisulfate 75 MG Oral Tablet] 90 tablet 1    Sig: TAKE 1 TABLET BY MOUTH  DAILY     Hematology: Antiplatelets - clopidogrel Failed - 07/29/2020  9:14 PM      Failed - Evaluate AST, ALT within 2 months of therapy initiation.      Failed - ALT in normal range and within 360 days    ALT  Date Value Ref Range Status  03/19/2019 20 0 - 32 IU/L Final         Failed - AST in normal range and within 360 days    AST  Date Value Ref Range Status  03/19/2019 21 0 - 40 IU/L Final         Failed - HCT in normal range and within 180 days    HCT  Date Value Ref Range Status  01/02/2020 30 (A) 36 - 46 Final   Hematocrit  Date Value Ref Range Status  03/19/2019 31.1 (L) 34.0 - 46.6 % Final         Failed - HGB in normal range and within 180 days    Hemoglobin  Date Value Ref Range Status  01/02/2020 9.6 (A) 12.0 - 16.0 Final  03/19/2019 10.1 (L) 11.1 - 15.9 g/dL Final         Failed - PLT in normal range and within 180 days    Platelets  Date Value Ref Range Status  01/02/2020 206 150 - 399 Final  03/19/2019 237 150 - 450 x10E3/uL Final         Passed - Valid encounter within last 6 months    Recent Outpatient Visits          2 months ago Type 2 diabetes mellitus with stage 3b chronic kidney disease, without long-term current use of insulin ([place])   [facility] Birdie Sons, MD   6 months ago Type 2 diabetes mellitus with stage 3b chronic kidney disease, without long-term current use of insulin ([place])   [facility] Birdie Sons, MD   10 months ago Type 2 diabetes mellitus with stage 3b chronic kidney disease, without long-term current use of insulin ([place])   [facility] Birdie Sons, MD   1 year ago Type 2 diabetes mellitus with stage 3b chronic kidney disease, without long-term current use of insulin  ([place])   [facility] Birdie Sons, MD   1 year ago Cerebrovascular accident (CVA) due to occlusion of small artery [facility])   [place], Kirstie Peri, MD      Future Appointments            In 3 months Fisher, Kirstie Peri, MD [facility], [place]

## 2020-08-06 ENCOUNTER — Other Ambulatory Visit: Payer: Self-pay | Admitting: Family Medicine

## 2020-08-06 DIAGNOSIS — E1122 Type 2 diabetes mellitus with diabetic chronic kidney disease: Secondary | ICD-10-CM

## 2020-08-26 DIAGNOSIS — E1129 Type 2 diabetes mellitus with other diabetic kidney complication: Secondary | ICD-10-CM | POA: Diagnosis not present

## 2020-08-26 DIAGNOSIS — R600 Localized edema: Secondary | ICD-10-CM | POA: Diagnosis not present

## 2020-08-26 DIAGNOSIS — N1832 Chronic kidney disease, stage 3b: Secondary | ICD-10-CM | POA: Diagnosis not present

## 2020-08-26 DIAGNOSIS — I10 Essential (primary) hypertension: Secondary | ICD-10-CM | POA: Diagnosis not present

## 2020-08-26 DIAGNOSIS — D631 Anemia in chronic kidney disease: Secondary | ICD-10-CM | POA: Diagnosis not present

## 2020-10-27 ENCOUNTER — Other Ambulatory Visit: Payer: Self-pay | Admitting: Family Medicine

## 2020-10-27 DIAGNOSIS — N1832 Chronic kidney disease, stage 3b: Secondary | ICD-10-CM

## 2020-11-03 DIAGNOSIS — E1129 Type 2 diabetes mellitus with other diabetic kidney complication: Secondary | ICD-10-CM | POA: Diagnosis not present

## 2020-11-03 DIAGNOSIS — I10 Essential (primary) hypertension: Secondary | ICD-10-CM | POA: Diagnosis not present

## 2020-11-03 DIAGNOSIS — R82998 Other abnormal findings in urine: Secondary | ICD-10-CM | POA: Diagnosis not present

## 2020-11-03 DIAGNOSIS — N1832 Chronic kidney disease, stage 3b: Secondary | ICD-10-CM | POA: Diagnosis not present

## 2020-11-10 DIAGNOSIS — D631 Anemia in chronic kidney disease: Secondary | ICD-10-CM | POA: Diagnosis not present

## 2020-11-10 DIAGNOSIS — R600 Localized edema: Secondary | ICD-10-CM | POA: Diagnosis not present

## 2020-11-10 DIAGNOSIS — I10 Essential (primary) hypertension: Secondary | ICD-10-CM | POA: Diagnosis not present

## 2020-11-10 DIAGNOSIS — N184 Chronic kidney disease, stage 4 (severe): Secondary | ICD-10-CM | POA: Diagnosis not present

## 2020-11-10 DIAGNOSIS — E1129 Type 2 diabetes mellitus with other diabetic kidney complication: Secondary | ICD-10-CM | POA: Diagnosis not present

## 2020-11-13 ENCOUNTER — Other Ambulatory Visit: Payer: Self-pay

## 2020-11-13 ENCOUNTER — Encounter: Payer: Self-pay | Admitting: Family Medicine

## 2020-11-13 ENCOUNTER — Ambulatory Visit (INDEPENDENT_AMBULATORY_CARE_PROVIDER_SITE_OTHER): Payer: Medicare Other | Admitting: Family Medicine

## 2020-11-13 VITALS — BP 111/62 | HR 86 | Temp 97.5°F | Resp 16 | Ht 67.0 in | Wt 153.0 lb

## 2020-11-13 DIAGNOSIS — E1122 Type 2 diabetes mellitus with diabetic chronic kidney disease: Secondary | ICD-10-CM

## 2020-11-13 DIAGNOSIS — N1832 Chronic kidney disease, stage 3b: Secondary | ICD-10-CM | POA: Diagnosis not present

## 2020-11-13 DIAGNOSIS — I10 Essential (primary) hypertension: Secondary | ICD-10-CM

## 2020-11-13 LAB — POCT GLYCOSYLATED HEMOGLOBIN (HGB A1C)
Est. average glucose Bld gHb Est-mCnc: 140
Hemoglobin A1C: 6.5 % — AB (ref 4.0–5.6)

## 2020-11-13 NOTE — Progress Notes (Signed)
Established patient visit   Patient: Rachel Lester   DOB: 01/19/1941   80 y.o. Female  MRN: [id number] Visit Date: 11/13/2020  Today's healthcare provider: Lelon Huh, MD   Chief Complaint  Patient presents with   Diabetes   Hypertension   Hyperlipidemia    Subjective    HPI  Diabetes Mellitus Type II, follow-up  Lab Results  Component Value Date   HGBA1C 6.5 (A) 11/13/2020   HGBA1C 6.7 (A) 05/15/2020   HGBA1C 6.8 (A) 01/13/2020   Last seen for diabetes 6 months ago.  Management since then includes; Very well controlled. Continue regularly follow up with nephrology. Continue current medications.   She reports good compliance with treatment. She is not having side effects.   Home blood sugar records: fasting range: 150s  Episodes of hypoglycemia? No    Current insulin regiment: none Most Recent Eye Exam: 12/19/2019  --------------------------------------------------------------------------------------------------- Hypertension, follow-up  BP Readings from Last 3 Encounters:  11/13/20 111/62  05/15/20 (!) 147/68  01/13/20 137/68   Wt Readings from Last 3 Encounters:  11/13/20 153 lb (69.4 kg)  05/15/20 162 lb 3.2 oz (73.6 kg)  01/13/20 160 lb 9.6 oz (72.8 kg)     She was last seen for hypertension 6 months ago.  BP at that visit was 147/68. Management since that visit includes; Continue current medications.   She reports good compliance with treatment. She is not having side effects.  She is not exercising. She is adherent to low salt diet.   Outside blood pressures are checked occasionally.  She does not smoke.  Use of agents associated with hypertension: none.   --------------------------------------------------------------------------------------------------- Lipid/Cholesterol, follow-up  Last Lipid Panel: Lab Results  Component Value Date   CHOL 158 03/19/2019   LDLCALC 86 03/19/2019   HDL 53 03/19/2019   TRIG 105 03/19/2019     She was last seen for this 03/19/2019.  Management since that visit includes; on rosuvastatin.  She reports good compliance with treatment. She is not having side effects.   She is following a Regular diet. Current exercise: no regular exercise  Last metabolic panel Lab Results  Component Value Date   GLUCOSE 240 (H) 03/19/2019   NA 139 01/02/2020   K 4.7 01/02/2020   BUN 36 (A) 01/02/2020   CREATININE 1.5 (A) 01/02/2020   GFRNONAA 33 01/02/2020   GFRAA 41 (L) 03/19/2019   CALCIUM 9.1 01/02/2020   AST 21 03/19/2019   ALT 20 03/19/2019   The ASCVD Risk score (Goff DC Jr., et al., 2013) failed to calculate for the following reasons:   The patient has a prior MI or stroke diagnosis     Medications: Outpatient Medications Prior to Visit  Medication Sig   ACCU-CHEK AVIVA PLUS test strip CHECK BLOOD SUGAR THREE  TIMES DAILY   amLODipine (NORVASC) 5 MG tablet Take 1 tablet (5 mg total) by mouth daily. PLEASE NOTE CHANGE TO $RemoveB'5MG'YqNhpZSg$  TABLETS   b complex vitamins tablet Take 1 tablet by mouth daily.   Biotin 7500 MCG TABS Take by mouth daily.    Blood Glucose Monitoring Suppl (ACCU-CHEK AVIVA PLUS) w/Device KIT Please supply glucometer.   clopidogrel (PLAVIX) 75 MG tablet TAKE 1 TABLET BY MOUTH  DAILY   Coenzyme Q10 (COQ10 PO) Take 1 capsule by mouth daily.   glimepiride (AMARYL) 4 MG tablet TAKE 1 TABLET BY MOUTH IN  THE MORNING   losartan (COZAAR) 25 MG tablet Take 1 tablet by mouth daily.  Magnesium 500 MG TABS Take by mouth daily.   metFORMIN (GLUCOPHAGE) 500 MG tablet Take 1 tablet (500 mg total) by mouth daily with supper.   pioglitazone (ACTOS) 45 MG tablet TAKE 1 TABLET BY MOUTH  DAILY   rosuvastatin (CRESTOR) 20 MG tablet TAKE 1 TABLET BY MOUTH  DAILY   torsemide (DEMADEX) 20 MG tablet Take 20 mg by mouth daily as needed.    vitamin C (ASCORBIC ACID) 500 MG tablet Take 500 mg by mouth daily.   zinc gluconate 50 MG tablet Take 50 mg by mouth daily.   Cholecalciferol  (VITAMIN D3) 2000 UNITS capsule Take 2,000 Units by mouth daily.   No facility-administered medications prior to visit.    Review of Systems  Constitutional:  Negative for appetite change, chills, fatigue and fever.  Respiratory:  Negative for chest tightness and shortness of breath.   Cardiovascular:  Negative for chest pain and palpitations.  Gastrointestinal:  Negative for abdominal pain, nausea and vomiting.  Neurological:  Negative for dizziness and weakness.      Objective    BP 111/62   Pulse 86   Temp (!) 97.5 F (36.4 C)   Resp 16   Ht $R'5\' 7"'ze$  (1.702 m)   Wt 153 lb (69.4 kg)   BMI 23.96 kg/m     Physical Exam   General: Appearance:    Well developed, well nourished female in no acute distress  Eyes:    PERRL, conjunctiva/corneas clear, EOM's intact       Lungs:     Clear to auscultation bilaterally, respirations unlabored  Heart:    Normal heart rate. Normal rhythm. No murmurs, rubs, or gallops.    MS:   All extremities are intact.    Neurologic:   Awake, alert, oriented x 3. No apparent focal neurological defect.         Results for orders placed or performed in visit on 11/13/20  POCT glycosylated hemoglobin (Hb A1C)  Result Value Ref Range   Hemoglobin A1C 6.5 (A) 4.0 - 5.6 %   Est. average glucose Bld gHb Est-mCnc 140     Assessment & Plan     1. Type 2 diabetes mellitus with stage 3b chronic kidney disease, without long-term current use of insulin ([place]) Very well controlled. Continue current medications.    2. Primary hypertension Well controlled.  Continue current medications.    Future Appointments  Date Time Provider [place]  04/06/2021 10:20 AM Birdie Sons, MD [facility]         The entirety of the information documented in the History of Present Illness, Review of Systems and Physical Exam were personally obtained by me. Portions of this information were initially documented by the CMA and reviewed by me for thoroughness and  accuracy.     Lelon Huh, MD  [facility] [phone] (phone) [phone] (fax)  [place]

## 2020-12-30 DIAGNOSIS — E113293 Type 2 diabetes mellitus with mild nonproliferative diabetic retinopathy without macular edema, bilateral: Secondary | ICD-10-CM | POA: Diagnosis not present

## 2020-12-30 LAB — HM DIABETES EYE EXAM

## 2021-01-12 ENCOUNTER — Other Ambulatory Visit: Payer: Self-pay | Admitting: Family Medicine

## 2021-01-13 NOTE — Telephone Encounter (Signed)
Requested medications are due for refill today.  yes  Requested medications are on the active medications list.  yes  Last refill. 07/29/2020  Future visit scheduled.   yes  Notes to clinic.  Labs are expired.

## 2021-02-16 ENCOUNTER — Other Ambulatory Visit: Payer: Self-pay | Admitting: Family Medicine

## 2021-02-16 DIAGNOSIS — E1122 Type 2 diabetes mellitus with diabetic chronic kidney disease: Secondary | ICD-10-CM

## 2021-02-16 NOTE — Telephone Encounter (Signed)
Requested Prescriptions  Pending Prescriptions Disp Refills  . Blood Glucose Monitoring Suppl (ACCU-CHEK GUIDE ME) w/Device KIT [Pharmacy Med Name: ACCU-CHEK GUIDE ME KIT] 1 kit 0    Sig: USE TO TEST BLOOD SUGAR  ONCE DAILY     Endocrinology: Diabetes - Testing Supplies Passed - 02/16/2021  9:23 AM      Passed - Valid encounter within last 12 months    Recent Outpatient Visits          3 months ago Type 2 diabetes mellitus with stage 3b chronic kidney disease, without long-term current use of insulin ([place])   [facility] Birdie Sons, MD   9 months ago Type 2 diabetes mellitus with stage 3b chronic kidney disease, without long-term current use of insulin ([place])   [facility] Birdie Sons, MD   1 year ago Type 2 diabetes mellitus with stage 3b chronic kidney disease, without long-term current use of insulin ([place])   [facility] Birdie Sons, MD   1 year ago Type 2 diabetes mellitus with stage 3b chronic kidney disease, without long-term current use of insulin ([employer])   [facility] Birdie Sons, MD   1 year ago Type 2 diabetes mellitus with stage 3b chronic kidney disease, without long-term current use of insulin [facility])   [facility] Caryn Section, Kirstie Peri, MD

## 2021-03-16 DIAGNOSIS — D631 Anemia in chronic kidney disease: Secondary | ICD-10-CM | POA: Diagnosis not present

## 2021-03-16 DIAGNOSIS — I10 Essential (primary) hypertension: Secondary | ICD-10-CM | POA: Diagnosis not present

## 2021-03-16 DIAGNOSIS — E1129 Type 2 diabetes mellitus with other diabetic kidney complication: Secondary | ICD-10-CM | POA: Diagnosis not present

## 2021-03-16 DIAGNOSIS — N184 Chronic kidney disease, stage 4 (severe): Secondary | ICD-10-CM | POA: Diagnosis not present

## 2021-03-16 LAB — CBC AND DIFFERENTIAL
HCT: 30 — AB (ref 36–46)
Hemoglobin: 9.6 — AB (ref 12.0–16.0)
Platelets: 233 (ref 150–399)
WBC: 5.7

## 2021-03-16 LAB — BASIC METABOLIC PANEL
BUN: 41 — AB (ref 4–21)
CO2: 24 — AB (ref 13–22)
Chloride: 107 (ref 99–108)
Creatinine: 1.5 — AB (ref 0.5–1.1)
Glucose: 169
Potassium: 5.1 (ref 3.4–5.3)
Sodium: 140 (ref 137–147)

## 2021-03-16 LAB — COMPREHENSIVE METABOLIC PANEL
Albumin: 4.2 (ref 3.5–5.0)
Calcium: 9.3 (ref 8.7–10.7)
GFR calc non Af Amer: 36

## 2021-03-16 LAB — MICROALBUMIN / CREATININE URINE RATIO: Microalb Creat Ratio: 950

## 2021-03-16 LAB — CBC: RBC: 3.07 — AB (ref 3.87–5.11)

## 2021-03-16 LAB — MICROALBUMIN, URINE: Microalb, Ur: 38

## 2021-03-16 IMAGING — CT CT HEAD W/O CM
3 series · 16 of 46 positions shown, 19 images · non-contrast
Comparison: None.

CLINICAL DATA: Dizziness and nausea

EXAM:
CT HEAD WITHOUT CONTRAST
TECHNIQUE: Contiguous axial images were obtained from the base of the skull
through the vertex without intravenous contrast.

[Series 3: head wo · axial · 0.41mm/px · z∈[-110,+10]mm · 10 of 29 slices shown, 13 images]
[im 3/29  brain]
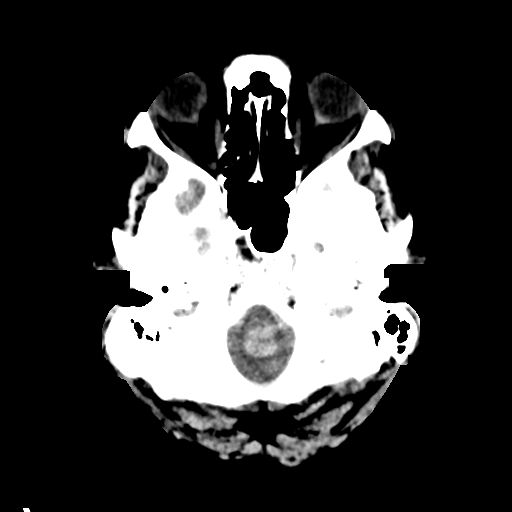
[im 3/29  bone]
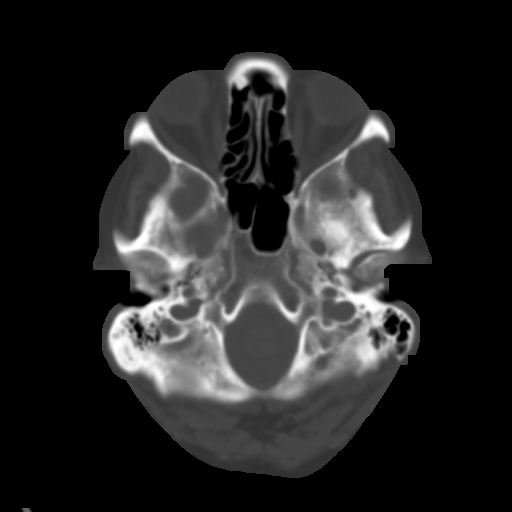
[im 6/29  brain]
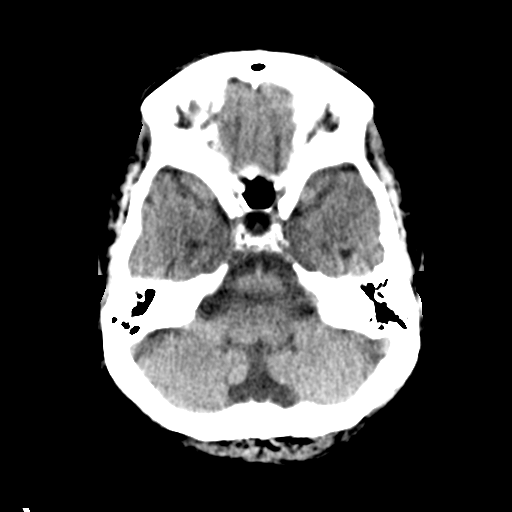
[im 8/29  brain]
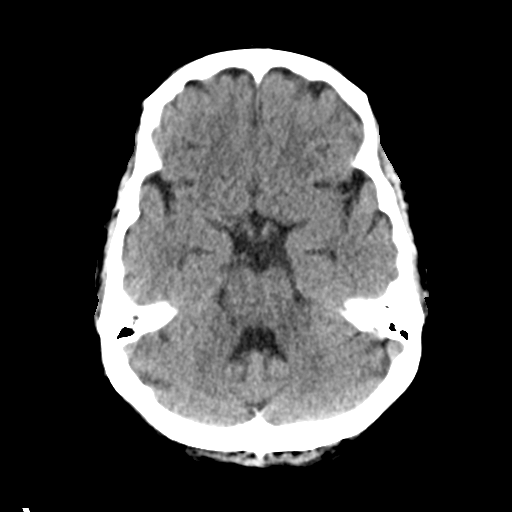
[im 11/29  brain]
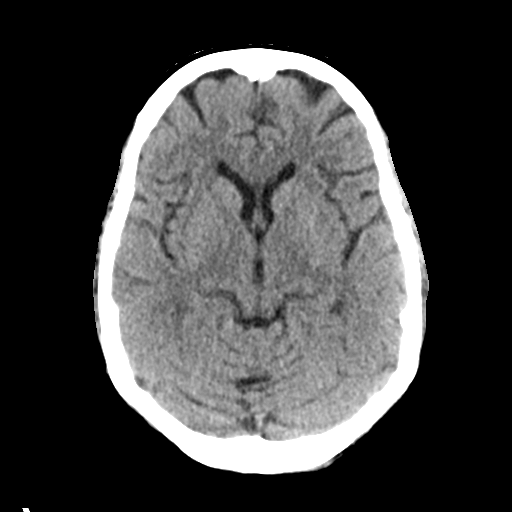
[im 14/29  brain]
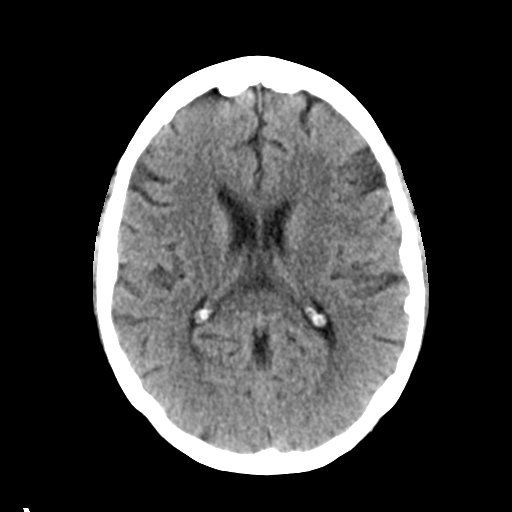
[im 14/29  bone]
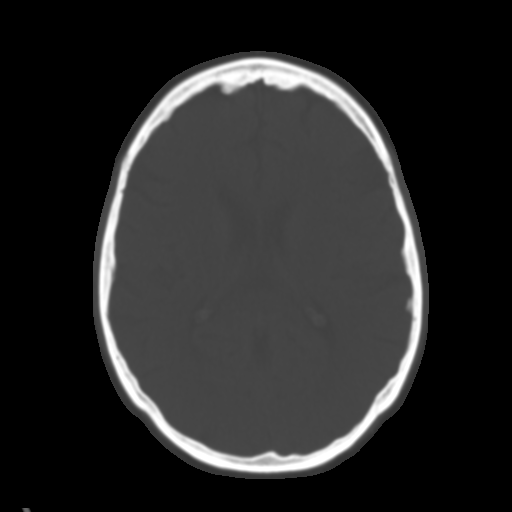
[im 16/29  brain]
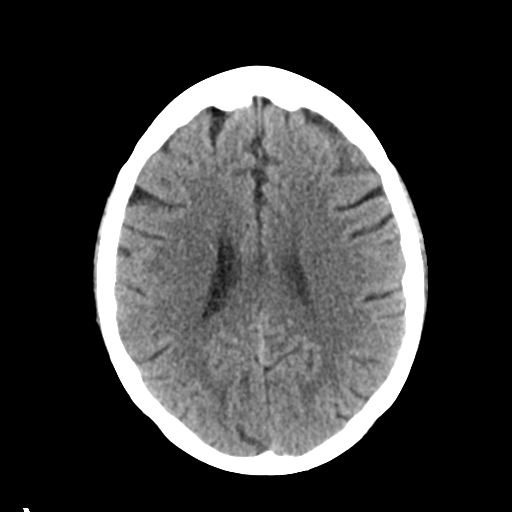
[im 19/29  brain]
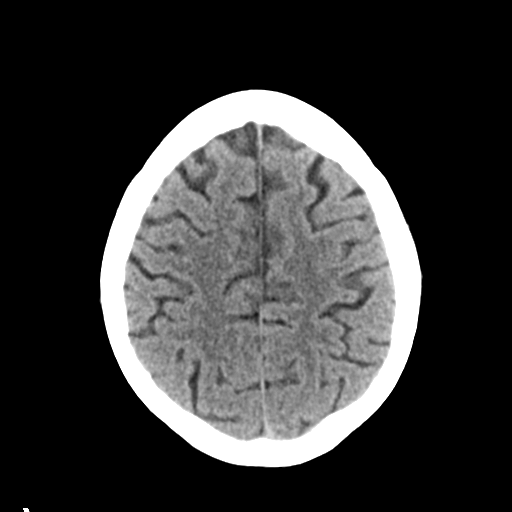
[im 22/29  brain]
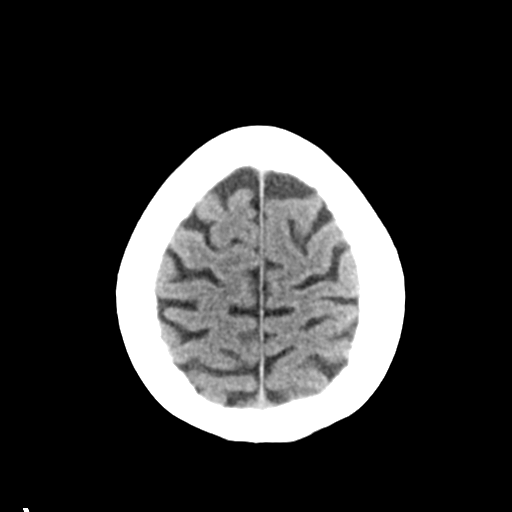
[im 24/29  brain]
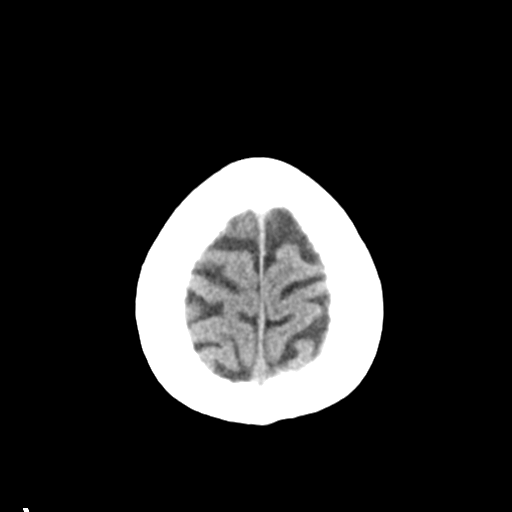
[im 24/29  bone]
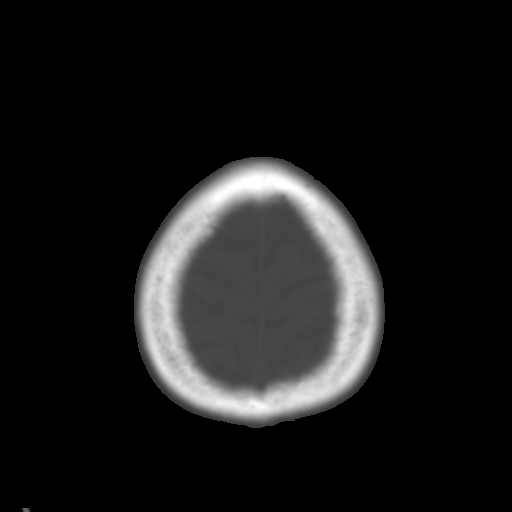
[im 27/29  brain]
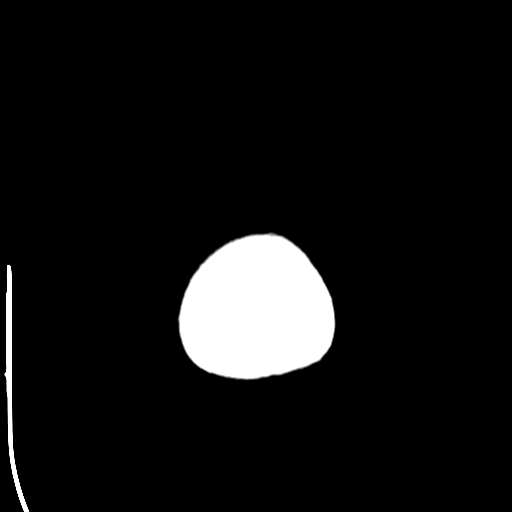

[Series 4: coronal soft tissue · coronal · 0.31mm/px · 3 of 61 slices shown]
[im 21/61  brain]
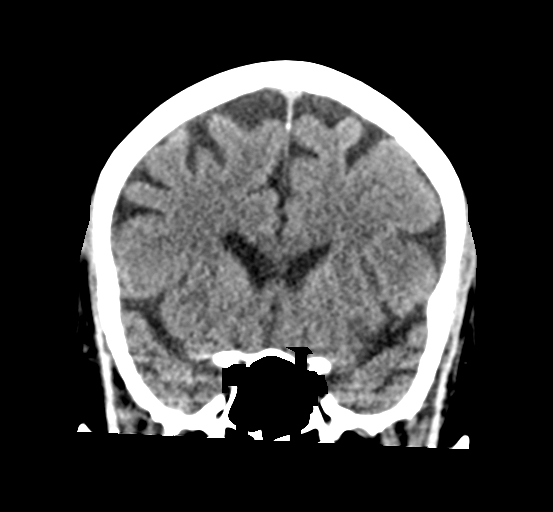
[im 27/61  brain]
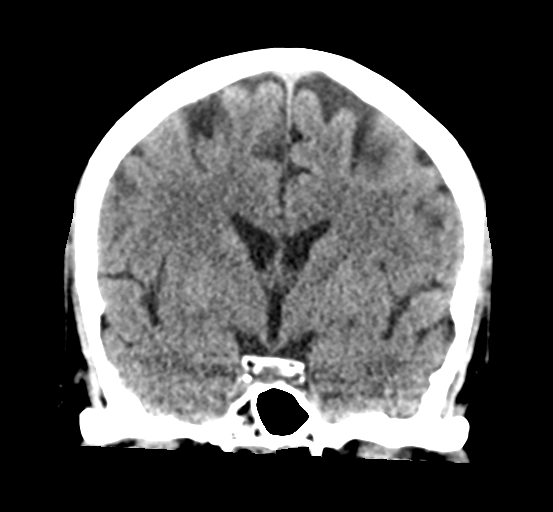
[im 34/61  brain]
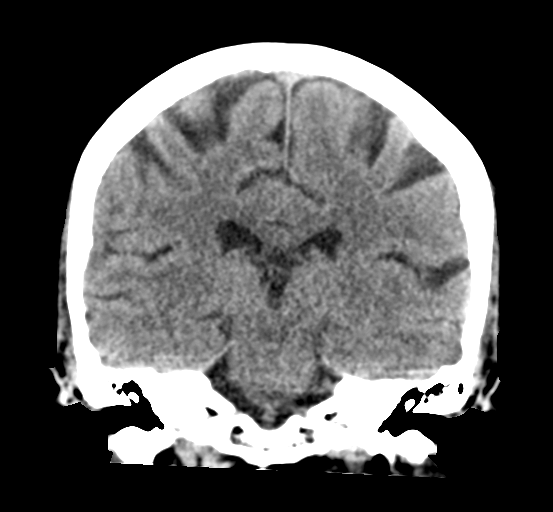

[Series 5: sagittal soft tissue · sagittal · 0.33mm/px · 3 of 52 slices shown]
[im 18/52  brain]
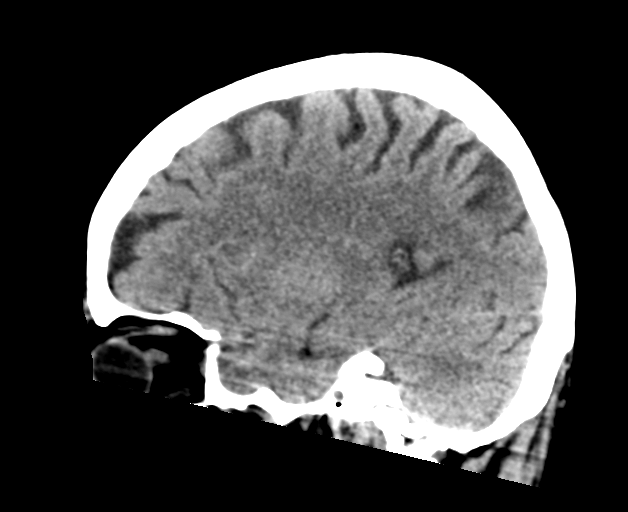
[im 26/52  brain]
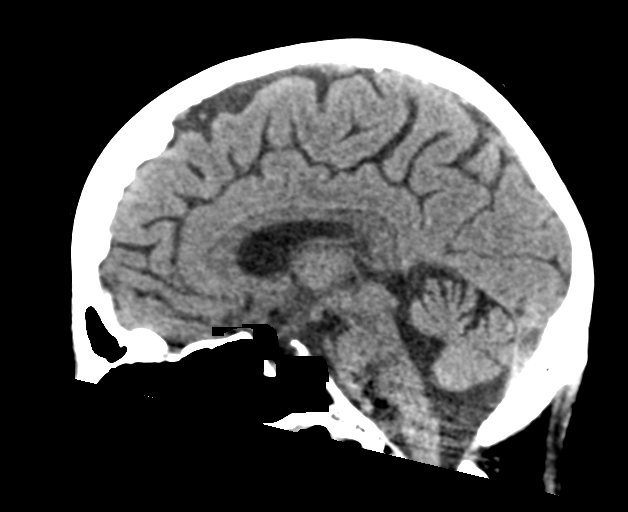
[im 35/52  brain]
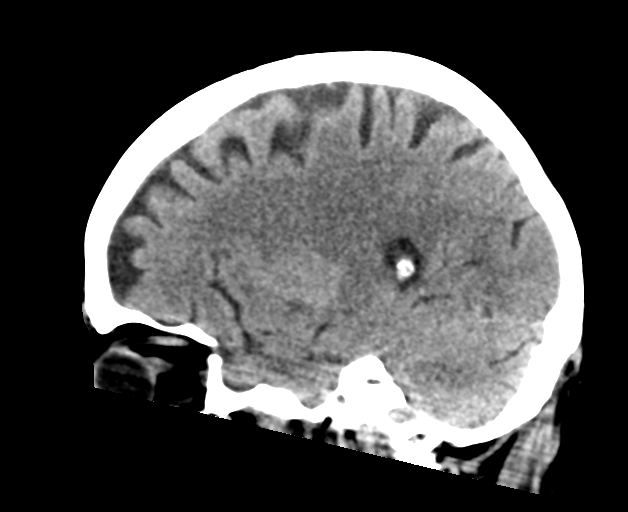

[16 of 46 positions shown; findings below may reference images not displayed]

FINDINGS: Brain: Ventricles and sulci are within normal limits for age. There
is no intracranial mass, hemorrhage, extra-axial fluid collection,
or midline shift. Brain parenchyma appears unremarkable. No acute
infarct evident.

Vascular: There is no appreciable hyperdense vessel. There is
calcification in each carotid siphon.

Skull: The bony calvarium appears intact.

Sinuses/Orbits: There is mucosal thickening in several ethmoid air
cells. Other visualized paranasal sinuses are clear. Visualized
orbits appear symmetric bilaterally.

Other: Mastoid air cells are clear.
IMPRESSION: Normal appearing brain parenchyma. No acute infarct. No mass or
hemorrhage.

There are foci of arterial vascular calcification. There is mucosal
thickening in several ethmoid air cells.

## 2021-04-06 ENCOUNTER — Other Ambulatory Visit: Payer: Self-pay

## 2021-04-06 ENCOUNTER — Ambulatory Visit (INDEPENDENT_AMBULATORY_CARE_PROVIDER_SITE_OTHER): Payer: Medicare Other | Admitting: Family Medicine

## 2021-04-06 ENCOUNTER — Encounter: Payer: Self-pay | Admitting: Family Medicine

## 2021-04-06 VITALS — BP 154/61 | HR 75 | Temp 97.9°F | Resp 16 | Ht 67.0 in | Wt 163.0 lb

## 2021-04-06 DIAGNOSIS — E7800 Pure hypercholesterolemia, unspecified: Secondary | ICD-10-CM | POA: Diagnosis not present

## 2021-04-06 DIAGNOSIS — Z23 Encounter for immunization: Secondary | ICD-10-CM

## 2021-04-06 DIAGNOSIS — E1122 Type 2 diabetes mellitus with diabetic chronic kidney disease: Secondary | ICD-10-CM | POA: Diagnosis not present

## 2021-04-06 DIAGNOSIS — N1832 Chronic kidney disease, stage 3b: Secondary | ICD-10-CM | POA: Diagnosis not present

## 2021-04-06 DIAGNOSIS — I10 Essential (primary) hypertension: Secondary | ICD-10-CM

## 2021-04-06 DIAGNOSIS — Z0000 Encounter for general adult medical examination without abnormal findings: Secondary | ICD-10-CM

## 2021-04-06 NOTE — Patient Instructions (Addendum)
Please review the attached list of medications and notify my office if there are any errors.   Please bring all of your medications to every appointment so we can make sure that our medication list is the same as yours.   The CDC recommends two doses of Shingrix (the shingles vaccine) separated by 2 to 6 months for adults age 79 years and older. I recommend checking with your pharmacy plan regarding coverage for this vaccine.   Check with your insurance to see how much it would cost to change to Jardiance 10mg , and Farxiga 5mg  for diabetes and to help kidneys.  I strongly recommend a Covid bivalent (omicron) booster if you have not yet had one

## 2021-04-06 NOTE — Progress Notes (Signed)
Complete Physical Exam     Patient: Rachel Lester, Female    DOB: 1941-01-13, 80 y.o.   MRN: [id number] Visit Date: 04/06/2021  Today's Provider: Lelon Huh, MD    Subjective    Rachel Lester is a 81 y.o. female who presents today for her complete physical examination  She reports consuming a general diet. Home exercise routine includes walking 3 times a week for 10 minutes. She generally feels fairly well. She reports sleeping fairly well. She does not have additional problems to discuss today.   HPI Diabetes Mellitus Type II, Follow-up  Lab Results  Component Value Date   HGBA1C 6.5 (A) 11/13/2020   HGBA1C 6.7 (A) 05/15/2020   HGBA1C 6.8 (A) 01/13/2020   Wt Readings from Last 3 Encounters:  04/06/21 163 lb (73.9 kg)  11/13/20 153 lb (69.4 kg)  05/15/20 162 lb 3.2 oz (73.6 kg)   Last seen for diabetes 5 months ago.  Management since then includes continue same medication. She reports good compliance with treatment. She is not having side effects.  Symptoms: No fatigue No foot ulcerations  No appetite changes No nausea  No paresthesia of the feet  No polydipsia  No polyuria No visual disturbances   No vomiting     Home blood sugar records: fasting range: 150's  Episodes of hypoglycemia? No    Current insulin regiment: none Most Recent Eye Exam: 12/30/2020 Current exercise: walking Current diet habits: keto diet  Pertinent Labs: Lab Results  Component Value Date   CHOL 158 03/19/2019   HDL 53 03/19/2019   LDLCALC 86 03/19/2019   TRIG 105 03/19/2019   CHOLHDL 3.0 03/19/2019   Lab Results  Component Value Date   NA 140 03/16/2021   K 5.1 03/16/2021   CREATININE 1.5 (A) 03/16/2021   GFRNONAA 36 03/16/2021   MICROALBUR 21.4 01/02/2020     ---------------------------------------------------------------------------------------------------   Hypertension, follow-up  BP Readings from Last 3 Encounters:  04/06/21 (!) 154/61  11/13/20 111/62   05/15/20 (!) 147/68   Wt Readings from Last 3 Encounters:  04/06/21 163 lb (73.9 kg)  11/13/20 153 lb (69.4 kg)  05/15/20 162 lb 3.2 oz (73.6 kg)     She was last seen for hypertension 5 months ago.  BP at that visit was 111/62. Management since that visit includes continue same medication.  She reports good compliance with treatment. She is not having side effects.  She is following a  keto  diet. She is exercising. She does not smoke.  Use of agents associated with hypertension: none.   Outside blood pressures are checked occasionally. Symptoms: No chest pain No chest pressure  No palpitations No syncope  No dyspnea No orthopnea  No paroxysmal nocturnal dyspnea No lower extremity edema   Pertinent labs: Lab Results  Component Value Date   CHOL 158 03/19/2019   HDL 53 03/19/2019   LDLCALC 86 03/19/2019   TRIG 105 03/19/2019   CHOLHDL 3.0 03/19/2019     The ASCVD Risk score (Arnett DK, et al., 2019) failed to calculate for the following reasons:   The patient has a prior MI or stroke diagnosis   ---------------------------------------------------------------------------------------------------    Medications: Outpatient Medications Prior to Visit  Medication Sig   ACCU-CHEK AVIVA PLUS test strip CHECK BLOOD SUGAR THREE  TIMES DAILY   amLODipine (NORVASC) 5 MG tablet Take 1 tablet (5 mg total) by mouth daily. PLEASE NOTE CHANGE TO 5MG TABLETS   b complex vitamins tablet  Take 1 tablet by mouth daily.   Blood Glucose Monitoring Suppl (ACCU-CHEK GUIDE ME) w/Device KIT USE TO TEST BLOOD SUGAR  ONCE DAILY   Cholecalciferol (VITAMIN D3) 2000 UNITS capsule Take 2,000 Units by mouth daily.   clopidogrel (PLAVIX) 75 MG tablet TAKE 1 TABLET BY MOUTH  DAILY   glimepiride (AMARYL) 4 MG tablet TAKE 1 TABLET BY MOUTH IN  THE MORNING   losartan (COZAAR) 25 MG tablet Take 1 tablet by mouth daily.   Magnesium 500 MG TABS Take by mouth daily.   metFORMIN (GLUCOPHAGE) 500 MG tablet  Take 1 tablet (500 mg total) by mouth daily with supper.   pioglitazone (ACTOS) 45 MG tablet TAKE 1 TABLET BY MOUTH  DAILY   rosuvastatin (CRESTOR) 20 MG tablet TAKE 1 TABLET BY MOUTH  DAILY   torsemide (DEMADEX) 20 MG tablet Take 20 mg by mouth daily as needed.    zinc gluconate 50 MG tablet Take 50 mg by mouth daily.   No facility-administered medications prior to visit.    Allergies  Allergen Reactions   Metformin Hcl     Weight Loss-anorexia     Patient Care Team: Birdie Sons, MD as PCP - General (Family Medicine) Murlean Iba, MD (Nephrology) Olean Ree, MD as Consulting Physician (General Surgery) Isaias Sakai, MD as Referring Physician (Ophthalmology) Corey Skains, MD as Consulting Physician (Cardiology)  Review of Systems  Constitutional:  Negative for appetite change, chills, fatigue and fever.  HENT:  Negative for congestion, ear pain, rhinorrhea, sneezing and sore throat.   Eyes: Negative.  Negative for pain and redness.  Respiratory:  Negative for cough, chest tightness, shortness of breath and wheezing.   Cardiovascular:  Negative for chest pain, palpitations and leg swelling.  Gastrointestinal:  Negative for abdominal pain, blood in stool, constipation, diarrhea, nausea and vomiting.  Endocrine: Negative for polydipsia and polyphagia.  Genitourinary: Negative.  Negative for dysuria, flank pain, hematuria, pelvic pain, vaginal bleeding and vaginal discharge.  Musculoskeletal:  Negative for arthralgias, back pain, gait problem and joint swelling.  Skin:  Negative for rash.  Neurological: Negative.  Negative for dizziness, tremors, seizures, weakness, light-headedness, numbness and headaches.  Hematological:  Negative for adenopathy.  Psychiatric/Behavioral: Negative.  Negative for behavioral problems, confusion and dysphoric mood. The patient is not nervous/anxious and is not hyperactive.        Objective    Vitals: BP (!) 154/61 (BP Location: Right  Arm, Patient Position: Sitting, Cuff Size: Normal)    Pulse 75    Temp 97.9 F (36.6 C) (Oral)    Resp 16    Ht _0  (1.702 m)    Wt 163 lb (73.9 kg)    SpO2 100% Comment: room air   BMI 25.53 kg/m    Physical Exam   General Appearance:     Well developed, well nourished female. Alert, cooperative, in no acute distress, appears stated age   Head:    Normocephalic, without obvious abnormality, atraumatic  Eyes:    PERRL, conjunctiva/corneas clear, EOM's intact, fundi    benign, both eyes  Ears:    Normal TM's and external ear canals, both ears  Nose:   Nares normal, septum midline, mucosa normal, no drainage    or sinus tenderness  Throat:   Lips, mucosa, and tongue normal; teeth and gums normal  Neck:   Supple, symmetrical, trachea midline, no adenopathy;    thyroid:  no enlargement/tenderness/nodules; no carotid   bruit or JVD  Back:  Symmetric, no curvature, ROM normal, no CVA tenderness  Lungs:     Clear to auscultation bilaterally, respirations unlabored  Chest Wall:    No tenderness or deformity   Heart:    Normal heart rate. Normal rhythm. No murmurs, rubs, or gallops.    Breast Exam:    deferred  Abdomen:     Soft, non-tender, bowel sounds active all four quadrants,    no masses, no organomegaly  Pelvic:    deferred  Extremities:   All extremities are intact. No cyanosis or edema  Pulses:   2+ and symmetric all extremities  Skin:   Skin color, texture, turgor normal, no rashes or lesions  Lymph nodes:   Cervical, supraclavicular, and axillary nodes normal  Neurologic:   CNII-XII intact, normal strength, sensation and reflexes    throughout      Assessment & Plan     1. Annual physical exam Generally doing well. She states she had Shingrix at [facility] in [place] about 2 years ago.   2. Need for influenza vaccination  - Flu Vaccine QUAD High Dose IM (Fluad)  3. Primary hypertension Stable on current medications. Followed by Dr. Candiss Norse. Will likely be starting  SGLT-1 inhibitor for kidney protection which will likely help with BP.  - EKG 12-Lead  4. Type 2 diabetes mellitus with stage 3b chronic kidney disease, without long-term current use of insulin ([place]) Doing well. She reports that Dr. Candiss Norse suggested she discuss changing metformin to [place] or Jardiance for renal protection.  - VITAMIN D 25 Hydroxy (Vit-D Deficiency, Fractures) - Hemoglobin A1c  5. Hypercholesterolemia She is tolerating rosuvastatin well with no adverse effects.   - Lipid panel     The entirety of the information documented in the History of Present Illness, Review of Systems and Physical Exam were personally obtained by me. Portions of this information were initially documented by the CMA and reviewed by me for thoroughness and accuracy.     Lelon Huh, MD  [facility] [phone] (phone) [phone] (fax)  [place]

## 2021-04-06 NOTE — Progress Notes (Signed)
° ° ° °Annual Wellness Visit ° °  °Patient: Rachel Lester, Female    DOB: 03/23/1941, 80 y.o.   MRN: [id number] °Visit Date: 04/06/2021 ° °Today's Provider: Donald Fisher, MD  ° ° °Subjective  °  °Rachel Lester is a 80 y.o. female who presents today for her Annual Wellness Visit. ° ° °Medications: °Outpatient Medications Prior to Visit  °Medication Sig  ° ACCU-CHEK AVIVA PLUS test strip CHECK BLOOD SUGAR THREE  TIMES DAILY  ° amLODipine (NORVASC) 5 MG tablet Take 1 tablet (5 mg total) by mouth daily. PLEASE NOTE CHANGE TO 5MG TABLETS  ° b complex vitamins tablet Take 1 tablet by mouth daily.  ° Blood Glucose Monitoring Suppl (ACCU-CHEK GUIDE ME) w/Device KIT USE TO TEST BLOOD SUGAR  ONCE DAILY  ° Cholecalciferol (VITAMIN D3) 2000 UNITS capsule Take 2,000 Units by mouth daily.  ° clopidogrel (PLAVIX) 75 MG tablet TAKE 1 TABLET BY MOUTH  DAILY  ° glimepiride (AMARYL) 4 MG tablet TAKE 1 TABLET BY MOUTH IN  THE MORNING  ° losartan (COZAAR) 25 MG tablet Take 1 tablet by mouth daily.  ° Magnesium 500 MG TABS Take by mouth daily.  ° metFORMIN (GLUCOPHAGE) 500 MG tablet Take 1 tablet (500 mg total) by mouth daily with supper.  ° pioglitazone (ACTOS) 45 MG tablet TAKE 1 TABLET BY MOUTH  DAILY  ° rosuvastatin (CRESTOR) 20 MG tablet TAKE 1 TABLET BY MOUTH  DAILY  ° torsemide (DEMADEX) 20 MG tablet Take 20 mg by mouth daily as needed.   ° zinc gluconate 50 MG tablet Take 50 mg by mouth daily.  ° °No facility-administered medications prior to visit.  °  °Allergies  °Allergen Reactions  ° Metformin Hcl   °  Weight Loss-anorexia °  ° ° °Patient Care Team: °Fisher, Donald E, MD as PCP - General (Family Medicine) °Singh, Harmeet, MD (Nephrology) °Piscoya, Jose, MD as Consulting Physician (General Surgery) °Zhang, Yang, MD as Referring Physician (Ophthalmology) °Kowalski, Bruce J, MD as Consulting Physician (Cardiology) ° ° ° ° °  ° Objective  °  ° ° °Most recent functional status assessment: °In your present state of health, do  you have any difficulty performing the following activities: 04/06/2021  °Hearing? N  °Vision? N  °Difficulty concentrating or making decisions? N  °Walking or climbing stairs? N  °Dressing or bathing? N  °Doing errands, shopping? N  °Some recent data might be hidden  ° °Most recent fall risk assessment: °Fall Risk  04/06/2021  °Falls in the past year? 0  °Number falls in past yr: 0  °Injury with Fall? 0  °Risk for fall due to : No Fall Risks  °Follow up Falls evaluation completed  ° ° Most recent depression screenings: °PHQ 2/9 Scores 04/06/2021 12/19/2019  °PHQ - 2 Score 0 0  °PHQ- 9 Score 0 -  ° °Most recent cognitive screening: °6CIT Screen 12/19/2019  °What Year? 0 points  °What month? 0 points  °What time? 0 points  °Count back from 20 0 points  °Months in reverse 0 points  °Repeat phrase 0 points  °Total Score 0  ° °Most recent Audit-C alcohol use screening °Alcohol Use Disorder Test (AUDIT) 12/19/2019  °1. How often do you have a drink containing alcohol? 1  °2. How many drinks containing alcohol do you have on a typical day when you are drinking? 0  °3. How often do you have six or more drinks on one occasion? 0  °AUDIT-C Score 1  °Alcohol   Brief Interventions/Follow-up AUDIT Score <7 follow-up not indicated  ° °A score of 3 or more in women, and 4 or more in men indicates increased risk for alcohol abuse, EXCEPT if all of the points are from question 1  ° ° ° Assessment & Plan  °  ° °Annual wellness visit done today including the all of the following: °Reviewed patient's Family Medical History °Reviewed and updated list of patient's medical providers °Assessment of cognitive impairment was done °Assessed patient's functional ability °Established a written schedule for health screening services °Health Risk Assessent Completed and Reviewed ° °Exercise Activities and Dietary recommendations ° Goals   ° °  Exercise 3x per week (30 min per time)   °  Recommend increasing time walking on treadmill to 20-30 minutes at  a time vs 10 minutes.  °  ° °  ° ° °Immunization History  °Administered Date(s) Administered  ° Fluad Quad(high Dose 65+) 01/02/2019, 01/13/2020, 04/06/2021  ° Influenza, High Dose Seasonal PF 01/12/2015, 03/29/2016, 01/31/2017, 01/02/2018  ° Pneumococcal Conjugate-13 10/07/2013  ° Pneumococcal Polysaccharide-23 02/17/2004, 02/09/2011  ° Td 09/01/2006  ° Tdap 09/29/2016  ° Zoster, Live 04/09/2009  ° ° °Health Maintenance  °Topic Date Due  ° COVID-19 Vaccine (1) Never done  ° Zoster Vaccines- Shingrix (1 of 2) Never done  ° FOOT EXAM  09/29/2017  ° HEMOGLOBIN A1C  05/16/2021  ° OPHTHALMOLOGY EXAM  12/30/2021  ° DEXA SCAN  12/01/2022  ° TETANUS/TDAP  09/30/2026  ° Pneumonia Vaccine 65+ Years old  Completed  ° INFLUENZA VACCINE  Completed  ° Hepatitis C Screening  Completed  ° HPV VACCINES  Aged Out  ° ° ° °Discussed health benefits of physical activity, and encouraged her to engage in regular exercise appropriate for her age and condition.  °  °  °  ° °The entirety of the information documented in the History of Present Illness, Review of Systems and Physical Exam were personally obtained by me. Portions of this information were initially documented by the CMA and reviewed by me for thoroughness and accuracy.   ° ° °Donald Fisher, MD  °[facility] °[phone] (phone) °[phone] (fax) ° °[facility]   °

## 2021-04-07 LAB — LIPID PANEL
Chol/HDL Ratio: 2.2 ratio (ref 0.0–4.4)
Cholesterol, Total: 132 mg/dL (ref 100–199)
HDL: 59 mg/dL (ref >39)
LDL Chol Calc (NIH): 58 mg/dL (ref 0–99)
Triglycerides: 76 mg/dL (ref 0–149)
VLDL Cholesterol Cal: 15 mg/dL (ref 5–40)

## 2021-04-07 LAB — HEMOGLOBIN A1C
Est. average glucose Bld gHb Est-mCnc: 143 mg/dL
Hgb A1c MFr Bld: 6.6 % — ABNORMAL HIGH (ref 4.8–5.6)

## 2021-04-07 LAB — VITAMIN D 25 HYDROXY (VIT D DEFICIENCY, FRACTURES): Vit D, 25-Hydroxy: 37.9 ng/mL (ref 30.0–100.0)

## 2021-05-14 ENCOUNTER — Other Ambulatory Visit: Payer: Self-pay | Admitting: Family Medicine

## 2021-05-14 DIAGNOSIS — E113311 Type 2 diabetes mellitus with moderate nonproliferative diabetic retinopathy with macular edema, right eye: Secondary | ICD-10-CM

## 2021-05-14 NOTE — Telephone Encounter (Signed)
Requested Prescriptions  Pending Prescriptions Disp Refills   pioglitazone (ACTOS) 45 MG tablet [Pharmacy Med Name: Pioglitazone HCl 45 MG Oral Tablet] 90 tablet 1    Sig: TAKE 1 TABLET BY MOUTH  DAILY     Endocrinology:  Diabetes - Glitazones - pioglitazone Passed - 05/14/2021 10:48 PM      Passed - HBA1C is between 0 and 7.9 and within 180 days    Hgb A1c MFr Bld  Date Value Ref Range Status  04/06/2021 6.6 (H) 4.8 - 5.6 % Final    Comment:             Prediabetes: 5.7 - 6.4          Diabetes: >6.4          Glycemic control for adults with diabetes: <7.0          Passed - Valid encounter within last 6 months    Recent Outpatient Visits          1 month ago Need for influenza vaccination   [facility] Birdie Sons, MD   6 months ago Type 2 diabetes mellitus with stage 3b chronic kidney disease, without long-term current use of insulin ([place])   [facility] Birdie Sons, MD   12 months ago Type 2 diabetes mellitus with stage 3b chronic kidney disease, without long-term current use of insulin ([place])   [facility] Birdie Sons, MD   1 year ago Type 2 diabetes mellitus with stage 3b chronic kidney disease, without long-term current use of insulin ([place])   [facility] Birdie Sons, MD   1 year ago Type 2 diabetes mellitus with stage 3b chronic kidney disease, without long-term current use of insulin [facility])   [place], Kirstie Peri, MD      Future Appointments            In 3 months Fisher, Kirstie Peri, MD [facility]

## 2021-06-04 ENCOUNTER — Other Ambulatory Visit: Payer: Self-pay | Admitting: Family Medicine

## 2021-06-05 NOTE — Telephone Encounter (Signed)
Requested Prescriptions  Pending Prescriptions Disp Refills   glimepiride (AMARYL) 4 MG tablet [Pharmacy Med Name: Glimepiride 4 MG Oral Tablet] 90 tablet 1    Sig: TAKE 1 TABLET BY MOUTH IN  THE MORNING     Endocrinology:  Diabetes - Sulfonylureas Failed - 06/04/2021 11:07 PM      Failed - Cr in normal range and within 360 days    Creatinine  Date Value Ref Range Status  03/16/2021 1.5 (A) 0.5 - 1.1 Final  05/12/2013 1.23 0.60 - 1.30 mg/dL Final   Creatinine, Ser  Date Value Ref Range Status  03/19/2019 1.43 (H) 0.57 - 1.00 mg/dL Final   Creatinine, POC  Date Value Ref Range Status  01/31/2017 n/a mg/dL Final         Passed - HBA1C is between 0 and 7.9 and within 180 days    Hgb A1c MFr Bld  Date Value Ref Range Status  04/06/2021 6.6 (H) 4.8 - 5.6 % Final    Comment:             Prediabetes: 5.7 - 6.4          Diabetes: >6.4          Glycemic control for adults with diabetes: <7.0          Passed - Valid encounter within last 6 months    Recent Outpatient Visits          2 months ago Need for influenza vaccination   [facility] Birdie Sons, MD   6 months ago Type 2 diabetes mellitus with stage 3b chronic kidney disease, without long-term current use of insulin ([place])   [facility] Birdie Sons, MD   1 year ago Type 2 diabetes mellitus with stage 3b chronic kidney disease, without long-term current use of insulin ([place])   [facility] Birdie Sons, MD   1 year ago Type 2 diabetes mellitus with stage 3b chronic kidney disease, without long-term current use of insulin ([place])   [facility] Birdie Sons, MD   1 year ago Type 2 diabetes mellitus with stage 3b chronic kidney disease, without long-term current use of insulin [facility])   [place], Kirstie Peri, MD      Future Appointments            In 2 months Fisher, Kirstie Peri, MD [facility], [place]

## 2021-07-01 DIAGNOSIS — I6523 Occlusion and stenosis of bilateral carotid arteries: Secondary | ICD-10-CM | POA: Diagnosis not present

## 2021-07-01 DIAGNOSIS — N1832 Chronic kidney disease, stage 3b: Secondary | ICD-10-CM | POA: Diagnosis not present

## 2021-07-01 DIAGNOSIS — I6381 Other cerebral infarction due to occlusion or stenosis of small artery: Secondary | ICD-10-CM | POA: Diagnosis not present

## 2021-07-01 DIAGNOSIS — R0602 Shortness of breath: Secondary | ICD-10-CM | POA: Diagnosis not present

## 2021-07-01 DIAGNOSIS — E7800 Pure hypercholesterolemia, unspecified: Secondary | ICD-10-CM | POA: Diagnosis not present

## 2021-07-01 DIAGNOSIS — I10 Essential (primary) hypertension: Secondary | ICD-10-CM | POA: Diagnosis not present

## 2021-07-22 DIAGNOSIS — E1122 Type 2 diabetes mellitus with diabetic chronic kidney disease: Secondary | ICD-10-CM | POA: Diagnosis not present

## 2021-07-22 DIAGNOSIS — D631 Anemia in chronic kidney disease: Secondary | ICD-10-CM | POA: Diagnosis not present

## 2021-07-22 DIAGNOSIS — N390 Urinary tract infection, site not specified: Secondary | ICD-10-CM | POA: Diagnosis not present

## 2021-07-22 DIAGNOSIS — N1832 Chronic kidney disease, stage 3b: Secondary | ICD-10-CM | POA: Diagnosis not present

## 2021-07-22 DIAGNOSIS — I10 Essential (primary) hypertension: Secondary | ICD-10-CM | POA: Diagnosis not present

## 2021-08-23 ENCOUNTER — Ambulatory Visit (INDEPENDENT_AMBULATORY_CARE_PROVIDER_SITE_OTHER): Payer: Medicare Other | Admitting: Family Medicine

## 2021-08-23 ENCOUNTER — Encounter: Payer: Self-pay | Admitting: Family Medicine

## 2021-08-23 VITALS — BP 121/53 | HR 70 | Temp 97.8°F | Resp 16 | Ht 67.0 in | Wt 157.1 lb

## 2021-08-23 DIAGNOSIS — N1832 Chronic kidney disease, stage 3b: Secondary | ICD-10-CM

## 2021-08-23 DIAGNOSIS — E1122 Type 2 diabetes mellitus with diabetic chronic kidney disease: Secondary | ICD-10-CM

## 2021-08-23 DIAGNOSIS — I10 Essential (primary) hypertension: Secondary | ICD-10-CM

## 2021-08-23 LAB — POCT GLYCOSYLATED HEMOGLOBIN (HGB A1C)
Est. average glucose Bld gHb Est-mCnc: 134
Hemoglobin A1C: 6.3 % — AB (ref 4.0–5.6)

## 2021-08-23 MED ORDER — AMLODIPINE BESYLATE 5 MG PO TABS: 5.0000 mg | ORAL_TABLET | Freq: Every day | ORAL | 4 refills | Status: DC

## 2021-08-23 NOTE — Progress Notes (Signed)
I,Joseline E Rosas,acting as a scribe for Lelon Huh, MD.,have documented all relevant documentation on the behalf of Lelon Huh, MD,as directed by  Lelon Huh, MD while in the presence of Lelon Huh, MD.   Established patient visit   Patient: Rachel Lester   DOB: Jun 22, 1940   81 y.o. Female  MRN: [id number] Visit Date: 08/23/2021  Today's healthcare provider: Lelon Huh, MD   Chief Complaint  Patient presents with   follow-up DM   Subjective    HPI  Diabetes Mellitus Type II, Follow-up  Lab Results  Component Value Date   HGBA1C 6.6 (H) 04/06/2021   HGBA1C 6.5 (A) 11/13/2020   HGBA1C 6.7 (A) 05/15/2020   Wt Readings from Last 3 Encounters:  08/23/21 157 lb 1.6 oz (71.3 kg)  04/06/21 163 lb (73.9 kg)  11/13/20 153 lb (69.4 kg)   Last seen for diabetes 4 months ago.  Management since then includes continue current medications. Reports that she has started [place] by Dr. Candiss Norse for CKD and is doing well on that medication.  She reports excellent compliance with treatment. She is not having side effects.  Symptoms: No fatigue No foot ulcerations  No appetite changes No nausea  No paresthesia of the feet  No polydipsia  No polyuria No visual disturbances   No vomiting     Home blood sugar records: fasting range: 120's- 130's  Episodes of hypoglycemia? No    Current insulin regiment: none Most Recent Eye Exam: 12/30/2020 Current exercise: walking and treadmill and stationary bike Current diet habits: Keto, low carbs  Pertinent Labs: Lab Results  Component Value Date   CHOL 132 04/06/2021   HDL 59 04/06/2021   LDLCALC 58 04/06/2021   TRIG 76 04/06/2021   CHOLHDL 2.2 04/06/2021   Lab Results  Component Value Date   NA 140 03/16/2021   K 5.1 03/16/2021   CREATININE 1.5 (A) 03/16/2021   GFRNONAA 36 03/16/2021   MICROALBUR 21.4 01/02/2020      ---------------------------------------------------------------------------------------------------  Hypertension, follow-up  BP Readings from Last 3 Encounters:  08/23/21 (!) 121/53  04/06/21 (!) 154/61  11/13/20 111/62   Wt Readings from Last 3 Encounters:  08/23/21 157 lb 1.6 oz (71.3 kg)  04/06/21 163 lb (73.9 kg)  11/13/20 153 lb (69.4 kg)     She was last seen for hypertension 10 months ago.  BP at that visit was 111/62. Management since that visit includes continue current medications.  She reports excellent compliance with treatment. She is not having side effects.  She does not smoke.   Outside blood pressures are 110'-130's/70's Does not checked everyday. Symptoms: No chest pain No chest pressure  No palpitations No syncope  No dyspnea No orthopnea  No paroxysmal nocturnal dyspnea No lower extremity edema    The ASCVD Risk score (Arnett DK, et al., 2019) failed to calculate for the following reasons:   The 2019 ASCVD risk score is only valid for ages 58 to 62   The patient has a prior MI or stroke diagnosis  ---------------------------------------------------------------------------------------------------  Medications: Outpatient Medications Prior to Visit  Medication Sig   ACCU-CHEK AVIVA PLUS test strip CHECK BLOOD SUGAR THREE  TIMES DAILY   amLODipine (NORVASC) 5 MG tablet Take 1 tablet (5 mg total) by mouth daily. PLEASE NOTE CHANGE TO 5MG TABLETS   Blood Glucose Monitoring Suppl (ACCU-CHEK GUIDE ME) w/Device KIT USE TO TEST BLOOD SUGAR  ONCE DAILY   Cholecalciferol (VITAMIN D3) 2000 UNITS capsule Take 2,000  Units by mouth daily.   clopidogrel (PLAVIX) 75 MG tablet TAKE 1 TABLET BY MOUTH  DAILY   Cyanocobalamin (VITAMIN B12) 500 MCG TABS Take by mouth.   FARXIGA 5 MG TABS tablet Take 5 mg by mouth daily.   Ferrous Sulfate (IRON) 325 (65 Fe) MG TABS Take by mouth.   glimepiride (AMARYL) 4 MG tablet TAKE 1 TABLET BY MOUTH IN  THE MORNING   losartan  (COZAAR) 25 MG tablet Take 1 tablet by mouth daily.   Magnesium 500 MG TABS Take by mouth daily.   metFORMIN (GLUCOPHAGE) 500 MG tablet Take 1 tablet (500 mg total) by mouth daily with supper.   pioglitazone (ACTOS) 45 MG tablet TAKE 1 TABLET BY MOUTH  DAILY   Probiotic Product (UP4 PROBIOTICS ADULT) CAPS Take by mouth.   pyridOXINE (VITAMIN B-6) 100 MG tablet Take 100 mg by mouth daily.   rosuvastatin (CRESTOR) 20 MG tablet TAKE 1 TABLET BY MOUTH  DAILY   torsemide (DEMADEX) 20 MG tablet Take 20 mg by mouth daily as needed.    vitamin C (ASCORBIC ACID) 500 MG tablet Take 500 mg by mouth daily.   zinc gluconate 50 MG tablet Take 50 mg by mouth daily.   b complex vitamins tablet Take 1 tablet by mouth daily.   No facility-administered medications prior to visit.        Objective    BP (!) 121/53 (BP Location: Left Arm, Patient Position: Sitting, Cuff Size: Normal)   Pulse 70   Temp 97.8 F (36.6 C) (Oral)   Resp 16   Ht _0  (1.702 m)   Wt 157 lb 1.6 oz (71.3 kg)   BMI 24.61 kg/m    Physical Exam   General appearance: Well developed, well nourished female, cooperative and in no acute distress Head: Normocephalic, without obvious abnormality, atraumatic Respiratory: Respirations even and unlabored, normal respiratory rate Extremities: All extremities are intact.  Skin: Skin color, texture, turgor normal. No rashes seen  Psych: Appropriate mood and affect. Neurologic: Mental status: Alert, oriented to person, place, and time, thought content appropriate.   Results for orders placed or performed in visit on 08/23/21  POCT glycosylated hemoglobin (Hb A1C)  Result Value Ref Range   Hemoglobin A1C 6.3 (A) 4.0 - 5.6 %   Est. average glucose Bld gHb Est-mCnc 134      Assessment & Plan     1. Type 2 diabetes mellitus with stage 3b chronic kidney disease, without long-term current use of insulin (HCC) Doing well on current medications, Wilder Glade recently initiated by Dr. Merita Norton  for CKD. Continue current medications.  Consider stopping metformin or reducing pioglitazone if A1c continues to drop.   2. Essential hypertension Well controlled.  Continue current medications for now. Consider reducing amlodipine if BP remains consistently low.  refill amLODipine (NORVASC) 5 MG tablet; Take 1 tablet (5 mg total) by mouth daily.  Dispense: 90 tablet; Refill: 4     The entirety of the information documented in the History of Present Illness, Review of Systems and Physical Exam were personally obtained by me. Portions of this information were initially documented by the CMA and reviewed by me for thoroughness and accuracy.     Lelon Huh, MD  [facility] [phone] (phone) [phone] (fax)  [place]

## 2021-08-23 NOTE — Patient Instructions (Signed)
.   Please review the attached list of medications and notify my office if there are any errors.   . Please bring all of your medications to every appointment so we can make sure that our medication list is the same as yours.   

## 2021-09-07 ENCOUNTER — Other Ambulatory Visit: Payer: Self-pay | Admitting: Family Medicine

## 2021-10-04 ENCOUNTER — Other Ambulatory Visit: Payer: Self-pay | Admitting: Family Medicine

## 2021-10-04 DIAGNOSIS — E1122 Type 2 diabetes mellitus with diabetic chronic kidney disease: Secondary | ICD-10-CM

## 2021-10-22 ENCOUNTER — Other Ambulatory Visit: Payer: Self-pay | Admitting: Family Medicine

## 2021-10-22 DIAGNOSIS — E113311 Type 2 diabetes mellitus with moderate nonproliferative diabetic retinopathy with macular edema, right eye: Secondary | ICD-10-CM

## 2021-12-14 DIAGNOSIS — D631 Anemia in chronic kidney disease: Secondary | ICD-10-CM | POA: Diagnosis not present

## 2021-12-14 DIAGNOSIS — E1122 Type 2 diabetes mellitus with diabetic chronic kidney disease: Secondary | ICD-10-CM | POA: Diagnosis not present

## 2021-12-14 DIAGNOSIS — N1832 Chronic kidney disease, stage 3b: Secondary | ICD-10-CM | POA: Diagnosis not present

## 2021-12-14 DIAGNOSIS — I10 Essential (primary) hypertension: Secondary | ICD-10-CM | POA: Diagnosis not present

## 2021-12-15 LAB — MICROALBUMIN / CREATININE URINE RATIO: Microalb Creat Ratio: 327

## 2021-12-21 ENCOUNTER — Ambulatory Visit (INDEPENDENT_AMBULATORY_CARE_PROVIDER_SITE_OTHER): Payer: Medicare Other | Admitting: Family Medicine

## 2021-12-21 ENCOUNTER — Encounter: Payer: Self-pay | Admitting: Family Medicine

## 2021-12-21 VITALS — BP 118/56 | HR 73 | Temp 98.2°F | Resp 16 | Wt 151.0 lb

## 2021-12-21 DIAGNOSIS — N1832 Chronic kidney disease, stage 3b: Secondary | ICD-10-CM | POA: Diagnosis not present

## 2021-12-21 DIAGNOSIS — I10 Essential (primary) hypertension: Secondary | ICD-10-CM

## 2021-12-21 DIAGNOSIS — E1122 Type 2 diabetes mellitus with diabetic chronic kidney disease: Secondary | ICD-10-CM

## 2021-12-21 LAB — POCT GLYCOSYLATED HEMOGLOBIN (HGB A1C)
Est. average glucose Bld gHb Est-mCnc: 134
Hemoglobin A1C: 6.3 % — AB (ref 4.0–5.6)

## 2021-12-21 NOTE — Patient Instructions (Addendum)
Please review the attached list of medications and notify my office if there are any errors.   We're going to change to amlodipine to 2.'5mg'$  once a day when your next refill is sent to you in October.

## 2021-12-21 NOTE — Progress Notes (Signed)
 I,Jana Robinson,acting as a scribe for Donald Fisher, MD.,have documented all relevant documentation on the behalf of Donald Fisher, MD,as directed by  Donald Fisher, MD while in the presence of Donald Fisher, MD.    Established patient visit   Patient: Rachel Lester   DOB: 08/20/1940   81 y.o. Female  MRN: [id number] Visit Date: 12/21/2021  Today's healthcare provider: Donald Fisher, MD   Chief Complaint  Patient presents with   Diabetes   Subjective    Diabetes Mellitus Type II, Follow-up  Lab Results  Component Value Date   HGBA1C 6.3 (A) 08/23/2021   HGBA1C 6.6 (H) 04/06/2021   HGBA1C 6.5 (A) 11/13/2020   Wt Readings from Last 3 Encounters:  12/21/21 151 lb (68.5 kg)  08/23/21 157 lb 1.6 oz (71.3 kg)  04/06/21 163 lb (73.9 kg)   Last seen for diabetes 4 months ago.  Management since then includes:  Continue current medications.  Consider stopping metformin or reducing pioglitazone if A1c continues to drop.  She reports excellent compliance with treatment. She is not having side effects.  Symptoms: No fatigue No foot ulcerations  No appetite changes No nausea  No paresthesia of the feet  No polydipsia  No polyuria No visual disturbances   No vomiting     Home blood sugar records: fasting range: average 130 and under  Episodes of hypoglycemia? No    Current insulin regiment:  Most Recent Eye Exam: 12-30-20 Current exercise: bicycling, mowing, walking  Current diet habits: in general, a "healthy" diet     Pertinent Labs: Lab Results  Component Value Date   CHOL 132 04/06/2021   HDL 59 04/06/2021   LDLCALC 58 04/06/2021   TRIG 76 04/06/2021   CHOLHDL 2.2 04/06/2021   Lab Results  Component Value Date   NA 140 03/16/2021   K 5.1 03/16/2021   CREATININE 1.5 (A) 03/16/2021   GFRNONAA 36 03/16/2021   MICROALBUR 38 03/16/2021     --------------------------------------------------------------------------------------------------- Hypertension,  follow-up  BP Readings from Last 3 Encounters:  12/21/21 (!) 118/56  08/23/21 (!) 121/53  04/06/21 (!) 154/61   Wt Readings from Last 3 Encounters:  12/21/21 151 lb (68.5 kg)  08/23/21 157 lb 1.6 oz (71.3 kg)  04/06/21 163 lb (73.9 kg)     She was last seen for hypertension 4 months ago.  BP at that visit was 121/53 . Management since that visit includes:Continue current medications for now. Consider reducing amlodipine if BP remains consistently low  She reports excellent compliance with treatment. She is not having side effects.  She is following a Regular diet. She is exercising. She does not smoke.  Use of agents associated with hypertension: none.   Outside blood pressures are average 132/71 and under  ---------------------------------------------------------------------------------------------------   Medications: Outpatient Medications Prior to Visit  Medication Sig   ACCU-CHEK AVIVA PLUS test strip CHECK BLOOD SUGAR THREE  TIMES DAILY   amLODipine (NORVASC) 5 MG tablet Take 1 tablet (5 mg total) by mouth daily.   b complex vitamins tablet Take 1 tablet by mouth daily.   Blood Glucose Monitoring Suppl (ACCU-CHEK GUIDE ME) w/Device KIT USE TO TEST BLOOD SUGAR  ONCE DAILY   Cholecalciferol (VITAMIN D3) 2000 UNITS capsule Take 2,000 Units by mouth daily.   clopidogrel (PLAVIX) 75 MG tablet TAKE 1 TABLET BY MOUTH  DAILY   Cyanocobalamin (VITAMIN B12) 500 MCG TABS Take by mouth.   FARXIGA 5 MG TABS tablet Take 5 mg by mouth daily.     Ferrous Sulfate (IRON) 325 (65 Fe) MG TABS Take by mouth.   glimepiride (AMARYL) 4 MG tablet TAKE 1 TABLET BY MOUTH IN THE  MORNING   losartan (COZAAR) 25 MG tablet Take 1 tablet by mouth daily.   Magnesium 500 MG TABS Take by mouth daily.   metFORMIN (GLUCOPHAGE) 500 MG tablet TAKE 1 TABLET BY MOUTH  DAILY WITH SUPPER   pioglitazone (ACTOS) 45 MG tablet TAKE 1 TABLET BY MOUTH DAILY   Probiotic Product (UP4 PROBIOTICS ADULT) CAPS Take by  mouth.   pyridOXINE (VITAMIN B-6) 100 MG tablet Take 100 mg by mouth daily.   rosuvastatin (CRESTOR) 20 MG tablet TAKE 1 TABLET BY MOUTH  DAILY   torsemide (DEMADEX) 20 MG tablet Take 20 mg by mouth daily as needed.    vitamin C (ASCORBIC ACID) 500 MG tablet Take 500 mg by mouth daily.   zinc gluconate 50 MG tablet Take 50 mg by mouth daily.   No facility-administered medications prior to visit.    Review of Systems  Respiratory: Negative.  Negative for cough, shortness of breath and wheezing.   Cardiovascular:  Negative for chest pain, palpitations and leg swelling.  Neurological:  Negative for weakness and headaches.       Objective      Vitals:   12/21/21 1053 12/21/21 1057  BP: (!) 115/54 (!) 118/56  Pulse: 73   Resp: 16   Temp: 98.2 F (36.8 C)   SpO2: 100%     Physical Exam    General: Appearance:    Well developed, well nourished female in no acute distress  Eyes:    PERRL, conjunctiva/corneas clear, EOM's intact       Lungs:     Clear to auscultation bilaterally, respirations unlabored  Heart:    Normal heart rate. Normal rhythm. No murmurs, rubs, or gallops.    MS:   All extremities are intact.    Neurologic:   Awake, alert, oriented x 3. No apparent focal neurological defect.        Results for orders placed or performed in visit on 12/21/21  POCT glycosylated hemoglobin (Hb A1C)  Result Value Ref Range   Hemoglobin A1C 6.3 (A) 4.0 - 5.6 %   Est. average glucose Bld gHb Est-mCnc 134     Assessment & Plan     1. Type 2 diabetes mellitus with stage 3b chronic kidney disease, without long-term current use of insulin (HCC) Well controlled.  Continue current medications.    2. Primary hypertension Very well controlled, particularly since Farxiga was added. Will reduce amlodipine to 2.5mg once a day when it is refilled through her mail order in October.       The entirety of the information documented in the History of Present Illness, Review of Systems  and Physical Exam were personally obtained by me. Portions of this information were initially documented by the CMA and reviewed by me for thoroughness and accuracy.     Donald Fisher, MD  [facility] [phone] (phone) [phone] (fax)  [facility] 

## 2022-01-06 ENCOUNTER — Other Ambulatory Visit: Payer: Self-pay | Admitting: Family Medicine

## 2022-01-06 DIAGNOSIS — E113311 Type 2 diabetes mellitus with moderate nonproliferative diabetic retinopathy with macular edema, right eye: Secondary | ICD-10-CM | POA: Diagnosis not present

## 2022-01-06 LAB — HM DIABETES EYE EXAM

## 2022-01-13 ENCOUNTER — Other Ambulatory Visit: Payer: Self-pay | Admitting: Family Medicine

## 2022-01-13 DIAGNOSIS — I10 Essential (primary) hypertension: Secondary | ICD-10-CM

## 2022-01-13 MED ORDER — AMLODIPINE BESYLATE 2.5 MG PO TABS: 2.5000 mg | ORAL_TABLET | Freq: Every day | ORAL | 3 refills | Status: DC

## 2022-01-19 DIAGNOSIS — I10 Essential (primary) hypertension: Secondary | ICD-10-CM | POA: Diagnosis not present

## 2022-01-19 DIAGNOSIS — E1129 Type 2 diabetes mellitus with other diabetic kidney complication: Secondary | ICD-10-CM | POA: Diagnosis not present

## 2022-01-19 DIAGNOSIS — E1122 Type 2 diabetes mellitus with diabetic chronic kidney disease: Secondary | ICD-10-CM | POA: Diagnosis not present

## 2022-01-19 DIAGNOSIS — N1832 Chronic kidney disease, stage 3b: Secondary | ICD-10-CM | POA: Diagnosis not present

## 2022-01-19 DIAGNOSIS — N184 Chronic kidney disease, stage 4 (severe): Secondary | ICD-10-CM | POA: Diagnosis not present

## 2022-01-20 LAB — COMPREHENSIVE METABOLIC PANEL: eGFR: 25

## 2022-02-09 DIAGNOSIS — N1832 Chronic kidney disease, stage 3b: Secondary | ICD-10-CM | POA: Diagnosis not present

## 2022-04-20 ENCOUNTER — Telehealth: Payer: Self-pay | Admitting: Family Medicine

## 2022-04-20 NOTE — Telephone Encounter (Signed)
Left message for patient to call back and schedule Medicare Annual Wellness Visit (AWV) in office.   If not able to come in office, please offer to do virtually or by telephone.  Left office number and my jabber [phone].  Last AWV:04/06/2021   Please schedule at anytime with Nurse Health Advisor.

## 2022-04-29 ENCOUNTER — Encounter: Payer: Self-pay | Admitting: Family Medicine

## 2022-04-29 ENCOUNTER — Ambulatory Visit (INDEPENDENT_AMBULATORY_CARE_PROVIDER_SITE_OTHER): Payer: Medicare Other | Admitting: Family Medicine

## 2022-04-29 VITALS — BP 139/54 | HR 75 | Wt 148.4 lb

## 2022-04-29 DIAGNOSIS — I10 Essential (primary) hypertension: Secondary | ICD-10-CM

## 2022-04-29 DIAGNOSIS — E7800 Pure hypercholesterolemia, unspecified: Secondary | ICD-10-CM

## 2022-04-29 DIAGNOSIS — N1832 Chronic kidney disease, stage 3b: Secondary | ICD-10-CM

## 2022-04-29 DIAGNOSIS — E1122 Type 2 diabetes mellitus with diabetic chronic kidney disease: Secondary | ICD-10-CM | POA: Diagnosis not present

## 2022-04-29 DIAGNOSIS — Z23 Encounter for immunization: Secondary | ICD-10-CM | POA: Diagnosis not present

## 2022-04-29 LAB — POCT GLYCOSYLATED HEMOGLOBIN (HGB A1C)
Est. average glucose Bld gHb Est-mCnc: 151
Hemoglobin A1C: 6.9 % — AB (ref 4.0–5.6)

## 2022-04-29 NOTE — Progress Notes (Signed)
I,Rachel Lester,acting as a Education administrator for Rachel Huh, MD.,have documented all relevant documentation on the behalf of Rachel Huh, MD,as directed by  Rachel Huh, MD while in the presence of Rachel Huh, MD.   Established patient visit   Patient: Rachel Lester   DOB: Sep 28, 1940   82 y.o. Female  MRN: [id number] Visit Date: 04/29/2022  Today's healthcare provider: Lelon Huh, MD   No chief complaint on file.  Subjective    HPI  Diabetes Mellitus Type II, Follow-up  Lab Results  Component Value Date   HGBA1C 6.3 (A) 12/21/2021   HGBA1C 6.3 (A) 08/23/2021   HGBA1C 6.6 (H) 04/06/2021   Wt Readings from Last 3 Encounters:  12/21/21 151 lb (68.5 kg)  08/23/21 157 lb 1.6 oz (71.3 kg)  04/06/21 163 lb (73.9 kg)   Last seen for diabetes 4 months ago.  Management since then includes continue current treatment. Home blood sugar records:  130-150  Episodes of hypoglycemia? No    Current insulin regiment: none Most Recent Eye Exam: 01/06/22  Pertinent Labs: Lab Results  Component Value Date   CHOL 132 04/06/2021   HDL 59 04/06/2021   LDLCALC 58 04/06/2021   TRIG 76 04/06/2021   CHOLHDL 2.2 04/06/2021   Lab Results  Component Value Date   NA 140 03/16/2021   K 5.1 03/16/2021   CREATININE 1.5 (A) 03/16/2021   GFRNONAA 36 03/16/2021   MICRALBCREAT 950 03/16/2021     ---------------------------------------------------------------------------------------------------  Hypertension, follow-up  BP Readings from Last 3 Encounters:  12/21/21 (!) 118/56  08/23/21 (!) 121/53  04/06/21 (!) 154/61   Wt Readings from Last 3 Encounters:  12/21/21 151 lb (68.5 kg)  08/23/21 157 lb 1.6 oz (71.3 kg)  04/06/21 163 lb (73.9 kg)     She was last seen for hypertension 4 months ago.  BP at that visit was 118/56. Management since that visit includes reduce amlodipine to 2.'5mg'$ . She finished her '5mg'$  tablets by taking 1/2 a day then changed to 2.'5mg'$  tablets. However she  now states she is taking 1/2 losartan every day, although this is typically prescribed by her nephrologist.   She reports excellent compliance with treatment. She is not having side effects.   Outside blood pressures are not checked regularly. . Pertinent labs Lab Results  Component Value Date   CHOL 132 04/06/2021   HDL 59 04/06/2021   LDLCALC 58 04/06/2021   TRIG 76 04/06/2021   CHOLHDL 2.2 04/06/2021   Lab Results  Component Value Date   NA 140 03/16/2021   K 5.1 03/16/2021   CREATININE 1.5 (A) 03/16/2021   GFRNONAA 36 03/16/2021   GLUCOSE 240 (H) 03/19/2019     The ASCVD Risk score (Arnett DK, et al., 2019) failed to calculate for the following reasons:   The 2019 ASCVD risk score is only valid for ages 60 to 61   The patient has a prior MI or stroke diagnosis  ---------------------------------------------------------------------------------------------------   Medications: Outpatient Medications Prior to Visit  Medication Sig   ACCU-CHEK AVIVA PLUS test strip CHECK BLOOD SUGAR THREE  TIMES DAILY   amLODipine (NORVASC) 2.5 MG tablet Take 1 tablet (2.5 mg total) by mouth daily.   b complex vitamins tablet Take 1 tablet by mouth daily.   Blood Glucose Monitoring Suppl (ACCU-CHEK GUIDE ME) w/Device KIT USE TO TEST BLOOD SUGAR  ONCE DAILY   Cholecalciferol (VITAMIN D3) 2000 UNITS capsule Take 2,000 Units by mouth daily.   clopidogrel (PLAVIX) 75  MG tablet TAKE 1 TABLET BY MOUTH  DAILY   Cyanocobalamin (VITAMIN B12) 500 MCG TABS Take by mouth.   FARXIGA 5 MG TABS tablet Take 5 mg by mouth daily.   Ferrous Sulfate (IRON) 325 (65 Fe) MG TABS Take by mouth.   glimepiride (AMARYL) 4 MG tablet TAKE 1 TABLET BY MOUTH IN THE  MORNING   losartan (COZAAR) 25 MG tablet Take 1 tablet by mouth daily.   Magnesium 500 MG TABS Take by mouth daily.   metFORMIN (GLUCOPHAGE) 500 MG tablet TAKE 1 TABLET BY MOUTH  DAILY WITH SUPPER   pioglitazone (ACTOS) 45 MG tablet TAKE 1 TABLET BY MOUTH  DAILY   Probiotic Product (UP4 PROBIOTICS ADULT) CAPS Take by mouth.   pyridOXINE (VITAMIN B-6) 100 MG tablet Take 100 mg by mouth daily.   rosuvastatin (CRESTOR) 20 MG tablet TAKE 1 TABLET BY MOUTH  DAILY   torsemide (DEMADEX) 20 MG tablet Take 20 mg by mouth daily as needed.    vitamin C (ASCORBIC ACID) 500 MG tablet Take 500 mg by mouth daily.   zinc gluconate 50 MG tablet Take 50 mg by mouth daily.   No facility-administered medications prior to visit.    Review of Systems  Constitutional:  Negative for appetite change, chills, fatigue and fever.  Respiratory:  Negative for chest tightness and shortness of breath.   Cardiovascular:  Negative for chest pain and palpitations.  Gastrointestinal:  Negative for abdominal pain, nausea and vomiting.  Neurological:  Negative for dizziness and weakness.      Objective    BP (!) 139/54 (BP Location: Right Arm, Patient Position: Sitting, Cuff Size: Normal)   Pulse 75   Wt 148 lb 6.4 oz (67.3 kg)   SpO2 100%   BMI 23.24 kg/m    Physical Exam   General: Appearance:    Well developed, well nourished female in no acute distress  Eyes:    PERRL, conjunctiva/corneas clear, EOM's intact       Lungs:     Clear to auscultation bilaterally, respirations unlabored  Heart:    Normal heart rate. Normal rhythm. No murmurs, rubs, or gallops.    MS:   All extremities are intact.    Neurologic:   Awake, alert, oriented x 3. No apparent focal neurological defect.        Results for orders placed or performed in visit on 04/29/22  POCT HgB A1C  Result Value Ref Range   Hemoglobin A1C 6.9 (A) 4.0 - 5.6 %   Est. average glucose Bld gHb Est-mCnc 151     Assessment & Plan     1. Type 2 diabetes mellitus with stage 3b chronic kidney disease, without long-term current use of insulin ([place])  - Renal function panel  She is currently only taking 1/2 tablet losartan which is prescribed by her nephrologist, but is supposed to be taking a full tablet.  If electrolytes are normal will go back up to 1 full tablet daily.   2. Hypercholesterolemia She is tolerating rosuvastatin well with no adverse effects.   - Lipid panel  3. Primary hypertension Doing well current medications.   4. Chronic kidney disease, stage 3b ([place]) Continue routine follow up with nephrology.   5. Need for immunization against influenza  - Flu Vaccine QUAD High Dose(Fluad)      The entirety of the information documented in the History of Present Illness, Review of Systems and Physical Exam were personally obtained by me. Portions of this information  were initially documented by the CMA and reviewed by me for thoroughness and accuracy.     Rachel Huh, MD  [facility] [phone] (phone) [phone] (fax)  [place]

## 2022-04-30 LAB — LIPID PANEL
Chol/HDL Ratio: 2.4 ratio (ref 0.0–4.4)
Cholesterol, Total: 167 mg/dL (ref 100–199)
HDL: 69 mg/dL (ref >39)
LDL Chol Calc (NIH): 81 mg/dL (ref 0–99)
Triglycerides: 94 mg/dL (ref 0–149)
VLDL Cholesterol Cal: 17 mg/dL (ref 5–40)

## 2022-04-30 LAB — RENAL FUNCTION PANEL
Albumin: 4.4 g/dL (ref 3.8–4.8)
BUN/Creatinine Ratio: 39 — ABNORMAL HIGH (ref 12–28)
BUN: 61 mg/dL — ABNORMAL HIGH (ref 8–27)
CO2: 19 mmol/L — ABNORMAL LOW (ref 20–29)
Calcium: 9.2 mg/dL (ref 8.7–10.3)
Chloride: 106 mmol/L (ref 96–106)
Creatinine, Ser: 1.57 mg/dL — ABNORMAL HIGH (ref 0.57–1.00)
Glucose: 158 mg/dL — ABNORMAL HIGH (ref 70–99)
Phosphorus: 4.1 mg/dL (ref 3.0–4.3)
Potassium: 5.1 mmol/L (ref 3.5–5.2)
Sodium: 140 mmol/L (ref 134–144)
eGFR: 33 mL/min/{1.73_m2} — ABNORMAL LOW (ref >59)

## 2022-05-24 ENCOUNTER — Ambulatory Visit (INDEPENDENT_AMBULATORY_CARE_PROVIDER_SITE_OTHER): Payer: Medicare Other

## 2022-05-24 VITALS — Ht 67.0 in | Wt 148.0 lb

## 2022-05-24 DIAGNOSIS — Z0000 Encounter for general adult medical examination without abnormal findings: Secondary | ICD-10-CM

## 2022-05-24 DIAGNOSIS — Z780 Asymptomatic menopausal state: Secondary | ICD-10-CM

## 2022-05-24 NOTE — Progress Notes (Signed)
Virtual Visit via Telephone Note  I connected with  Selena Lesser on 05/24/22 at  8:30 AM EST by telephone and verified that I am speaking with the correct person using two identifiers.  Location: Patient: Home Provider: Office Persons participating in the virtual visit: patient/Nurse Health Advisor   I discussed the limitations, risks, security and privacy concerns of performing an evaluation and management service by telephone and the availability of in person appointments. The patient expressed understanding and agreed to proceed.  Interactive audio and video telecommunications were attempted between this nurse and patient, however failed, due to patient having technical difficulties OR patient did not have access to video capability.  We continued and completed visit with audio only.  Some vital signs may be absent or patient reported.   Menelik Mcfarren Moody Bruins, LPN  Subjective:   CHANTELLA CREECH is a 82 y.o. female who presents for Medicare Annual (Subsequent) preventive examination.  Review of Systems     Cardiac Risk Factors include: advanced age (>29mn, >>76women);hypertension;diabetes mellitus     Objective:    Today's Vitals   05/24/22 0826  Weight: 148 lb (67.1 kg)  Height: '5\' 7"'$  (1.702 m)   Body mass index is 23.18 kg/m.     05/24/2022    8:44 AM 12/19/2019    1:30 PM 01/15/2019    4:10 PM 12/28/2018   10:34 AM 12/17/2018    1:20 PM 11/17/2017    8:35 AM 11/16/2016    2:54 PM  Advanced Directives  Does Patient Have a Medical Advance Directive? No No No No No No No  Would patient like information on creating a medical advance directive? No - Patient declined No - Patient declined No - Patient declined  No - Patient declined Yes (MAU/Ambulatory/Procedural Areas - Information given) No - Patient declined    Current Medications (verified) Outpatient Encounter Medications as of 05/24/2022  Medication Sig   amLODipine (NORVASC) 2.5 MG tablet Take 1 tablet (2.5 mg total)  by mouth daily.   b complex vitamins tablet Take 1 tablet by mouth daily.   Cholecalciferol (VITAMIN D3) 2000 UNITS capsule Take 2,000 Units by mouth daily.   clopidogrel (PLAVIX) 75 MG tablet TAKE 1 TABLET BY MOUTH  DAILY   Cyanocobalamin (VITAMIN B12) 500 MCG TABS Take by mouth.   FARXIGA 5 MG TABS tablet Take 5 mg by mouth daily.   Ferrous Sulfate (IRON) 325 (65 Fe) MG TABS Take by mouth.   glimepiride (AMARYL) 4 MG tablet TAKE 1 TABLET BY MOUTH IN THE  MORNING   losartan (COZAAR) 25 MG tablet Take 1 tablet by mouth daily.   Magnesium 500 MG TABS Take by mouth daily.   metFORMIN (GLUCOPHAGE) 500 MG tablet TAKE 1 TABLET BY MOUTH  DAILY WITH SUPPER   pioglitazone (ACTOS) 45 MG tablet TAKE 1 TABLET BY MOUTH DAILY   Probiotic Product (UP4 PROBIOTICS ADULT) CAPS Take by mouth.   rosuvastatin (CRESTOR) 20 MG tablet TAKE 1 TABLET BY MOUTH  DAILY   torsemide (DEMADEX) 20 MG tablet Take 20 mg by mouth daily as needed.    vitamin C (ASCORBIC ACID) 500 MG tablet Take 500 mg by mouth daily.   zinc gluconate 50 MG tablet Take 50 mg by mouth daily.   ACCU-CHEK AVIVA PLUS test strip CHECK BLOOD SUGAR THREE  TIMES DAILY   Blood Glucose Monitoring Suppl (ACCU-CHEK GUIDE ME) w/Device KIT USE TO TEST BLOOD SUGAR  ONCE DAILY   pyridOXINE (VITAMIN B-6) 100 MG tablet  Take 100 mg by mouth daily. (Patient not taking: Reported on 05/24/2022)   No facility-administered encounter medications on file as of 05/24/2022.    Allergies (verified) Metformin hcl   History: Past Medical History:  Diagnosis Date   COVID-19 12/2018   Diabetes mellitus without complication ([place])    Hyperlipidemia    Hypertension    Past Surgical History:  Procedure Laterality Date   LIPOMA EXCISION Right 12/25/2018   In-office, right forearm lipomas x 2   Family History  Problem Relation Age of Onset   Alzheimer's disease Mother    Coronary artery disease Father    Arthritis Sister        Lupus   Diabetes Brother        Type  2   Diabetes Sister        Type 2   Cancer Sister    Social History   Socioeconomic History   Marital status: Widowed    Spouse name: Not on file   Number of children: 2   Years of education: Not on file   Highest education level: 12th grade  Occupational History   Occupation: retired   Occupation: has Engineer, building services  Tobacco Use   Smoking status: Former    Packs/day: 2.00    Years: 20.00    Total pack years: 40.00    Types: Cigarettes    Quit date: 1980    Years since quitting: 44.1   Smokeless tobacco: Never   Tobacco comments:    smoked about 2 packs per day, smoked for about 20 years; Quit in 1980  Vaping Use   Vaping Use: Never used  Substance and Sexual Activity   Alcohol use: Yes    Alcohol/week: 0.0 standard drinks of alcohol    Comment: occasional- 1/month (wine)   Drug use: No   Sexual activity: Not on file  Other Topics Concern   Not on file  Social History Narrative   Not on file   Social Determinants of Health   Financial Resource Strain: Low Risk  (05/24/2022)   Overall Financial Resource Strain (CARDIA)    Difficulty of Paying Living Expenses: Not hard at all  Food Insecurity: No Food Insecurity (05/24/2022)   Hunger Vital Sign    Worried About Running Out of Food in the Last Year: Never true    Ran Out of Food in the Last Year: Never true  Transportation Needs: No Transportation Needs (05/24/2022)   PRAPARE - Hydrologist (Medical): No    Lack of Transportation (Non-Medical): No  Physical Activity: Insufficiently Active (05/24/2022)   Exercise Vital Sign    Days of Exercise per Week: 3 days    Minutes of Exercise per Session: 30 min  Stress: No Stress Concern Present (05/24/2022)   [place]    Feeling of Stress : Not at all  Social Connections: Moderately Integrated (05/24/2022)   Social Connection and Isolation Panel [NHANES]    Frequency of  Communication with Friends and Family: Three times a week    Frequency of Social Gatherings with Friends and Family: Once a week    Attends Religious Services: More than 4 times per year    Active Member of [employer] or Organizations: Yes    Attends Archivist Meetings: More than 4 times per year    Marital Status: Widowed    Tobacco Counseling Counseling given: Not Answered Tobacco comments: smoked about 2 packs per  day, smoked for about 20 years; Quit in 1980   Clinical Intake:  Pre-visit preparation completed: Yes  Pain : No/denies pain     BMI - recorded: 23.18 Nutritional Risks: None Diabetes: Yes CBG done?: No (FSBS per pt -143) Did pt. bring in CBG monitor from home?: No  How often do you need to have someone help you when you read instructions, pamphlets, or other written materials from your doctor or pharmacy?: 1 - Never  Diabetic? yes Nutrition Risk Assessment:  Has the patient had any N/V/D within the last 2 months?  No  Does the patient have any non-healing wounds?  No  Has the patient had any unintentional weight loss or weight gain?  No   Diabetes:  Is the patient diabetic?  Yes  If diabetic, was a CBG obtained today?  Yes  , 143 per pt Did the patient bring in their glucometer from home?  No  How often do you monitor your CBG's? Every morning.   Financial Strains and Diabetes Management:  Are you having any financial strains with the device, your supplies or your medication? No .  Does the patient want to be seen by Chronic Care Management for management of their diabetes?  No  Would the patient like to be referred to a Nutritionist or for Diabetic Management?  No   Diabetic Exams:  Diabetic Eye Exam: Completed 01/06/2022 Dr.Porfilo Diabetic Foot Exam: Completed 09/29/2016 Dr.Fisher   Interpreter Needed?: No  Information entered by :: C.Gertha Lichtenberg LPN   Activities of Daily Living    05/24/2022    8:47 AM 12/21/2021   10:59 AM  In your  present state of health, do you have any difficulty performing the following activities:  Hearing? 0 0  Vision? 0 0  Difficulty concentrating or making decisions? 0 0  Walking or climbing stairs? 0 0  Dressing or bathing? 0 0  Doing errands, shopping? 0 0  Preparing Food and eating ? N   Using the Toilet? N   In the past six months, have you accidently leaked urine? N   Do you have problems with loss of bowel control? N   Managing your Medications? N   Managing your Finances? N   Housekeeping or managing your Housekeeping? N     Patient Care Team: Birdie Sons, MD as PCP - General (Family Medicine) Murlean Iba, MD (Nephrology) Olean Ree, MD as Consulting Physician (General Surgery) Isaias Sakai, MD as Referring Physician (Ophthalmology) Corey Skains, MD as Consulting Physician (Cardiology)  Indicate any recent Medical Services you may have received from other than [facility] providers in the past year (date may be approximate).     Assessment:   This is a routine wellness examination for Ryli.  Hearing/Vision screen Hearing Screening - Comments:: No aids Vision Screening - Comments:: No glasses- Dr.Porfilio - last exam 01/06/2022  Dietary issues and exercise activities discussed: Current Exercise Habits: Home exercise routine, Type of exercise: walking;treadmill;Other - see comments Wellsite geologist), Time (Minutes): 30, Frequency (Times/Week): 7, Weekly Exercise (Minutes/Week): 210, Intensity: Mild   Goals Addressed             This Visit's Progress    Patient Stated       Try to walk more and eat the right foods.       Depression Screen    05/24/2022    8:37 AM 12/21/2021   10:59 AM 04/06/2021   10:06 AM 12/19/2019    1:32 PM 12/17/2018  1:21 PM 11/23/2018   10:47 AM 11/17/2017    8:35 AM  PHQ 2/9 Scores  PHQ - 2 Score 0 0 0 0 0 0 0  PHQ- 9 Score 0 0 0   0     Fall Risk    05/24/2022    8:46 AM 12/21/2021   10:58 AM 04/06/2021   10:05 AM 12/19/2019     1:31 PM 12/25/2018    4:18 PM  Fall Risk   Falls in the past year? 0 0 0 0 0  Number falls in past yr: 0 0 0 0   Injury with Fall? 0 0 0 0   Risk for fall due to : No Fall Risks  No Fall Risks    Follow up Falls prevention discussed;Falls evaluation completed Falls evaluation completed Falls evaluation completed      FALL RISK PREVENTION PERTAINING TO THE HOME:  Any stairs in or around the home? Yes  If so, are there any without handrails? Yes  Home free of loose throw rugs in walkways, pet beds, electrical cords, etc? Yes  Adequate lighting in your home to reduce risk of falls? Yes   ASSISTIVE DEVICES UTILIZED TO PREVENT FALLS:  Life alert? No  Use of a cane, walker or w/c? No  Grab bars in the bathroom? No  Shower chair or bench in shower? No  Elevated toilet seat or a handicapped toilet? Yes   Cognitive Function:        05/24/2022    9:08 AM 12/19/2019    1:33 PM 12/17/2018    1:27 PM 11/16/2016    2:58 PM  6CIT Screen  What Year? 0 points 0 points 0 points 0 points  What month? 0 points 0 points 0 points 0 points  What time? 0 points 0 points 0 points 0 points  Count back from 20 0 points 0 points 0 points 0 points  Months in reverse 0 points 0 points 0 points 0 points  Repeat phrase 0 points 0 points 0 points 0 points  Total Score 0 points 0 points 0 points 0 points    Immunizations Immunization History  Administered Date(s) Administered   Fluad Quad(high Dose 65+) 01/02/2019, 01/13/2020, 04/06/2021, 04/29/2022   Influenza, High Dose Seasonal PF 01/12/2015, 03/29/2016, 01/31/2017, 01/02/2018   Pneumococcal Conjugate-13 10/07/2013   Pneumococcal Polysaccharide-23 02/17/2004, 02/09/2011   Td 09/01/2006   Tdap 09/29/2016   Zoster, Live 04/09/2009    TDAP status: Up to date  Flu Vaccine status: Up to date  Pneumococcal vaccine status: Due, Education has been provided regarding the importance of this vaccine. Advised may receive this vaccine at local pharmacy or  Health Dept. Aware to provide a copy of the vaccination record if obtained from local pharmacy or Health Dept. Verbalized acceptance and understanding.  Covid-19 vaccine status: Declined, Education has been provided regarding the importance of this vaccine but patient still declined. Advised may receive this vaccine at local pharmacy or Health Dept.or vaccine clinic. Aware to provide a copy of the vaccination record if obtained from local pharmacy or Health Dept. Verbalized acceptance and understanding.  Qualifies for Shingles Vaccine? Yes   Zostavax completed No   Shingrix Completed?: Yes  Screening Tests Health Maintenance  Topic Date Due   COVID-19 Vaccine (1) Never done   Zoster Vaccines- Shingrix (1 of 2) Never done   HEMOGLOBIN A1C  10/28/2022   DEXA SCAN  12/01/2022   Diabetic kidney evaluation - Urine ACR  12/16/2022   OPHTHALMOLOGY  EXAM  01/07/2023   Diabetic kidney evaluation - eGFR measurement  04/30/2023   Medicare Annual Wellness (AWV)  05/25/2023   DTaP/Tdap/Td (3 - Td or Tdap) 09/30/2026   Pneumonia Vaccine 63+ Years old  Completed   INFLUENZA VACCINE  Completed   HPV VACCINES  Aged Out    Health Maintenance  Health Maintenance Due  Topic Date Due   COVID-19 Vaccine (1) Never done   Zoster Vaccines- Shingrix (1 of 2) Never done    Colorectal cancer screening: No longer required.   Mammogram status: No longer required due to age. Pt Declined.  Bone Density status: Ordered 05/24/2022. Pt provided with contact info and advised to call to schedule appt.  Lung Cancer Screening: (Low Dose CT Chest recommended if Age 76-80 years, 30 pack-year currently smoking OR have quit w/in 15years.) does not qualify.   Lung Cancer Screening Referral: no  Additional Screening:  Hepatitis C Screening: does not qualify; Completed no  Vision Screening: Recommended annual ophthalmology exams for early detection of glaucoma and other disorders of the eye. Is the patient up to  date with their annual eye exam?  Yes  Who is the provider or what is the name of the office in which the patient attends annual eye exams? Dr.Porfilo If pt is not established with a provider, would they like to be referred to a provider to establish care? No .   Dental Screening: Recommended annual dental exams for proper oral hygiene  Community Resource Referral / Chronic Care Management: CRR required this visit?  No   CCM required this visit?  No      Plan:     I have personally reviewed and noted the following in the patient's chart:   Medical and social history Use of alcohol, tobacco or illicit drugs  Current medications and supplements including opioid prescriptions. Patient is not currently taking opioid prescriptions. Functional ability and status Nutritional status Physical activity Advanced directives List of other physicians Hospitalizations, surgeries, and ER visits in previous 12 months Vitals Screenings to include cognitive, depression, and falls Referrals and appointments  In addition, I have reviewed and discussed with patient certain preventive protocols, quality metrics, and best practice recommendations. A written personalized care plan for preventive services as well as general preventive health recommendations were provided to patient.     Lebron Conners, LPN   07/18/3242   Nurse Notes: Pt requested dexascan, order placed.

## 2022-05-24 NOTE — Patient Instructions (Signed)
[name] , Thank you for taking time to come for your Medicare Wellness Visit. I appreciate your ongoing commitment to your health goals. Please review the following plan we discussed and let me know if I can assist you in the future.   These are the goals we discussed:  Goals      Exercise 3x per week (30 min per time)     Recommend increasing time walking on treadmill to 20-30 minutes at a time vs 10 minutes.      Patient Stated     Try to walk more and eat the right foods.        This is a list of the screening recommended for you and due dates:  Health Maintenance  Topic Date Due   COVID-19 Vaccine (1) Never done   Zoster (Shingles) Vaccine (1 of 2) Never done   Hemoglobin A1C  10/28/2022   DEXA scan (bone density measurement)  12/01/2022   Yearly kidney health urinalysis for diabetes  12/16/2022   Eye exam for diabetics  01/07/2023   Yearly kidney function blood test for diabetes  04/30/2023   Medicare Annual Wellness Visit  05/25/2023   DTaP/Tdap/Td vaccine (3 - Td or Tdap) 09/30/2026   Pneumonia Vaccine  Completed   Flu Shot  Completed   HPV Vaccine  Aged Out    Advanced directives: No   Conditions/risks identified: none  Next appointment: Follow up in one year for your annual wellness visit 05/29/2023 @ 8:30am Via telephone.   Preventive Care 76 Years and Older, Female Preventive care refers to lifestyle choices and visits with your health care provider that can promote health and wellness. What does preventive care include? A yearly physical exam. This is also called an annual well check. Dental exams once or twice a year. Routine eye exams. Ask your health care provider how often you should have your eyes checked. Personal lifestyle choices, including: Daily care of your teeth and gums. Regular physical activity. Eating a healthy diet. Avoiding tobacco and drug use. Limiting alcohol use. Practicing safe sex. Taking low-dose aspirin every day. Taking  vitamin and mineral supplements as recommended by your health care provider. What happens during an annual well check? The services and screenings done by your health care provider during your annual well check will depend on your age, overall health, lifestyle risk factors, and family history of disease. Counseling  Your health care provider may ask you questions about your: Alcohol use. Tobacco use. Drug use. Emotional well-being. Home and relationship well-being. Sexual activity. Eating habits. History of falls. Memory and ability to understand (cognition). Work and work Statistician. Reproductive health. Screening  You may have the following tests or measurements: Height, weight, and BMI. Blood pressure. Lipid and cholesterol levels. These may be checked every 5 years, or more frequently if you are over 8 years old. Skin check. Lung cancer screening. You may have this screening every year starting at age 61 if you have a 30-pack-year history of smoking and currently smoke or have quit within the past 15 years. Fecal occult blood test (FOBT) of the stool. You may have this test every year starting at age 50. Flexible sigmoidoscopy or colonoscopy. You may have a sigmoidoscopy every 5 years or a colonoscopy every 10 years starting at age 54. Hepatitis C blood test. Hepatitis B blood test. Sexually transmitted disease (STD) testing. Diabetes screening. This is done by checking your blood sugar (glucose) after you have not eaten for a while (fasting).  You may have this done every 1-3 years. Bone density scan. This is done to screen for osteoporosis. You may have this done starting at age 44. Mammogram. This may be done every 1-2 years. Talk to your health care provider about how often you should have regular mammograms. Talk with your health care provider about your test results, treatment options, and if necessary, the need for more tests. Vaccines  Your health care provider may  recommend certain vaccines, such as: Influenza vaccine. This is recommended every year. Tetanus, diphtheria, and acellular pertussis (Tdap, Td) vaccine. You may need a Td booster every 10 years. Zoster vaccine. You may need this after age 67. Pneumococcal 13-valent conjugate (PCV13) vaccine. One dose is recommended after age 37. Pneumococcal polysaccharide (PPSV23) vaccine. One dose is recommended after age 64. Talk to your health care provider about which screenings and vaccines you need and how often you need them. This information is not intended to replace advice given to you by your health care provider. Make sure you discuss any questions you have with your health care provider. Document Released: 05/01/2015 Document Revised: 12/23/2015 Document Reviewed: 02/03/2015 Elsevier Interactive Patient Education  2017 [employer] Prevention in the Home Falls can cause injuries. They can happen to people of all ages. There are many things you can do to make your home safe and to help prevent falls. What can I do on the outside of my home? Regularly fix the edges of walkways and driveways and fix any cracks. Remove anything that might make you trip as you walk through a door, such as a raised step or threshold. Trim any bushes or trees on the path to your home. Use bright outdoor lighting. Clear any walking paths of anything that might make someone trip, such as rocks or tools. Regularly check to see if handrails are loose or broken. Make sure that both sides of any steps have handrails. Any raised decks and porches should have guardrails on the edges. Have any leaves, snow, or ice cleared regularly. Use sand or salt on walking paths during winter. Clean up any spills in your garage right away. This includes oil or grease spills. What can I do in the bathroom? Use night lights. Install grab bars by the toilet and in the tub and shower. Do not use towel bars as grab bars. Use non-skid  mats or decals in the tub or shower. If you need to sit down in the shower, use a plastic, non-slip stool. Keep the floor dry. Clean up any water that spills on the floor as soon as it happens. Remove soap buildup in the tub or shower regularly. Attach bath mats securely with double-sided non-slip rug tape. Do not have throw rugs and other things on the floor that can make you trip. What can I do in the bedroom? Use night lights. Make sure that you have a light by your bed that is easy to reach. Do not use any sheets or blankets that are too big for your bed. They should not hang down onto the floor. Have a firm chair that has side arms. You can use this for support while you get dressed. Do not have throw rugs and other things on the floor that can make you trip. What can I do in the kitchen? Clean up any spills right away. Avoid walking on wet floors. Keep items that you use a lot in easy-to-reach places. If you need to reach something above you, use a  strong step stool that has a grab bar. Keep electrical cords out of the way. Do not use floor polish or wax that makes floors slippery. If you must use wax, use non-skid floor wax. Do not have throw rugs and other things on the floor that can make you trip. What can I do with my stairs? Do not leave any items on the stairs. Make sure that there are handrails on both sides of the stairs and use them. Fix handrails that are broken or loose. Make sure that handrails are as long as the stairways. Check any carpeting to make sure that it is firmly attached to the stairs. Fix any carpet that is loose or worn. Avoid having throw rugs at the top or bottom of the stairs. If you do have throw rugs, attach them to the floor with carpet tape. Make sure that you have a light switch at the top of the stairs and the bottom of the stairs. If you do not have them, ask someone to add them for you. What else can I do to help prevent falls? Wear shoes  that: Do not have high heels. Have rubber bottoms. Are comfortable and fit you well. Are closed at the toe. Do not wear sandals. If you use a stepladder: Make sure that it is fully opened. Do not climb a closed stepladder. Make sure that both sides of the stepladder are locked into place. Ask someone to hold it for you, if possible. Clearly mark and make sure that you can see: Any grab bars or handrails. First and last steps. Where the edge of each step is. Use tools that help you move around (mobility aids) if they are needed. These include: Canes. Walkers. Scooters. Crutches. Turn on the lights when you go into a dark area. Replace any light bulbs as soon as they burn out. Set up your furniture so you have a clear path. Avoid moving your furniture around. If any of your floors are uneven, fix them. If there are any pets around you, be aware of where they are. Review your medicines with your doctor. Some medicines can make you feel dizzy. This can increase your chance of falling. Ask your doctor what other things that you can do to help prevent falls. This information is not intended to replace advice given to you by your health care provider. Make sure you discuss any questions you have with your health care provider. Document Released: 01/29/2009 Document Revised: 09/10/2015 Document Reviewed: 05/09/2014 Elsevier Interactive Patient Education  2017 [employer].

## 2022-05-30 ENCOUNTER — Ambulatory Visit (INDEPENDENT_AMBULATORY_CARE_PROVIDER_SITE_OTHER): Payer: Medicare Other | Admitting: Family Medicine

## 2022-05-30 VITALS — BP 138/47 | HR 82 | Temp 99.5°F | Wt 153.0 lb

## 2022-05-30 DIAGNOSIS — U071 COVID-19: Secondary | ICD-10-CM | POA: Diagnosis not present

## 2022-05-30 DIAGNOSIS — J029 Acute pharyngitis, unspecified: Secondary | ICD-10-CM

## 2022-05-30 DIAGNOSIS — R051 Acute cough: Secondary | ICD-10-CM

## 2022-05-30 LAB — POC COVID19 BINAXNOW: SARS Coronavirus 2 Ag: POSITIVE — AB

## 2022-05-30 MED ORDER — NIRMATRELVIR/RITONAVIR (PAXLOVID) TABLET (RENAL DOSING): 2.0000 | ORAL_TABLET | Freq: Two times a day (BID) | ORAL | 0 refills | Status: AC

## 2022-05-30 NOTE — Progress Notes (Signed)
[place] Ponder [place],acting as a scribe for Lelon Huh, MD.,have documented all relevant documentation on the behalf of Lelon Huh, MD,as directed by  Lelon Huh, MD while in the presence of Lelon Huh, MD.     Established patient visit   Patient: Rachel Lester   DOB: June 19, 1940   82 y.o. Female  MRN: [id number] Visit Date: 05/30/2022  Today's healthcare provider: Lelon Huh, MD    Subjective    HPI  Patient is an 82 year old female with complaint of cold symtptoms.  She started having cough and chills.  She has not tested for Covid.  Her daughter had her taking some OTC flu and cold medication with no relief of symptoms. Patient reports that since being sick she notes her glucose has been elevated up to 180's with her usual being 130's. She states she hasn't been able to eat due to the sore throat.  Medications: Outpatient Medications Prior to Visit  Medication Sig   ACCU-CHEK AVIVA PLUS test strip CHECK BLOOD SUGAR THREE  TIMES DAILY   amLODipine (NORVASC) 2.5 MG tablet Take 1 tablet (2.5 mg total) by mouth daily.   b complex vitamins tablet Take 1 tablet by mouth daily.   Blood Glucose Monitoring Suppl (ACCU-CHEK GUIDE ME) w/Device KIT USE TO TEST BLOOD SUGAR  ONCE DAILY   Cholecalciferol (VITAMIN D3) 2000 UNITS capsule Take 2,000 Units by mouth daily.   clopidogrel (PLAVIX) 75 MG tablet TAKE 1 TABLET BY MOUTH  DAILY   Cyanocobalamin (VITAMIN B12) 500 MCG TABS Take by mouth.   FARXIGA 5 MG TABS tablet Take 5 mg by mouth daily.   Ferrous Sulfate (IRON) 325 (65 Fe) MG TABS Take by mouth.   glimepiride (AMARYL) 4 MG tablet TAKE 1 TABLET BY MOUTH IN THE  MORNING   losartan (COZAAR) 25 MG tablet Take 1 tablet by mouth daily.   Magnesium 500 MG TABS Take by mouth daily.   metFORMIN (GLUCOPHAGE) 500 MG tablet TAKE 1 TABLET BY MOUTH  DAILY WITH SUPPER   pioglitazone (ACTOS) 45 MG tablet TAKE 1 TABLET BY MOUTH DAILY   Probiotic Product (UP4 PROBIOTICS ADULT) CAPS Take  by mouth.   pyridOXINE (VITAMIN B-6) 100 MG tablet Take 100 mg by mouth daily.   rosuvastatin (CRESTOR) 20 MG tablet TAKE 1 TABLET BY MOUTH  DAILY   torsemide (DEMADEX) 20 MG tablet Take 20 mg by mouth daily as needed.    vitamin C (ASCORBIC ACID) 500 MG tablet Take 500 mg by mouth daily.   zinc gluconate 50 MG tablet Take 50 mg by mouth daily.   No facility-administered medications prior to visit.    Review of Systems  Constitutional:  Positive for chills, diaphoresis and fatigue. Negative for appetite change and fever.  HENT:  Positive for congestion, ear pain, postnasal drip, rhinorrhea, sneezing, sore throat, trouble swallowing and voice change. Negative for ear discharge, facial swelling, hearing loss, sinus pressure, sinus pain and tinnitus.   Eyes:  Negative for photophobia, pain, discharge, redness, itching and visual disturbance.  Respiratory:  Positive for cough. Negative for chest tightness, shortness of breath and wheezing.   Cardiovascular:  Negative for chest pain, palpitations and leg swelling.  Gastrointestinal:  Negative for abdominal pain, constipation, diarrhea, nausea and vomiting.  Musculoskeletal:  Positive for myalgias.  Neurological:  Negative for dizziness, light-headedness and headaches.      Objective    BP (!) 138/47 (BP Location: Right Arm, Patient Position: Sitting, Cuff Size: Normal)   Pulse  82   Temp 99.5 F (37.5 C) (Oral)   Wt 153 lb (69.4 kg)   SpO2 100%   BMI 23.96 kg/m  Vitals:   05/30/22 1320 05/30/22 1322  BP: (!) 130/47 (!) 138/47  Pulse: 82   Temp: 99.5 F (37.5 C)   TempSrc: Oral   SpO2: 100%   Weight: 153 lb (69.4 kg)     Physical Exam   General: Appearance:    Well developed, well nourished female in no acute distress  Eyes:    PERRL, conjunctiva/corneas clear, EOM's intact       Lungs:     Clear to auscultation bilaterally, respirations unlabored  Heart:    Normal heart rate. Normal rhythm. No murmurs, rubs, or gallops.     MS:   All extremities are intact.    Neurologic:   Awake, alert, oriented x 3. No apparent focal neurological defect.        Results for orders placed or performed in visit on 05/30/22  POC COVID-19  Result Value Ref Range   SARS Coronavirus 2 Ag Positive (A) Negative     Assessment & Plan     1. Acute cough   2. Sore throat   3. COVID-19  - nirmatrelvir/ritonavir, renal dosing, (PAXLOVID) 10 x 150 MG & 10 x 100MG TABS; Take 2 tablets by mouth 2 (two) times daily for 5 days. (Take nirmatrelvir 150 mg one tablet twice daily for 5 days and ritonavir 100 mg one tablet twice daily for 5 days) Patient GFR is 34  Dispense: 20 tablet; Refill: 0   Call if symptoms change or if not rapidly improving.        The entirety of the information documented in the History of Present Illness, Review of Systems and Physical Exam were personally obtained by me. Portions of this information were initially documented by the CMA and reviewed by me for thoroughness and accuracy.     Lelon Huh, MD  [place] [phone] (phone) [phone] (fax)  [place]

## 2022-06-09 DIAGNOSIS — I10 Essential (primary) hypertension: Secondary | ICD-10-CM | POA: Diagnosis not present

## 2022-06-09 DIAGNOSIS — N1832 Chronic kidney disease, stage 3b: Secondary | ICD-10-CM | POA: Diagnosis not present

## 2022-06-09 DIAGNOSIS — E1122 Type 2 diabetes mellitus with diabetic chronic kidney disease: Secondary | ICD-10-CM | POA: Diagnosis not present

## 2022-06-09 DIAGNOSIS — D631 Anemia in chronic kidney disease: Secondary | ICD-10-CM | POA: Diagnosis not present

## 2022-07-04 DIAGNOSIS — N1832 Chronic kidney disease, stage 3b: Secondary | ICD-10-CM | POA: Diagnosis not present

## 2022-07-04 DIAGNOSIS — Z87891 Personal history of nicotine dependence: Secondary | ICD-10-CM | POA: Diagnosis not present

## 2022-07-04 DIAGNOSIS — E785 Hyperlipidemia, unspecified: Secondary | ICD-10-CM | POA: Diagnosis not present

## 2022-07-04 DIAGNOSIS — Z7902 Long term (current) use of antithrombotics/antiplatelets: Secondary | ICD-10-CM | POA: Diagnosis not present

## 2022-07-04 DIAGNOSIS — I6523 Occlusion and stenosis of bilateral carotid arteries: Secondary | ICD-10-CM | POA: Diagnosis not present

## 2022-07-04 DIAGNOSIS — I10 Essential (primary) hypertension: Secondary | ICD-10-CM | POA: Diagnosis not present

## 2022-07-04 DIAGNOSIS — E119 Type 2 diabetes mellitus without complications: Secondary | ICD-10-CM | POA: Diagnosis not present

## 2022-07-04 DIAGNOSIS — N179 Acute kidney failure, unspecified: Secondary | ICD-10-CM | POA: Diagnosis not present

## 2022-07-08 DIAGNOSIS — Z961 Presence of intraocular lens: Secondary | ICD-10-CM | POA: Diagnosis not present

## 2022-07-08 DIAGNOSIS — E113311 Type 2 diabetes mellitus with moderate nonproliferative diabetic retinopathy with macular edema, right eye: Secondary | ICD-10-CM | POA: Diagnosis not present

## 2022-07-14 ENCOUNTER — Other Ambulatory Visit: Payer: Self-pay | Admitting: Family Medicine

## 2022-07-14 DIAGNOSIS — E1122 Type 2 diabetes mellitus with diabetic chronic kidney disease: Secondary | ICD-10-CM

## 2022-08-29 ENCOUNTER — Encounter: Payer: Self-pay | Admitting: Family Medicine

## 2022-08-29 ENCOUNTER — Ambulatory Visit (INDEPENDENT_AMBULATORY_CARE_PROVIDER_SITE_OTHER): Payer: Medicare Other | Admitting: Family Medicine

## 2022-08-29 VITALS — BP 156/62 | HR 66 | Wt 150.5 lb

## 2022-08-29 DIAGNOSIS — I10 Essential (primary) hypertension: Secondary | ICD-10-CM | POA: Diagnosis not present

## 2022-08-29 DIAGNOSIS — N1832 Chronic kidney disease, stage 3b: Secondary | ICD-10-CM | POA: Diagnosis not present

## 2022-08-29 DIAGNOSIS — E1122 Type 2 diabetes mellitus with diabetic chronic kidney disease: Secondary | ICD-10-CM | POA: Diagnosis not present

## 2022-08-29 LAB — POCT GLYCOSYLATED HEMOGLOBIN (HGB A1C)
Est. average glucose Bld gHb Est-mCnc: 137
Hemoglobin A1C: 6.4 % — AB (ref 4.0–5.6)

## 2022-08-29 NOTE — Patient Instructions (Signed)
.   Please review the attached list of medications and notify my office if there are any errors.   . Please bring all of your medications to every appointment so we can make sure that our medication list is the same as yours.   

## 2022-08-29 NOTE — Progress Notes (Signed)
I,[name],acting as a Neurosurgeon for Rachel Merry, MD.,have documented all relevant documentation on the behalf of Rachel Merry, MD,as directed by  Rachel Merry, MD   Established patient visit   Patient: Rachel Lester   DOB: Apr 18, 1941   82 y.o. Female  MRN: [id number] Visit Date: 08/29/2022  Today's healthcare provider: [name], MD   Chief Complaint  Patient presents with   Diabetes   Subjective    HPI  Diabetes Mellitus Type II, follow-up  Lab Results  Component Value Date   HGBA1C 6.9 (A) 04/29/2022   HGBA1C 6.3 (A) 12/21/2021   HGBA1C 6.3 (A) 08/23/2021   Last seen for diabetes 4 months ago.  Management since then includes continuing the same treatment.  Home blood sugar records:  130-160  Episodes of hypoglycemia? No    Current insulin regiment: n/a Most Recent Eye Exam: 01/06/22 --------------------------------------------------------------------------------------------------- Hypertension, follow-up  BP Readings from Last 3 Encounters:  05/30/22 (!) 138/47  04/29/22 (!) 139/54  12/21/21 (!) 118/56   Wt Readings from Last 3 Encounters:  05/30/22 153 lb (69.4 kg)  05/24/22 148 lb (67.1 kg)  04/29/22 148 lb 6.4 oz (67.3 kg)     She was last seen for hypertension 4 months ago.  BP at that visit was 139/54. Management since that visit includes only taking 1/2 tablet losartan which is prescribed by her nephrologist, but is supposed to be taking a full tablet. If electrolytes are normal will go back up to 1 full tablet daily. .  Outside blood pressures are not being checked. --------------------------------------------------------------------------------------------------- Lipid/Cholesterol, follow-up  Last Lipid Panel: Lab Results  Component Value Date   CHOL 167 04/29/2022   LDLCALC 81 04/29/2022   HDL 69 04/29/2022   TRIG 94 04/29/2022    She was last seen for this 4 months ago.  Management since that visit includes continue  statin.  Symptoms: No appetite changes No foot ulcerations  No chest pain No chest pressure/discomfort  No dyspnea No orthopnea  No fatigue No lower extremity edema  No palpitations No paroxysmal nocturnal dyspnea  No nausea No numbness or tingling of extremity  No polydipsia No polyuria  No speech difficulty No syncope   Last metabolic panel Lab Results  Component Value Date   GLUCOSE 158 (H) 04/29/2022   NA 140 04/29/2022   K 5.1 04/29/2022   BUN 61 (H) 04/29/2022   CREATININE 1.57 (H) 04/29/2022   EGFR 33 (L) 04/29/2022   GFRNONAA 36 03/16/2021   CALCIUM 9.2 04/29/2022   AST 21 03/19/2019   ALT 20 03/19/2019   The ASCVD Risk score (Arnett DK, et al., 2019) failed to calculate for the following reasons:   The 2019 ASCVD risk score is only valid for ages 58 to 68   The patient has a prior MI or stroke diagnosis ---------------------------------------------------------------------------------------------------  Medications: Outpatient Medications Prior to Visit  Medication Sig   ACCU-CHEK AVIVA PLUS test strip CHECK BLOOD SUGAR THREE  TIMES DAILY   amLODipine (NORVASC) 2.5 MG tablet Take 1 tablet (2.5 mg total) by mouth daily.   b complex vitamins tablet Take 1 tablet by mouth daily.   Blood Glucose Monitoring Suppl (ACCU-CHEK GUIDE ME) w/Device KIT USE TO TEST BLOOD SUGAR  ONCE DAILY   Cholecalciferol (VITAMIN D3) 2000 UNITS capsule Take 2,000 Units by mouth daily.   clopidogrel (PLAVIX) 75 MG tablet TAKE 1 TABLET BY MOUTH  DAILY   Cyanocobalamin (VITAMIN B12) 500 MCG TABS Take by mouth.  FARXIGA 5 MG TABS tablet Take 5 mg by mouth daily.   Ferrous Sulfate (IRON) 325 (65 Fe) MG TABS Take by mouth.   glimepiride (AMARYL) 4 MG tablet TAKE 1 TABLET BY MOUTH IN THE  MORNING   losartan (COZAAR) 25 MG tablet Take 1 tablet by mouth daily.   Magnesium 500 MG TABS Take by mouth daily.   metFORMIN (GLUCOPHAGE) 500 MG tablet TAKE 1 TABLET BY MOUTH DAILY  WITH SUPPER    pioglitazone (ACTOS) 45 MG tablet TAKE 1 TABLET BY MOUTH DAILY   Probiotic Product (UP4 PROBIOTICS ADULT) CAPS Take by mouth.   pyridOXINE (VITAMIN B-6) 100 MG tablet Take 100 mg by mouth daily.   rosuvastatin (CRESTOR) 20 MG tablet TAKE 1 TABLET BY MOUTH  DAILY   torsemide (DEMADEX) 20 MG tablet Take 20 mg by mouth daily as needed.    vitamin C (ASCORBIC ACID) 500 MG tablet Take 500 mg by mouth daily.   zinc gluconate 50 MG tablet Take 50 mg by mouth daily.   No facility-administered medications prior to visit.    Review of Systems  Constitutional:  Negative for appetite change, chills, fatigue and fever.  Respiratory:  Negative for chest tightness and shortness of breath.   Cardiovascular:  Negative for chest pain and palpitations.  Gastrointestinal:  Negative for abdominal pain, nausea and vomiting.  Neurological:  Negative for dizziness and weakness.      Objective    BP (!) 156/62 (BP Location: Right Arm, Patient Position: Sitting, Cuff Size: Normal)   Pulse 66   Wt 150 lb 8 oz (68.3 kg)   BMI 23.57 kg/m    Physical Exam   General: Appearance:    Well developed, well nourished female in no acute distress  Eyes:    PERRL, conjunctiva/corneas clear, EOM's intact       Lungs:     Clear to auscultation bilaterally, respirations unlabored  Heart:    Normal heart rate. Normal rhythm. No murmurs, rubs, or gallops.    MS:   All extremities are intact.    Neurologic:   Awake, alert, oriented x 3. No apparent focal neurological defect.       Results for orders placed or performed in visit on 08/29/22  POCT HgB A1C  Result Value Ref Range   Hemoglobin A1C 6.4 (A) 4.0 - 5.6 %   Est. average glucose Bld gHb Est-mCnc 137     Assessment & Plan     1. Type 2 diabetes mellitus with stage 3b chronic kidney disease, without long-term current use of insulin (HCC) A1c well controlled. On Farxiga prescribed by Dr. Thedore Mins for CKD. Continue current medications.  Consider reducing  glimepiride if A1c continues to drop.   Future Appointments  Date Time Provider Department Center  12/21/2022 10:40 AM Malva Limes, MD [facility]  05/29/2023  8:30 AM [facility]ANNUAL WELLNESS VISIT [facility]     2. Primary hypertension Continue current medications.        The entirety of the information documented in the History of Present Illness, Review of Systems and Physical Exam were personally obtained by me. Portions of this information were initially documented by the CMA and reviewed by me for thoroughness and accuracy.     Rachel Merry, MD  [facility] [phone] (phone) [phone] (fax)  [facility]

## 2022-08-30 ENCOUNTER — Telehealth: Payer: Self-pay

## 2022-08-30 NOTE — Telephone Encounter (Signed)
Copied from CRM [id number]. Topic: General - Other >> Aug 30, 2022  9:35 AM Franchot Heidelberg wrote: Reason for CRM: Pt called to report that her insurance is going to contact her PCP for a Prior Auth for Farxiga

## 2022-08-31 ENCOUNTER — Other Ambulatory Visit: Payer: Self-pay | Admitting: Family Medicine

## 2022-08-31 MED ORDER — FARXIGA 5 MG PO TABS: 5.0000 mg | ORAL_TABLET | Freq: Every day | ORAL | 2 refills | Status: DC

## 2022-08-31 NOTE — Progress Notes (Signed)
Faxed refill request from [facility] for 90 days Rachel Lester, which was originally written by Dr. Thedore Mins

## 2022-09-15 ENCOUNTER — Other Ambulatory Visit: Payer: Self-pay | Admitting: Family Medicine

## 2022-09-15 DIAGNOSIS — I10 Essential (primary) hypertension: Secondary | ICD-10-CM

## 2022-10-05 DIAGNOSIS — I10 Essential (primary) hypertension: Secondary | ICD-10-CM | POA: Diagnosis not present

## 2022-10-05 DIAGNOSIS — E1122 Type 2 diabetes mellitus with diabetic chronic kidney disease: Secondary | ICD-10-CM | POA: Diagnosis not present

## 2022-10-05 DIAGNOSIS — D631 Anemia in chronic kidney disease: Secondary | ICD-10-CM | POA: Diagnosis not present

## 2022-10-05 DIAGNOSIS — N1832 Chronic kidney disease, stage 3b: Secondary | ICD-10-CM | POA: Diagnosis not present

## 2022-10-10 DIAGNOSIS — I10 Essential (primary) hypertension: Secondary | ICD-10-CM | POA: Diagnosis not present

## 2022-10-10 DIAGNOSIS — D631 Anemia in chronic kidney disease: Secondary | ICD-10-CM | POA: Diagnosis not present

## 2022-10-10 DIAGNOSIS — N1832 Chronic kidney disease, stage 3b: Secondary | ICD-10-CM | POA: Diagnosis not present

## 2022-10-10 DIAGNOSIS — N2581 Secondary hyperparathyroidism of renal origin: Secondary | ICD-10-CM | POA: Diagnosis not present

## 2022-10-10 DIAGNOSIS — E1122 Type 2 diabetes mellitus with diabetic chronic kidney disease: Secondary | ICD-10-CM | POA: Diagnosis not present

## 2022-10-19 DIAGNOSIS — D631 Anemia in chronic kidney disease: Secondary | ICD-10-CM | POA: Diagnosis not present

## 2022-10-19 DIAGNOSIS — E1122 Type 2 diabetes mellitus with diabetic chronic kidney disease: Secondary | ICD-10-CM | POA: Diagnosis not present

## 2022-10-19 DIAGNOSIS — N1832 Chronic kidney disease, stage 3b: Secondary | ICD-10-CM | POA: Diagnosis not present

## 2022-10-19 DIAGNOSIS — N2581 Secondary hyperparathyroidism of renal origin: Secondary | ICD-10-CM | POA: Diagnosis not present

## 2022-11-01 ENCOUNTER — Other Ambulatory Visit: Payer: Self-pay | Admitting: Family Medicine

## 2022-11-01 DIAGNOSIS — E113311 Type 2 diabetes mellitus with moderate nonproliferative diabetic retinopathy with macular edema, right eye: Secondary | ICD-10-CM

## 2022-12-20 ENCOUNTER — Telehealth: Payer: Self-pay | Admitting: Family Medicine

## 2022-12-21 ENCOUNTER — Encounter: Payer: Self-pay | Admitting: Physician Assistant

## 2022-12-21 ENCOUNTER — Other Ambulatory Visit: Payer: Self-pay | Admitting: Family Medicine

## 2022-12-21 ENCOUNTER — Ambulatory Visit: Payer: Medicare Other | Admitting: Family Medicine

## 2022-12-21 ENCOUNTER — Ambulatory Visit (INDEPENDENT_AMBULATORY_CARE_PROVIDER_SITE_OTHER): Payer: Medicare Other | Admitting: Physician Assistant

## 2022-12-21 VITALS — BP 149/47 | HR 69 | Ht 67.0 in | Wt 152.3 lb

## 2022-12-21 DIAGNOSIS — N1832 Chronic kidney disease, stage 3b: Secondary | ICD-10-CM | POA: Diagnosis not present

## 2022-12-21 DIAGNOSIS — E2839 Other primary ovarian failure: Secondary | ICD-10-CM

## 2022-12-21 DIAGNOSIS — E1122 Type 2 diabetes mellitus with diabetic chronic kidney disease: Secondary | ICD-10-CM | POA: Diagnosis not present

## 2022-12-21 DIAGNOSIS — Z7984 Long term (current) use of oral hypoglycemic drugs: Secondary | ICD-10-CM | POA: Diagnosis not present

## 2022-12-21 DIAGNOSIS — I10 Essential (primary) hypertension: Secondary | ICD-10-CM

## 2022-12-21 DIAGNOSIS — Z23 Encounter for immunization: Secondary | ICD-10-CM | POA: Diagnosis not present

## 2022-12-21 NOTE — Progress Notes (Signed)
Established patient visit  Patient: Rachel Lester   DOB: 09-03-1940   82 y.o. Female  MRN: [id number] Visit Date: 12/21/2022  Today's healthcare provider: Debera Lat, PA-C   Chief Complaint  Patient presents with   Medical Management of Chronic Issues    16 week follow up for diabetes, no concerns today    Subjective     Discussed the use of AI scribe software for clinical note transcription with the patient, who gave verbal consent to proceed.  History of Present Illness   The patient, with a longstanding history of diabetes, presents for a regular checkup. They report home blood sugar levels ranging from 120 to 150. The patient admits to increased consumption of sweets over the past three months due to various celebrations. The patient is on multiple medications for diabetes management, including Farxiga, Metformin, Amaryl, and Actos.  The patient's blood pressure is noted to be high during the visit, which is unusual as their home readings are typically in the 120s or lower.  The patient is on Amlodipine and Losartan for it.  The patient is also on iron supplements for anemia.           05/24/2022    8:37 AM 12/21/2021   10:59 AM 04/06/2021   10:06 AM  Depression screen PHQ 2/9  Decreased Interest 0 0 0  Down, Depressed, Hopeless 0 0 0  PHQ - 2 Score 0 0 0  Altered sleeping 0 0 0  Tired, decreased energy 0 0 0  Change in appetite 0 0 0  Feeling bad or failure about yourself  0 0 0  Trouble concentrating 0 0 0  Moving slowly or fidgety/restless 0 0 0  Suicidal thoughts 0 0 0  PHQ-9 Score 0 0 0  Difficult doing work/chores Not difficult at all Not difficult at all Not difficult at all     Medications: Outpatient Medications Prior to Visit  Medication Sig   ACCU-CHEK AVIVA PLUS test strip CHECK BLOOD SUGAR THREE  TIMES DAILY   amLODipine (NORVASC) 2.5 MG tablet TAKE 1 TABLET BY MOUTH DAILY   b complex vitamins tablet Take 1 tablet by mouth daily.   Blood Glucose  Monitoring Suppl (ACCU-CHEK GUIDE ME) w/Device KIT USE TO TEST BLOOD SUGAR  ONCE DAILY   Cholecalciferol (VITAMIN D3) 2000 UNITS capsule Take 2,000 Units by mouth daily.   Cyanocobalamin (VITAMIN B12) 500 MCG TABS Take by mouth.   FARXIGA 5 MG TABS tablet Take 1 tablet (5 mg total) by mouth daily.   Ferrous Sulfate (IRON) 325 (65 Fe) MG TABS Take by mouth.   glimepiride (AMARYL) 4 MG tablet TAKE 1 TABLET BY MOUTH IN THE  MORNING   losartan (COZAAR) 25 MG tablet Take 1 tablet by mouth daily.   Magnesium 500 MG TABS Take by mouth daily.   metFORMIN (GLUCOPHAGE) 500 MG tablet TAKE 1 TABLET BY MOUTH DAILY  WITH SUPPER   pioglitazone (ACTOS) 45 MG tablet TAKE 1 TABLET BY MOUTH DAILY   Probiotic Product (UP4 PROBIOTICS ADULT) CAPS Take by mouth.   pyridOXINE (VITAMIN B-6) 100 MG tablet Take 100 mg by mouth daily.   rosuvastatin (CRESTOR) 20 MG tablet TAKE 1 TABLET BY MOUTH  DAILY   torsemide (DEMADEX) 20 MG tablet Take 20 mg by mouth daily as needed.    vitamin C (ASCORBIC ACID) 500 MG tablet Take 500 mg by mouth daily.   zinc gluconate 50 MG tablet Take 50 mg by mouth daily.   [DISCONTINUED]  clopidogrel (PLAVIX) 75 MG tablet TAKE 1 TABLET BY MOUTH  DAILY   No facility-administered medications prior to visit.    Review of Systems  All other systems reviewed and are negative.  Except see HPI       Objective    BP (!) 149/47 (BP Location: Left Arm, Patient Position: Sitting, Cuff Size: Normal)   Pulse 69   Ht 5\' 7"  (1.702 m)   Wt 152 lb 4.8 oz (69.1 kg)   SpO2 100%   BMI 23.85 kg/m     Physical Exam Vitals reviewed.  Constitutional:      General: She is not in acute distress.    Appearance: Normal appearance. She is well-developed. She is not diaphoretic.  HENT:     Head: Normocephalic and atraumatic.  Eyes:     General: No scleral icterus.    Conjunctiva/sclera: Conjunctivae normal.  Neck:     Thyroid: No thyromegaly.  Cardiovascular:     Rate and Rhythm: Normal rate and  regular rhythm.     Pulses: Normal pulses.     Heart sounds: Normal heart sounds. No murmur heard. Pulmonary:     Effort: Pulmonary effort is normal. No respiratory distress.     Breath sounds: Normal breath sounds. No wheezing, rhonchi or rales.  Musculoskeletal:     Cervical back: Neck supple.     Right lower leg: No edema.     Left lower leg: No edema.  Lymphadenopathy:     Cervical: No cervical adenopathy.  Skin:    General: Skin is warm and dry.     Findings: No rash.  Neurological:     Mental Status: She is alert and oriented to person, place, and time. Mental status is at baseline.  Psychiatric:        Mood and Affect: Mood normal.        Behavior: Behavior normal.      Results for orders placed or performed in visit on 12/21/22  Microalbumin / creatinine urine ratio  Result Value Ref Range   Creatinine, Urine 37.0 Not Estab. mg/dL   Microalbumin, Urine 914.7 Not Estab. ug/mL   Microalb/Creat Ratio 328 (H) 0 - 29 mg/g creat  CBC with Differential/Platelet  Result Value Ref Range   WBC 5.7 3.4 - 10.8 x10E3/uL   RBC 3.15 (L) 3.77 - 5.28 x10E6/uL   Hemoglobin 9.4 (L) 11.1 - 15.9 g/dL   Hematocrit 82.9 (L) 56.2 - 46.6 %   MCV 95 79 - 97 fL   MCH 29.8 26.6 - 33.0 pg   MCHC 31.5 31.5 - 35.7 g/dL   RDW 13.0 86.5 - 78.4 %   Platelets 229 150 - 450 x10E3/uL   Neutrophils 69 Not Estab. %   Lymphs 23 Not Estab. %   Monocytes 7 Not Estab. %   Eos 1 Not Estab. %   Basos 0 Not Estab. %   Neutrophils Absolute 3.8 1.4 - 7.0 x10E3/uL   Lymphocytes Absolute 1.3 0.7 - 3.1 x10E3/uL   Monocytes Absolute 0.4 0.1 - 0.9 x10E3/uL   EOS (ABSOLUTE) 0.1 0.0 - 0.4 x10E3/uL   Basophils Absolute 0.0 0.0 - 0.2 x10E3/uL   Immature Granulocytes 0 Not Estab. %   Immature Grans (Abs) 0.0 0.0 - 0.1 x10E3/uL  Basic metabolic panel  Result Value Ref Range   Glucose 109 (H) 70 - 99 mg/dL   BUN 40 (H) 8 - 27 mg/dL   Creatinine, Ser 6.96 (H) 0.57 - 1.00 mg/dL   eGFR 31 (  L) >59 mL/min/1.73    BUN/Creatinine Ratio 24 12 - 28   Sodium 140 134 - 144 mmol/L   Potassium 5.6 (H) 3.5 - 5.2 mmol/L   Chloride 107 (H) 96 - 106 mmol/L   CO2 21 20 - 29 mmol/L   Calcium 9.1 8.7 - 10.3 mg/dL  Lipid panel  Result Value Ref Range   Cholesterol, Total 123 100 - 199 mg/dL   Triglycerides 69 0 - 149 mg/dL   HDL 60 >29 mg/dL   VLDL Cholesterol Cal 14 5 - 40 mg/dL   LDL Chol Calc (NIH) 49 0 - 99 mg/dL   Chol/HDL Ratio 2.1 0.0 - 4.4 ratio  POCT HgB A1C  Result Value Ref Range   Hemoglobin A1C 8.2 (A) 4.0 - 5.6 %   HbA1c POC (<> result, manual entry)     HbA1c, POC (prediabetic range)     HbA1c, POC (controlled diabetic range)      Assessment & Plan        Type 2 Diabetes Mellitus Chronic and previously stable Elevated HbA1c (8.2) from 6.7, despite adherence to current medications (Farxiga, Metformin, Amaryl, Actos). Recent increase in consumption of sweets due to social events. -Continue current medications. -Advise to reduce intake of sweets and monitor blood glucose levels daily. -Record blood glucose readings and bring to next appointment with Dr. Sherrie Mustache.  Hypertension Chronic Elevated blood pressure readings in the clinic, but patient reports lower readings at home. -Continue current antihypertensive medications (Amlodipine, Losartan). -Advise to monitor blood pressure at home and record readings. -Consider medication adjustment if blood pressure remains high.  Osteoporosis DEXA scan overdue, last performed before COVID-19 pandemic. -Order DEXA scan.  Anemia Patient on iron supplementation, but anemia noted. -Order blood work to assess anemia.  General Health Maintenance -Administer influenza vaccine today. -Schedule follow-up appointment in three months for chronic condition management, including blood pressure.     Return in about 3 months (around 03/22/2023) for chronic disease f/u, BP f/u.     The patient was advised to call back or seek an in-person evaluation if  the symptoms worsen or if the condition fails to improve as anticipated.  I discussed the assessment and treatment plan with the patient. The patient was provided an opportunity to ask questions and all were answered. The patient agreed with the plan and demonstrated an understanding of the instructions.  I, Debera Lat, PA-C have reviewed all documentation for this visit. The documentation on  12/21/22 for the exam, diagnosis, procedures, and orders are all accurate and complete.  Debera Lat, [facility], MMS [facility] [phone] (phone) [phone] (fax)  [facility]

## 2022-12-22 LAB — CBC WITH DIFFERENTIAL/PLATELET
Basophils Absolute: 0 10*3/uL (ref 0.0–0.2)
Basos: 0 %
EOS (ABSOLUTE): 0.1 10*3/uL (ref 0.0–0.4)
Eos: 1 %
Hematocrit: 29.8 % — ABNORMAL LOW (ref 34.0–46.6)
Hemoglobin: 9.4 g/dL — ABNORMAL LOW (ref 11.1–15.9)
Immature Grans (Abs): 0 10*3/uL (ref 0.0–0.1)
Immature Granulocytes: 0 %
Lymphocytes Absolute: 1.3 10*3/uL (ref 0.7–3.1)
Lymphs: 23 %
MCH: 29.8 pg (ref 26.6–33.0)
MCHC: 31.5 g/dL (ref 31.5–35.7)
MCV: 95 fL (ref 79–97)
Monocytes Absolute: 0.4 10*3/uL (ref 0.1–0.9)
Monocytes: 7 %
Neutrophils Absolute: 3.8 10*3/uL (ref 1.4–7.0)
Neutrophils: 69 %
Platelets: 229 10*3/uL (ref 150–450)
RBC: 3.15 x10E6/uL — ABNORMAL LOW (ref 3.77–5.28)
RDW: 13 % (ref 11.7–15.4)
WBC: 5.7 10*3/uL (ref 3.4–10.8)

## 2022-12-22 LAB — BASIC METABOLIC PANEL
BUN/Creatinine Ratio: 24 (ref 12–28)
BUN: 40 mg/dL — ABNORMAL HIGH (ref 8–27)
CO2: 21 mmol/L (ref 20–29)
Calcium: 9.1 mg/dL (ref 8.7–10.3)
Chloride: 107 mmol/L — ABNORMAL HIGH (ref 96–106)
Creatinine, Ser: 1.64 mg/dL — ABNORMAL HIGH (ref 0.57–1.00)
Glucose: 109 mg/dL — ABNORMAL HIGH (ref 70–99)
Potassium: 5.6 mmol/L — ABNORMAL HIGH (ref 3.5–5.2)
Sodium: 140 mmol/L (ref 134–144)
eGFR: 31 mL/min/{1.73_m2} — ABNORMAL LOW (ref >59)

## 2022-12-22 LAB — MICROALBUMIN / CREATININE URINE RATIO
Creatinine, Urine: 37 mg/dL
Microalb/Creat Ratio: 328 mg/g{creat} — ABNORMAL HIGH (ref 0–29)
Microalbumin, Urine: 121.5 ug/mL

## 2022-12-22 LAB — LIPID PANEL
Chol/HDL Ratio: 2.1 ratio (ref 0.0–4.4)
Cholesterol, Total: 123 mg/dL (ref 100–199)
HDL: 60 mg/dL (ref >39)
LDL Chol Calc (NIH): 49 mg/dL (ref 0–99)
Triglycerides: 69 mg/dL (ref 0–149)
VLDL Cholesterol Cal: 14 mg/dL (ref 5–40)

## 2022-12-22 LAB — POCT GLYCOSYLATED HEMOGLOBIN (HGB A1C): Hemoglobin A1C: 8.2 % — AB (ref 4.0–5.6)

## 2022-12-22 NOTE — Progress Notes (Signed)
Spoke with pt and discuss her lab results. Advised a refer to hematology if hemoglobin will continue to drop or she will become symptomatic. She will see nephrology for her low GFR/

## 2022-12-22 NOTE — Telephone Encounter (Signed)
Requested Prescriptions  Pending Prescriptions Disp Refills   clopidogrel (PLAVIX) 75 MG tablet [Pharmacy Med Name: Clopidogrel Bisulfate 75 MG Oral Tablet] 100 tablet 0    Sig: TAKE 1 TABLET BY MOUTH DAILY     Hematology: Antiplatelets - clopidogrel Failed - 12/21/2022 11:06 PM      Failed - HCT in normal range and within 180 days    Hematocrit  Date Value Ref Range Status  12/21/2022 29.8 (L) 34.0 - 46.6 % Final         Failed - HGB in normal range and within 180 days    Hemoglobin  Date Value Ref Range Status  12/21/2022 9.4 (L) 11.1 - 15.9 g/dL Final         Failed - PLT in normal range and within 180 days    Platelets  Date Value Ref Range Status  12/21/2022 229 150 - 450 x10E3/uL Final         Failed - Cr in normal range and within 360 days    Creatinine  Date Value Ref Range Status  05/12/2013 1.23 0.60 - 1.30 mg/dL Final   Creatinine, Ser  Date Value Ref Range Status  12/21/2022 1.64 (H) 0.57 - 1.00 mg/dL Final   Creatinine, POC  Date Value Ref Range Status  01/31/2017 n/a mg/dL Final         Passed - Valid encounter within last 6 months    Recent Outpatient Visits           Yesterday Type 2 diabetes mellitus with stage 3b chronic kidney disease, without long-term current use of insulin (HCC)   [place] [facility] [place], [place], PA-C   3 months ago Type 2 diabetes mellitus with stage 3b chronic kidney disease, without long-term current use of insulin (HCC)   [place] [facility] Malva Limes, MD   6 months ago Acute cough   [facility] Malva Limes, MD   7 months ago Type 2 diabetes mellitus with stage 3b chronic kidney disease, without long-term current use of insulin (HCC)   [place] [facility] Malva Limes, MD   1 year ago Type 2 diabetes mellitus with stage 3b chronic kidney disease, without long-term current use of insulin (HCC)    [facility] Malva Limes, MD       Future Appointments             In 3 months Fisher, Demetrios Isaacs, MD [facility], [facility]

## 2023-01-03 DIAGNOSIS — M8588 Other specified disorders of bone density and structure, other site: Secondary | ICD-10-CM | POA: Diagnosis not present

## 2023-01-03 DIAGNOSIS — M85852 Other specified disorders of bone density and structure, left thigh: Secondary | ICD-10-CM | POA: Diagnosis not present

## 2023-01-03 LAB — HM DEXA SCAN

## 2023-01-12 DIAGNOSIS — Z961 Presence of intraocular lens: Secondary | ICD-10-CM | POA: Diagnosis not present

## 2023-01-12 DIAGNOSIS — H43813 Vitreous degeneration, bilateral: Secondary | ICD-10-CM | POA: Diagnosis not present

## 2023-01-12 DIAGNOSIS — E113293 Type 2 diabetes mellitus with mild nonproliferative diabetic retinopathy without macular edema, bilateral: Secondary | ICD-10-CM | POA: Diagnosis not present

## 2023-01-12 DIAGNOSIS — E113311 Type 2 diabetes mellitus with moderate nonproliferative diabetic retinopathy with macular edema, right eye: Secondary | ICD-10-CM | POA: Diagnosis not present

## 2023-01-12 LAB — HM DIABETES EYE EXAM

## 2023-01-13 ENCOUNTER — Encounter: Payer: Self-pay | Admitting: Family Medicine

## 2023-01-13 DIAGNOSIS — M85852 Other specified disorders of bone density and structure, left thigh: Secondary | ICD-10-CM | POA: Insufficient documentation

## 2023-01-26 ENCOUNTER — Encounter: Payer: Self-pay | Admitting: Family Medicine

## 2023-02-09 DIAGNOSIS — D631 Anemia in chronic kidney disease: Secondary | ICD-10-CM | POA: Diagnosis not present

## 2023-02-09 DIAGNOSIS — N1832 Chronic kidney disease, stage 3b: Secondary | ICD-10-CM | POA: Diagnosis not present

## 2023-02-09 DIAGNOSIS — N2581 Secondary hyperparathyroidism of renal origin: Secondary | ICD-10-CM | POA: Diagnosis not present

## 2023-02-09 DIAGNOSIS — E1122 Type 2 diabetes mellitus with diabetic chronic kidney disease: Secondary | ICD-10-CM | POA: Diagnosis not present

## 2023-02-10 ENCOUNTER — Other Ambulatory Visit: Payer: Self-pay | Admitting: Family Medicine

## 2023-02-13 DIAGNOSIS — R809 Proteinuria, unspecified: Secondary | ICD-10-CM | POA: Diagnosis not present

## 2023-02-13 DIAGNOSIS — N1832 Chronic kidney disease, stage 3b: Secondary | ICD-10-CM | POA: Diagnosis not present

## 2023-02-13 DIAGNOSIS — I10 Essential (primary) hypertension: Secondary | ICD-10-CM | POA: Diagnosis not present

## 2023-02-13 DIAGNOSIS — E1122 Type 2 diabetes mellitus with diabetic chronic kidney disease: Secondary | ICD-10-CM | POA: Diagnosis not present

## 2023-02-13 DIAGNOSIS — D631 Anemia in chronic kidney disease: Secondary | ICD-10-CM | POA: Diagnosis not present

## 2023-02-13 DIAGNOSIS — N2581 Secondary hyperparathyroidism of renal origin: Secondary | ICD-10-CM | POA: Diagnosis not present

## 2023-02-13 DIAGNOSIS — E1129 Type 2 diabetes mellitus with other diabetic kidney complication: Secondary | ICD-10-CM | POA: Diagnosis not present

## 2023-03-02 ENCOUNTER — Other Ambulatory Visit: Payer: Self-pay | Admitting: Family Medicine

## 2023-03-02 NOTE — Telephone Encounter (Signed)
Requested medications are due for refill today.  yes  Requested medications are on the active medications list.  yes  Last refill. 12/22/2022 #100 0 rf  Future visit scheduled.   yes  Notes to clinic.  Abnormal labs. Please review for refill.    Requested Prescriptions  Pending Prescriptions Disp Refills   clopidogrel (PLAVIX) 75 MG tablet [Pharmacy Med Name: Clopidogrel Bisulfate 75 MG Oral Tablet] 100 tablet 2    Sig: TAKE 1 TABLET BY MOUTH DAILY     Hematology: Antiplatelets - clopidogrel Failed - 03/02/2023  1:22 AM      Failed - HCT in normal range and within 180 days    Hematocrit  Date Value Ref Range Status  12/21/2022 29.8 (L) 34.0 - 46.6 % Final         Failed - HGB in normal range and within 180 days    Hemoglobin  Date Value Ref Range Status  12/21/2022 9.4 (L) 11.1 - 15.9 g/dL Final         Failed - Cr in normal range and within 360 days    Creatinine  Date Value Ref Range Status  05/12/2013 1.23 0.60 - 1.30 mg/dL Final   Creatinine, Ser  Date Value Ref Range Status  12/21/2022 1.64 (H) 0.57 - 1.00 mg/dL Final   Creatinine, POC  Date Value Ref Range Status  01/31/2017 n/a mg/dL Final         Passed - PLT in normal range and within 180 days    Platelets  Date Value Ref Range Status  12/21/2022 229 150 - 450 x10E3/uL Final         Passed - Valid encounter within last 6 months    Recent Outpatient Visits           2 months ago Type 2 diabetes mellitus with stage 3b chronic kidney disease, without long-term current use of insulin (HCC)   [place] [facility] [place], [place], PA-C   6 months ago Type 2 diabetes mellitus with stage 3b chronic kidney disease, without long-term current use of insulin (HCC)   [place] [facility] Malva Limes, MD   9 months ago Acute cough   [place] [facility] Malva Limes, MD   10 months ago Type 2 diabetes mellitus with stage 3b chronic kidney disease,  without long-term current use of insulin (HCC)   [facility] Malva Limes, MD   1 year ago Type 2 diabetes mellitus with stage 3b chronic kidney disease, without long-term current use of insulin (HCC)   [place] [facility] Malva Limes, MD       Future Appointments             In 2 weeks Fisher, Demetrios Isaacs, MD [facility], [facility]

## 2023-03-14 ENCOUNTER — Other Ambulatory Visit: Payer: Medicare Other

## 2023-03-22 ENCOUNTER — Encounter: Payer: Self-pay | Admitting: Family Medicine

## 2023-03-22 ENCOUNTER — Ambulatory Visit (INDEPENDENT_AMBULATORY_CARE_PROVIDER_SITE_OTHER): Payer: Medicare Other | Admitting: Family Medicine

## 2023-03-22 VITALS — BP 169/65 | HR 75 | Ht 66.0 in | Wt 156.6 lb

## 2023-03-22 DIAGNOSIS — I10 Essential (primary) hypertension: Secondary | ICD-10-CM | POA: Diagnosis not present

## 2023-03-22 DIAGNOSIS — Z7984 Long term (current) use of oral hypoglycemic drugs: Secondary | ICD-10-CM

## 2023-03-22 DIAGNOSIS — E1122 Type 2 diabetes mellitus with diabetic chronic kidney disease: Secondary | ICD-10-CM

## 2023-03-22 DIAGNOSIS — N1832 Chronic kidney disease, stage 3b: Secondary | ICD-10-CM

## 2023-03-22 LAB — POCT GLYCOSYLATED HEMOGLOBIN (HGB A1C): Hemoglobin A1C: 6.9 % — AB (ref 4.0–5.6)

## 2023-03-22 MED ORDER — FARXIGA 5 MG PO TABS: 5.0000 mg | ORAL_TABLET | Freq: Every day | ORAL | 3 refills | Status: AC

## 2023-03-22 NOTE — Patient Instructions (Signed)
.   Please review the attached list of medications and notify my office if there are any errors.   . Please bring all of your medications to every appointment so we can make sure that our medication list is the same as yours.   

## 2023-04-04 NOTE — Progress Notes (Signed)
Established patient visit   Patient: Rachel Lester   DOB: 11-Nov-1940   82 y.o. Female  MRN: [id number] Visit Date: 03/22/2023  Today's healthcare provider: [name], MD   Chief Complaint  Patient presents with   Medical Management of Chronic Issues    3 month follow-up   Diabetes   Hypertension    Avg 120-140/   Subjective    Discussed the use of AI scribe software for clinical note transcription with the patient, who gave verbal consent to proceed.  History of Present Illness   The patient, with a history of diabetes, hypertension, and kidney disease, presents for a routine follow-up. She reports feeling well overall, with no new health concerns. Her blood sugar control has improved significantly, with a drop in HbA1c from 8.2 in September to 6.9 currently, despite no changes in her medication regimen.  She has been experiencing elevated blood pressure, which she attributes to recent personal stressors, including the death of a friend and another friend's hospitalization. She is also concerned about a potential issue with her [place] prescription, as her pharmacy contacted her about an expiration issue, though she still has a supply left.  In addition to her primary care, she is also under the care of a nephrologist, Dr. Thedore Mins, who recently recommended she start taking Lokelma every other day due to slightly elevated potassium levels. She has not yet started this medication but plans to do so soon.       Medications: Outpatient Medications Prior to Visit  Medication Sig   sodium zirconium cyclosilicate (LOKELMA) 10 g PACK packet Take 10 g by mouth every other day as needed.   ACCU-CHEK AVIVA PLUS test strip CHECK BLOOD SUGAR THREE  TIMES DAILY   amLODipine (NORVASC) 2.5 MG tablet TAKE 1 TABLET BY MOUTH DAILY   b complex vitamins tablet Take 1 tablet by mouth daily.   Blood Glucose Monitoring Suppl (ACCU-CHEK GUIDE ME) w/Device KIT USE TO TEST BLOOD SUGAR  ONCE DAILY    Cholecalciferol (VITAMIN D3) 2000 UNITS capsule Take 2,000 Units by mouth daily.   clopidogrel (PLAVIX) 75 MG tablet TAKE 1 TABLET BY MOUTH DAILY   Cyanocobalamin (VITAMIN B12) 500 MCG TABS Take by mouth.   Ferrous Sulfate (IRON) 325 (65 Fe) MG TABS Take by mouth.   glimepiride (AMARYL) 4 MG tablet TAKE 1 TABLET BY MOUTH IN THE  MORNING   losartan (COZAAR) 25 MG tablet Take 1 tablet by mouth daily.   Magnesium 500 MG TABS Take by mouth daily.   metFORMIN (GLUCOPHAGE) 500 MG tablet TAKE 1 TABLET BY MOUTH DAILY  WITH SUPPER   pioglitazone (ACTOS) 45 MG tablet TAKE 1 TABLET BY MOUTH DAILY   Probiotic Product (UP4 PROBIOTICS ADULT) CAPS Take by mouth.   pyridOXINE (VITAMIN B-6) 100 MG tablet Take 100 mg by mouth daily.   rosuvastatin (CRESTOR) 20 MG tablet TAKE 1 TABLET BY MOUTH  DAILY   torsemide (DEMADEX) 20 MG tablet Take 20 mg by mouth daily as needed.    vitamin C (ASCORBIC ACID) 500 MG tablet Take 500 mg by mouth daily.   zinc gluconate 50 MG tablet Take 50 mg by mouth daily.   [DISCONTINUED] FARXIGA 5 MG TABS tablet TAKE 1 TABLET BY MOUTH DAILY   No facility-administered medications prior to visit.   Review of Systems  Constitutional:  Negative for appetite change, chills, fatigue and fever.  Respiratory:  Negative for chest tightness and shortness of breath.   Cardiovascular:  Negative for chest pain and palpitations.  Gastrointestinal:  Negative for abdominal pain, nausea and vomiting.  Neurological:  Negative for dizziness and weakness.       Objective    BP (!) 169/65 (BP Location: Right Arm, Patient Position: Sitting, Cuff Size: Normal)   Pulse 75   Ht 5\' 6"  (1.676 m)   Wt 156 lb 9.6 oz (71 kg)   SpO2 100%   BMI 25.28 kg/m   Physical Exam   General: Appearance:    Well developed, well nourished female in no acute distress  Eyes:    PERRL, conjunctiva/corneas clear, EOM's intact       Lungs:     Clear to auscultation bilaterally, respirations unlabored  Heart:     Normal heart rate. Normal rhythm. No murmurs, rubs, or gallops.    MS:   All extremities are intact.    Neurologic:   Awake, alert, oriented x 3. No apparent focal neurological defect.        Results for orders placed or performed in visit on 03/22/23  POCT HgB A1C  Result Value Ref Range   Hemoglobin A1C 6.9 (A) 4.0 - 5.6 %     Assessment & Plan        Type 2 Diabetes Mellitus Improved glycemic control with A1c of 6.9, down from 8.2 in September. No changes in medication regimen. -Continue current medication regimen. -Resend prescription for Farxiga 5mg  to [facility] home delivery.  Hypertension Elevated blood pressure today, possibly due to external stressors. Patient is currently on Losartan. -No changes to medication at this time. Anticipate potential adjustment by nephrologist pending potassium levels.  Hyperkalemia Potassium level of 5.6 noted by nephrologist. Patient was prescribed Lokelma recently by nephrology - follow up nephrology as scheduled end of March.    Return in about 16 weeks (around 07/12/2023) for Diabetes, Hypertension.      [name], MD  [facility] [phone] (phone) [phone] (fax)  [facility]

## 2023-05-16 ENCOUNTER — Other Ambulatory Visit: Payer: Self-pay | Admitting: Family Medicine

## 2023-05-17 ENCOUNTER — Other Ambulatory Visit: Payer: Self-pay | Admitting: Family Medicine

## 2023-05-17 DIAGNOSIS — I10 Essential (primary) hypertension: Secondary | ICD-10-CM

## 2023-05-17 NOTE — Telephone Encounter (Signed)
Requested Prescriptions  Pending Prescriptions Disp Refills   glimepiride (AMARYL) 4 MG tablet [Pharmacy Med Name: Glimepiride 4 MG Oral Tablet] 100 tablet 0    Sig: TAKE 1 TABLET BY MOUTH IN THE  MORNING     Endocrinology:  Diabetes - Sulfonylureas Failed - 05/17/2023 11:07 AM      Failed - Cr in normal range and within 360 days    Creatinine  Date Value Ref Range Status  05/12/2013 1.23 0.60 - 1.30 mg/dL Final   Creatinine, Ser  Date Value Ref Range Status  12/21/2022 1.64 (H) 0.57 - 1.00 mg/dL Final   Creatinine, POC  Date Value Ref Range Status  01/31/2017 n/a mg/dL Final         Passed - HBA1C is between 0 and 7.9 and within 180 days    Hemoglobin A1C  Date Value Ref Range Status  03/22/2023 6.9 (A) 4.0 - 5.6 % Final   Hgb A1c MFr Bld  Date Value Ref Range Status  04/06/2021 6.6 (H) 4.8 - 5.6 % Final    Comment:             Prediabetes: 5.7 - 6.4          Diabetes: >6.4          Glycemic control for adults with diabetes: <7.0          Passed - Valid encounter within last 6 months    Recent Outpatient Visits           1 month ago Type 2 diabetes mellitus with stage 3b chronic kidney disease, without long-term current use of insulin (HCC)   [place] [facility] Malva Limes, MD   4 months ago Type 2 diabetes mellitus with stage 3b chronic kidney disease, without long-term current use of insulin (HCC)   [place] [facility] [place], [place], PA-C   8 months ago Type 2 diabetes mellitus with stage 3b chronic kidney disease, without long-term current use of insulin (HCC)   [place] [facility] Malva Limes, MD   11 months ago Acute cough   [place] [facility] Malva Limes, MD   1 year ago Type 2 diabetes mellitus with stage 3b chronic kidney disease, without long-term current use of insulin (HCC)   [place] [facility] Malva Limes, MD       Future Appointments              In 2 months Fisher, Demetrios Isaacs, MD [facility], [facility]

## 2023-05-17 NOTE — Telephone Encounter (Signed)
Requested Prescriptions  Pending Prescriptions Disp Refills   amLODipine (NORVASC) 2.5 MG tablet [Pharmacy Med Name: amLODIPine Besylate 2.5 MG Oral Tablet] 100 tablet 0    Sig: TAKE 1 TABLET BY MOUTH DAILY     Cardiovascular: Calcium Channel Blockers 2 Failed - 05/17/2023 11:10 AM      Failed - Last BP in normal range    BP Readings from Last 1 Encounters:  03/22/23 (!) 169/65         Passed - Last Heart Rate in normal range    Pulse Readings from Last 1 Encounters:  03/22/23 75         Passed - Valid encounter within last 6 months    Recent Outpatient Visits           1 month ago Type 2 diabetes mellitus with stage 3b chronic kidney disease, without long-term current use of insulin (HCC)   [place] [facility] Malva Limes, MD   4 months ago Type 2 diabetes mellitus with stage 3b chronic kidney disease, without long-term current use of insulin (HCC)   [place] [facility] [place], [place], PA-C   8 months ago Type 2 diabetes mellitus with stage 3b chronic kidney disease, without long-term current use of insulin (HCC)   [place] [facility] Malva Limes, MD   11 months ago Acute cough   [place] [facility] Malva Limes, MD   1 year ago Type 2 diabetes mellitus with stage 3b chronic kidney disease, without long-term current use of insulin (HCC)   [place] [facility] Malva Limes, MD       Future Appointments             In 2 months Fisher, Demetrios Isaacs, MD [facility], [facility]

## 2023-05-30 ENCOUNTER — Ambulatory Visit: Payer: Medicare Other

## 2023-05-30 DIAGNOSIS — Z0000 Encounter for general adult medical examination without abnormal findings: Secondary | ICD-10-CM

## 2023-05-30 NOTE — Patient Instructions (Addendum)
Rachel Lester , Thank you for taking time to come for your Medicare Wellness Visit. I appreciate your ongoing commitment to your health goals. Please review the following plan we discussed and let me know if I can assist you in the future.   Referrals/Orders/Follow-Ups/Clinician Recommendations: NONE  This is a list of the screening recommended for you and due dates:  Health Maintenance  Topic Date Due   COVID-19 Vaccine (1 - 2024-25 season) Never done   Hemoglobin A1C  09/20/2023   Yearly kidney function blood test for diabetes  12/21/2023   Yearly kidney health urinalysis for diabetes  12/21/2023   Eye exam for diabetics  01/12/2024   Medicare Annual Wellness Visit  05/29/2024   DEXA scan (bone density measurement)  01/02/2025   DTaP/Tdap/Td vaccine (3 - Td or Tdap) 09/30/2026   Pneumonia Vaccine  Completed   Flu Shot  Completed   Zoster (Shingles) Vaccine  Completed   HPV Vaccine  Aged Out   Hepatitis C Screening  Discontinued    Advanced directives: (ACP Link)Information on Advanced Care Planning can be found at [facility] [place] Advance Health Care Directives Advance Health Care Directives ([email or web address]   Next Medicare Annual Wellness Visit scheduled for next year: Yes   06/04/24 @ 10:10 AM BY PHONE

## 2023-05-30 NOTE — Progress Notes (Signed)
Subjective:   Rachel Lester is a 83 y.o. female who presents for Medicare Annual (Subsequent) preventive examination.  Visit Complete: Virtual I connected with  Rachel Lester on 05/30/23 by a audio enabled telemedicine application and verified that I am speaking with the correct person using two identifiers.  This patient declined Interactive audio and Acupuncturist. Therefore the visit was completed with audio only.   Patient Location: Home  Provider Location: Office/Clinic  I discussed the limitations of evaluation and management by telemedicine. The patient expressed understanding and agreed to proceed.  Vital Signs: Because this visit was a virtual/telehealth visit, some criteria may be missing or patient reported. Any vitals not documented were not able to be obtained and vitals that have been documented are patient reported.   Cardiac Risk Factors include: advanced age (>16men, >79 women);hypertension;dyslipidemia;diabetes mellitus;microalbuminuria     Objective:    There were no vitals filed for this visit. There is no height or weight on file to calculate BMI.     05/30/2023    8:20 AM 05/24/2022    8:44 AM 12/19/2019    1:30 PM 01/15/2019    4:10 PM 12/28/2018   10:34 AM 12/17/2018    1:20 PM 11/17/2017    8:35 AM  Advanced Directives  Does Patient Have a Medical Advance Directive? No No No No No No No  Would patient like information on creating a medical advance directive? No - Patient declined No - Patient declined No - Patient declined No - Patient declined  No - Patient declined Yes (MAU/Ambulatory/Procedural Areas - Information given)    Current Medications (verified) Outpatient Encounter Medications as of 05/30/2023  Medication Sig   ACCU-CHEK AVIVA PLUS test strip CHECK BLOOD SUGAR THREE  TIMES DAILY   amLODipine (NORVASC) 2.5 MG tablet TAKE 1 TABLET BY MOUTH DAILY   b complex vitamins tablet Take 1 tablet by mouth daily.   Blood Glucose Monitoring  Suppl (ACCU-CHEK GUIDE ME) w/Device KIT USE TO TEST BLOOD SUGAR  ONCE DAILY   Cholecalciferol (VITAMIN D3) 2000 UNITS capsule Take 2,000 Units by mouth daily.   clopidogrel (PLAVIX) 75 MG tablet TAKE 1 TABLET BY MOUTH DAILY   Cyanocobalamin (VITAMIN B12) 500 MCG TABS Take by mouth.   Ferrous Sulfate (IRON) 325 (65 Fe) MG TABS Take by mouth.   glimepiride (AMARYL) 4 MG tablet TAKE 1 TABLET BY MOUTH IN THE  MORNING   losartan (COZAAR) 25 MG tablet Take 1 tablet by mouth daily.   Magnesium 500 MG TABS Take by mouth daily.   metFORMIN (GLUCOPHAGE) 500 MG tablet TAKE 1 TABLET BY MOUTH DAILY  WITH SUPPER   pioglitazone (ACTOS) 45 MG tablet TAKE 1 TABLET BY MOUTH DAILY   Probiotic Product (UP4 PROBIOTICS ADULT) CAPS Take by mouth.   pyridOXINE (VITAMIN B-6) 100 MG tablet Take 100 mg by mouth daily.   rosuvastatin (CRESTOR) 20 MG tablet TAKE 1 TABLET BY MOUTH  DAILY   sodium zirconium cyclosilicate (LOKELMA) 10 g PACK packet Take 10 g by mouth every other day as needed.   torsemide (DEMADEX) 20 MG tablet Take 20 mg by mouth daily as needed.    vitamin C (ASCORBIC ACID) 500 MG tablet Take 500 mg by mouth daily.   zinc gluconate 50 MG tablet Take 50 mg by mouth daily.   FARXIGA 5 MG TABS tablet Take 1 tablet (5 mg total) by mouth daily. (Patient not taking: Reported on 05/30/2023)   No facility-administered encounter medications on file  as of 05/30/2023.    Allergies (verified) Metformin hcl   History: Past Medical History:  Diagnosis Date   COVID-19 12/2018   Diabetes mellitus without complication (HCC)    Hyperlipidemia    Hypertension    Past Surgical History:  Procedure Laterality Date   LIPOMA EXCISION Right 12/25/2018   In-office, right forearm lipomas x 2   Family History  Problem Relation Age of Onset   Alzheimer's disease Mother    Coronary artery disease Father    Arthritis Sister        Lupus   Diabetes Brother        Type 2   Diabetes Sister        Type 2   Cancer  Sister    Social History   Socioeconomic History   Marital status: Widowed    Spouse name: Not on file   Number of children: 2   Years of education: Not on file   Highest education level: 12th grade  Occupational History   Occupation: retired   Occupation: has rental properties  Tobacco Use   Smoking status: Former    Current packs/day: 0.00    Average packs/day: 2.0 packs/day for 20.0 years (40.0 ttl pk-yrs)    Types: Cigarettes    Start date: 77    Quit date: 1980    Years since quitting: 45.1   Smokeless tobacco: Never   Tobacco comments:    smoked about 2 packs per day, smoked for about 20 years; Quit in 1980  Vaping Use   Vaping status: Never Used  Substance and Sexual Activity   Alcohol use: Yes    Alcohol/week: 0.0 standard drinks of alcohol    Comment: occasional- 1/month (wine)   Drug use: No   Sexual activity: Not on file  Other Topics Concern   Not on file  Social History Narrative   Not on file   Social Drivers of Health   Financial Resource Strain: Low Risk  (05/30/2023)   Overall Financial Resource Strain (CARDIA)    Difficulty of Paying Living Expenses: Not hard at all  Food Insecurity: No Food Insecurity (05/30/2023)   Hunger Vital Sign    Worried About Running Out of Food in the Last Year: Never true    Ran Out of Food in the Last Year: Never true  Transportation Needs: No Transportation Needs (05/30/2023)   PRAPARE - Administrator, Civil Service (Medical): No    Lack of Transportation (Non-Medical): No  Physical Activity: Insufficiently Active (05/30/2023)   Exercise Vital Sign    Days of Exercise per Week: 3 days    Minutes of Exercise per Session: 30 min  Stress: No Stress Concern Present (05/30/2023)   [employer] of Occupational Health - Occupational Stress Questionnaire    Feeling of Stress : Not at all  Social Connections: Moderately Isolated (05/30/2023)   Social Connection and Isolation Panel [NHANES]    Frequency of  Communication with Friends and Family: Three times a week    Frequency of Social Gatherings with Friends and Family: Once a week    Attends Religious Services: More than 4 times per year    Active Member of [employer] or Organizations: No    Attends Banker Meetings: Never    Marital Status: Widowed    Tobacco Counseling Counseling given: Not Answered Tobacco comments: smoked about 2 packs per day, smoked for about 20 years; Quit in 1980   Clinical Intake:  Pre-visit preparation completed:  Yes  Pain : No/denies pain     BMI - recorded: 25.2 Nutritional Status: BMI 25 -29 Overweight Nutritional Risks: None Diabetes: Yes CBG done?: No Did pt. bring in CBG monitor from home?: No  How often do you need to have someone help you when you read instructions, pamphlets, or other written materials from your doctor or pharmacy?: 1 - Never  Interpreter Needed?: No  Information entered by :: Kennedy Bucker, LPN   Activities of Daily Living    05/30/2023    8:21 AM  In your present state of health, do you have any difficulty performing the following activities:  Hearing? 0  Vision? 0  Difficulty concentrating or making decisions? 0  Walking or climbing stairs? 0  Dressing or bathing? 0  Doing errands, shopping? 0  Preparing Food and eating ? N  Using the Toilet? N  In the past six months, have you accidently leaked urine? N  Do you have problems with loss of bowel control? N  Managing your Medications? N  Managing your Finances? N  Housekeeping or managing your Housekeeping? N    Patient Care Team: Malva Limes, MD as PCP - General (Family Medicine) Mosetta Pigeon, MD (Nephrology) Henrene Dodge, MD as Consulting Physician (General Surgery) Nicholaus Corolla, MD as Referring Physician (Ophthalmology) Lamar Blinks, MD as Consulting Physician (Cardiology)  Indicate any recent Medical Services you may have received from other than [facility] providers in the past year  (date may be approximate).     Assessment:   This is a routine wellness examination for Issabelle.  Hearing/Vision screen Hearing Screening - Comments:: NO AIDS Vision Screening - Comments:: READERS- DR.ZHANG OR [facility]   Goals Addressed             This Visit's Progress    DIET - EAT MORE FRUITS AND VEGETABLES         Depression Screen    05/30/2023    8:18 AM 05/24/2022    8:37 AM 12/21/2021   10:59 AM 04/06/2021   10:06 AM 12/19/2019    1:32 PM 12/17/2018    1:21 PM 11/23/2018   10:47 AM  PHQ 2/9 Scores  PHQ - 2 Score 0 0 0 0 0 0 0  PHQ- 9 Score 0 0 0 0   0    Fall Risk    05/30/2023    8:21 AM 05/24/2022    8:46 AM 12/21/2021   10:58 AM 04/06/2021   10:05 AM 12/19/2019    1:31 PM  Fall Risk   Falls in the past year? 0 0 0 0 0  Number falls in past yr: 0 0 0 0 0  Injury with Fall? 0 0 0 0 0  Risk for fall due to : No Fall Risks No Fall Risks  No Fall Risks   Follow up Falls prevention discussed;Falls evaluation completed Falls prevention discussed;Falls evaluation completed Falls evaluation completed Falls evaluation completed     MEDICARE RISK AT HOME: Medicare Risk at Home Any stairs in or around the home?: Yes If so, are there any without handrails?: No Home free of loose throw rugs in walkways, pet beds, electrical cords, etc?: Yes Adequate lighting in your home to reduce risk of falls?: Yes Life alert?: No Use of a cane, walker or w/c?: No Grab bars in the bathroom?: No Shower chair or bench in shower?: No Elevated toilet seat or a handicapped toilet?: Yes  TIMED UP AND GO:  Was the test performed?  No    Cognitive Function:        05/30/2023    8:22 AM 05/24/2022    9:08 AM 12/19/2019    1:33 PM 12/17/2018    1:27 PM 11/16/2016    2:58 PM  6CIT Screen  What Year? 0 points 0 points 0 points 0 points 0 points  What month? 0 points 0 points 0 points 0 points 0 points  What time? 0 points 0 points 0 points 0 points 0 points  Count back from 20 0 points 0  points 0 points 0 points 0 points  Months in reverse 0 points 0 points 0 points 0 points 0 points  Repeat phrase 0 points 0 points 0 points 0 points 0 points  Total Score 0 points 0 points 0 points 0 points 0 points    Immunizations Immunization History  Administered Date(s) Administered   Fluad Quad(high Dose 65+) 01/02/2019, 01/13/2020, 04/06/2021, 04/29/2022   Fluad Trivalent(High Dose 65+) 12/21/2022   Influenza, High Dose Seasonal PF 01/12/2015, 03/29/2016, 01/31/2017, 01/02/2018   Pneumococcal Conjugate-13 10/07/2013   Pneumococcal Polysaccharide-23 02/17/2004, 02/09/2011   Td 09/01/2006   Tdap 09/29/2016   Zoster Recombinant(Shingrix) 03/28/2019, 09/26/2019   Zoster, Live 04/09/2009    TDAP status: Up to date  Flu Vaccine status: Up to date  Pneumococcal vaccine status: Declined,  Education has been provided regarding the importance of this vaccine but patient still declined. Advised may receive this vaccine at local pharmacy or Health Dept. Aware to provide a copy of the vaccination record if obtained from local pharmacy or Health Dept. Verbalized acceptance and understanding.   Covid-19 vaccine status: Declined, Education has been provided regarding the importance of this vaccine but patient still declined. Advised may receive this vaccine at local pharmacy or Health Dept.or vaccine clinic. Aware to provide a copy of the vaccination record if obtained from local pharmacy or Health Dept. Verbalized acceptance and understanding.  Qualifies for Shingles Vaccine? Yes   Zostavax completed Yes   Shingrix Completed?: Yes  Screening Tests Health Maintenance  Topic Date Due   COVID-19 Vaccine (1 - 2024-25 season) Never done   HEMOGLOBIN A1C  09/20/2023   Diabetic kidney evaluation - eGFR measurement  12/21/2023   Diabetic kidney evaluation - Urine ACR  12/21/2023   OPHTHALMOLOGY EXAM  01/12/2024   Medicare Annual Wellness (AWV)  05/29/2024   DEXA SCAN  01/02/2025    DTaP/Tdap/Td (3 - Td or Tdap) 09/30/2026   Pneumonia Vaccine 32+ Years old  Completed   INFLUENZA VACCINE  Completed   Zoster Vaccines- Shingrix  Completed   HPV VACCINES  Aged Out   Hepatitis C Screening  Discontinued    Health Maintenance  Health Maintenance Due  Topic Date Due   COVID-19 Vaccine (1 - 2024-25 season) Never done    Colorectal cancer screening: No longer required.   Mammogram status: No longer required due to AGE.  Bone Density status: Completed 01/03/23. Results reflect: Bone density results: OSTEOPENIA. Repeat every 5 years.  Lung Cancer Screening: (Low Dose CT Chest recommended if Age 34-80 years, 20 pack-year currently smoking OR have quit w/in 15years.) does not qualify.     Additional Screening:  Hepatitis C Screening: does not qualify; Completed 07/10/18  Vision Screening: Recommended annual ophthalmology exams for early detection of glaucoma and other disorders of the eye. Is the patient up to date with their annual eye exam?  Yes  Who is the provider or what is the name of the office in  which the patient attends annual eye exams? DR.PORFILIO If pt is not established with a provider, would they like to be referred to a provider to establish care? No .   Dental Screening: Recommended annual dental exams for proper oral hygiene  Diabetic Foot Exam: Diabetic Foot Exam: Completed AT [facility] OFFICE IN SEPTEMBER 2024  Community Resource Referral / Chronic Care Management: CRR required this visit?  No   CCM required this visit?  No     Plan:     I have personally reviewed and noted the following in the patient's chart:   Medical and social history Use of alcohol, tobacco or illicit drugs  Current medications and supplements including opioid prescriptions. Patient is not currently taking opioid prescriptions. Functional ability and status Nutritional status Physical activity Advanced directives List of other physicians Hospitalizations,  surgeries, and ER visits in previous 12 months Vitals Screenings to include cognitive, depression, and falls Referrals and appointments  In addition, I have reviewed and discussed with patient certain preventive protocols, quality metrics, and best practice recommendations. A written personalized care plan for preventive services as well as general preventive health recommendations were provided to patient.     Hal Hope, LPN   1/61/0960   After Visit Summary: (MyChart) Due to this being a telephonic visit, the after visit summary with patients personalized plan was offered to patient via MyChart   Nurse Notes: NONE

## 2023-06-06 DIAGNOSIS — R809 Proteinuria, unspecified: Secondary | ICD-10-CM | POA: Diagnosis not present

## 2023-06-06 DIAGNOSIS — E1129 Type 2 diabetes mellitus with other diabetic kidney complication: Secondary | ICD-10-CM | POA: Diagnosis not present

## 2023-06-06 DIAGNOSIS — N1832 Chronic kidney disease, stage 3b: Secondary | ICD-10-CM | POA: Diagnosis not present

## 2023-06-06 DIAGNOSIS — D631 Anemia in chronic kidney disease: Secondary | ICD-10-CM | POA: Diagnosis not present

## 2023-06-06 DIAGNOSIS — E1122 Type 2 diabetes mellitus with diabetic chronic kidney disease: Secondary | ICD-10-CM | POA: Diagnosis not present

## 2023-06-12 DIAGNOSIS — I10 Essential (primary) hypertension: Secondary | ICD-10-CM | POA: Diagnosis not present

## 2023-06-12 DIAGNOSIS — R600 Localized edema: Secondary | ICD-10-CM | POA: Diagnosis not present

## 2023-06-12 DIAGNOSIS — E1122 Type 2 diabetes mellitus with diabetic chronic kidney disease: Secondary | ICD-10-CM | POA: Diagnosis not present

## 2023-06-12 DIAGNOSIS — D631 Anemia in chronic kidney disease: Secondary | ICD-10-CM | POA: Diagnosis not present

## 2023-06-12 DIAGNOSIS — N1832 Chronic kidney disease, stage 3b: Secondary | ICD-10-CM | POA: Diagnosis not present

## 2023-06-12 DIAGNOSIS — N2581 Secondary hyperparathyroidism of renal origin: Secondary | ICD-10-CM | POA: Diagnosis not present

## 2023-06-29 ENCOUNTER — Other Ambulatory Visit: Payer: Self-pay | Admitting: Family Medicine

## 2023-06-29 DIAGNOSIS — E1122 Type 2 diabetes mellitus with diabetic chronic kidney disease: Secondary | ICD-10-CM

## 2023-07-03 ENCOUNTER — Telehealth: Payer: Self-pay

## 2023-07-03 ENCOUNTER — Encounter: Payer: Self-pay | Admitting: Oncology

## 2023-07-03 ENCOUNTER — Inpatient Hospital Stay: Attending: Oncology | Admitting: Oncology

## 2023-07-03 ENCOUNTER — Inpatient Hospital Stay

## 2023-07-03 VITALS — BP 157/56 | HR 77 | Temp 97.3°F | Resp 18 | Ht 66.0 in | Wt 165.1 lb

## 2023-07-03 DIAGNOSIS — E1122 Type 2 diabetes mellitus with diabetic chronic kidney disease: Secondary | ICD-10-CM | POA: Insufficient documentation

## 2023-07-03 DIAGNOSIS — I129 Hypertensive chronic kidney disease with stage 1 through stage 4 chronic kidney disease, or unspecified chronic kidney disease: Secondary | ICD-10-CM | POA: Diagnosis not present

## 2023-07-03 DIAGNOSIS — D631 Anemia in chronic kidney disease: Secondary | ICD-10-CM

## 2023-07-03 DIAGNOSIS — N184 Chronic kidney disease, stage 4 (severe): Secondary | ICD-10-CM | POA: Diagnosis not present

## 2023-07-03 LAB — IRON AND TIBC
Iron: 65 ug/dL (ref 28–170)
Saturation Ratios: 19 % (ref 10.4–31.8)
TIBC: 349 ug/dL (ref 250–450)
UIBC: 284 ug/dL

## 2023-07-03 LAB — CBC WITH DIFFERENTIAL/PLATELET
Abs Immature Granulocytes: 0.09 10*3/uL — ABNORMAL HIGH (ref 0.00–0.07)
Basophils Absolute: 0 10*3/uL (ref 0.0–0.1)
Basophils Relative: 0 %
Eosinophils Absolute: 0.2 10*3/uL (ref 0.0–0.5)
Eosinophils Relative: 4 %
HCT: 29.3 % — ABNORMAL LOW (ref 36.0–46.0)
Hemoglobin: 9.1 g/dL — ABNORMAL LOW (ref 12.0–15.0)
Immature Granulocytes: 2 %
Lymphocytes Relative: 14 %
Lymphs Abs: 0.7 10*3/uL (ref 0.7–4.0)
MCH: 29.8 pg (ref 26.0–34.0)
MCHC: 31.1 g/dL (ref 30.0–36.0)
MCV: 96.1 fL (ref 80.0–100.0)
Monocytes Absolute: 0.4 10*3/uL (ref 0.1–1.0)
Monocytes Relative: 8 %
Neutro Abs: 3.7 10*3/uL (ref 1.7–7.7)
Neutrophils Relative %: 72 %
Platelets: 199 10*3/uL (ref 150–400)
RBC: 3.05 MIL/uL — ABNORMAL LOW (ref 3.87–5.11)
RDW: 13.9 % (ref 11.5–15.5)
WBC: 5.2 10*3/uL (ref 4.0–10.5)
nRBC: 0 % (ref 0.0–0.2)

## 2023-07-03 LAB — TECHNOLOGIST SMEAR REVIEW
Plt Morphology: NORMAL
RBC MORPHOLOGY: NORMAL
WBC MORPHOLOGY: NORMAL

## 2023-07-03 LAB — FERRITIN: Ferritin: 59 ng/mL (ref 11–307)

## 2023-07-03 NOTE — Telephone Encounter (Signed)
-----   Message from Rickard Patience sent at 07/03/2023  1:03 PM EDT ----- Please arrange her to get IV venofer weekly x 3.  Follow up in 3 months lab prior to MD +/- venofer.  Thanks

## 2023-07-03 NOTE — Assessment & Plan Note (Signed)
 Labs are reviewed and discussed with patient. 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  Rule out other etiologies.  Check protein electrophoresis and light chain ratio. Ferritin level is 59, less than 200.  Recommend IV Venofer weekly x 3 to further improve iron store. I discussed about option of proceed with IV Venofer treatments. I discussed about the potential risks including but not limited to allergic reactions/infusion reactions including anaphylactic reactions, diarrhea, phlebitis, high blood pressure, wheezing, SOB, skin rash, weight gain,dark urine, leg swelling, back pain, headache, nausea and fatigue, etc. patient agrees with the plan. Will arrange 3 doses of IV Venofer treatments.  Follow-up in 3 months, repeat levels if hemoglobin does not improve despite adequate iron stores, consider erythropoietin replacement therapy.

## 2023-07-03 NOTE — Assessment & Plan Note (Signed)
Encourage oral hydration and avoid nephrotoxins. Follow up with nephrology

## 2023-07-03 NOTE — Telephone Encounter (Signed)
 Rachel Lester can you please schedule and notify patient of appointments.

## 2023-07-03 NOTE — Progress Notes (Addendum)
 Hematology/Oncology Consult note [phone] [phone]        REFERRING PROVIDER: Malva Limes, MD   CHIEF COMPLAINTS/REASON FOR VISIT:  Evaluation of anemia   ASSESSMENT & PLAN:   Anemia in chronic kidney disease (CKD) Labs are reviewed and discussed with patient. 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  Rule out other etiologies.  Check protein electrophoresis and light chain ratio. Ferritin level is 59, less than 200.  Recommend IV Venofer weekly x 3 to further improve iron store. I discussed about option of proceed with IV Venofer treatments. I discussed about the potential risks including but not limited to allergic reactions/infusion reactions including anaphylactic reactions, diarrhea, phlebitis, high blood pressure, wheezing, SOB, skin rash, weight gain,dark urine, leg swelling, back pain, headache, nausea and fatigue, etc. patient agrees with the plan. Will arrange 3 doses of IV Venofer treatments.  Follow-up in 3 months, repeat levels if hemoglobin does not improve despite adequate iron stores, consider erythropoietin replacement therapy.  CKD (chronic kidney disease) stage 4, GFR 15-29 ml/min (HCC) Encourage oral hydration and avoid nephrotoxins.  Follow up with nephrology   Orders Placed This Encounter  Procedures   Ferritin    Standing Status:   Future    Number of Occurrences:   1    Expected Date:   07/03/2023    Expiration Date:   01/03/2024   Iron and TIBC    Standing Status:   Future    Number of Occurrences:   1    Expected Date:   07/03/2023    Expiration Date:   07/02/2024   CBC with Differential/Platelet    Standing Status:   Future    Number of Occurrences:   1    Expected Date:   07/03/2023    Expiration Date:   07/02/2024   Multiple Myeloma Panel (SPEP&IFE w/QIG)     Standing Status:   Future    Number of Occurrences:   1    Expected Date:   07/03/2023    Expiration Date:   07/02/2024   Kappa/lambda light chains    Standing Status:   Future    Number of Occurrences:   1    Expected Date:   07/03/2023    Expiration Date:   07/02/2024   Technologist smear review    Standing Status:   Future    Number of Occurrences:   1    Expected Date:   07/03/2023    Expiration Date:   07/02/2024    Clinical information::   anemia   Iron and TIBC    Standing Status:   Future    Expected Date:   10/03/2023    Expiration Date:   07/02/2024   Ferritin    Standing Status:   Future    Expected Date:   10/03/2023    Expiration Date:   01/03/2024   Retic Panel    Standing Status:   Future    Expected Date:   10/03/2023    Expiration Date:   07/02/2024   CBC with Differential/Platelet    Standing Status:   Future    Expected Date:   10/03/2023    Expiration Date:   07/02/2024    All questions were answered. The patient knows to call the clinic with any problems, questions or concerns.  Rickard Patience, MD, PhD [facility] Hematology Oncology 07/03/2023   HISTORY OF PRESENTING ILLNESS:   Rachel Lester is a  83 y.o.  female with PMH listed below was seen in consultation at the request of  Malva Limes, MD  for evaluation of anemia  She presents with anemia, with a hemoglobin level of 8.6 g/dL noted in her last blood work in February. She has been taking oral iron supplements. Her chronic kidney disease is likely contributing to her anemia due to impaired erythropoietin production. She has a history of hypertension and diabetes, which may be contributing factors to her kidney disease.  No blood in stool, shortness of breath, heart racing, or lightheadedness. She has some energy and notes a tiny bit of leg swelling on one side.    MEDICAL HISTORY:  Past Medical History:  Diagnosis Date   COVID-19 12/2018   Diabetes mellitus without complication (HCC)    Hyperlipidemia     Hypertension     SURGICAL HISTORY: Past Surgical History:  Procedure Laterality Date   LIPOMA EXCISION Right 12/25/2018   In-office, right forearm lipomas x 2    SOCIAL HISTORY: Social History   Socioeconomic History   Marital status: Widowed    Spouse name: Not on file   Number of children: 2   Years of education: Not on file   Highest education level: 12th grade  Occupational History   Occupation: retired   Occupation: has rental properties  Tobacco Use   Smoking status: Former    Current packs/day: 0.00    Average packs/day: 2.0 packs/day for 20.0 years (40.0 ttl pk-yrs)    Types: Cigarettes    Start date: 37    Quit date: 1980    Years since quitting: 45.2   Smokeless tobacco: Never   Tobacco comments:    smoked about 2 packs per day, smoked for about 20 years; Quit in 1980  Vaping Use   Vaping status: Never Used  Substance and Sexual Activity   Alcohol use: Yes    Alcohol/week: 0.0 standard drinks of alcohol    Comment: occasional- 1/month (wine)   Drug use: No   Sexual activity: Not on file  Other Topics Concern   Not on file  Social History Narrative   Not on file   Social Drivers of Health   Financial Resource Strain: Low Risk  (05/30/2023)   Overall Financial Resource Strain (CARDIA)    Difficulty of Paying Living Expenses: Not hard at all  Food Insecurity: No Food Insecurity (05/30/2023)   Hunger Vital Sign    Worried About Running Out of Food in the Last Year: Never true    Ran Out of Food in the Last Year: Never true  Transportation Needs: No Transportation Needs (05/30/2023)   PRAPARE - Administrator, Civil Service (Medical): No    Lack of Transportation (Non-Medical): No  Physical Activity: Insufficiently Active (05/30/2023)   Exercise Vital Sign    Days of Exercise per Week: 3 days    Minutes of Exercise per Session: 30 min  Stress: No Stress Concern Present (05/30/2023)   [employer] of Occupational Health -  Occupational Stress Questionnaire    Feeling of Stress : Not at all  Social Connections: Moderately Isolated (05/30/2023)   Social Connection and Isolation Panel [NHANES]    Frequency of Communication with Friends and Family: Three times a week    Frequency of Social Gatherings with Friends and Family: Once a week  Attends Religious Services: More than 4 times per year    Active Member of Clubs or Organizations: No    Attends Banker Meetings: Never    Marital Status: Widowed  Intimate Partner Violence: Not At Risk (05/30/2023)   Humiliation, Afraid, Rape, and Kick questionnaire    Fear of Current or Ex-Partner: No    Emotionally Abused: No    Physically Abused: No    Sexually Abused: No    FAMILY HISTORY: Family History  Problem Relation Age of Onset   Alzheimer's disease Mother    Coronary artery disease Father    Arthritis Sister        Lupus   Diabetes Sister        Type 2   Cancer Sister    Melanoma Sister    Diabetes Brother        Type 2    ALLERGIES:  is allergic to metformin hcl.  MEDICATIONS:  Current Outpatient Medications  Medication Sig Dispense Refill   ACCU-CHEK AVIVA PLUS test strip CHECK BLOOD SUGAR THREE  TIMES DAILY 300 strip 3   amLODipine (NORVASC) 2.5 MG tablet TAKE 1 TABLET BY MOUTH DAILY 100 tablet 0   Cholecalciferol (VITAMIN D3) 2000 UNITS capsule Take 2,000 Units by mouth daily.     clopidogrel (PLAVIX) 75 MG tablet TAKE 1 TABLET BY MOUTH DAILY 100 tablet 0   Cyanocobalamin (VITAMIN B12) 500 MCG TABS Take by mouth.     FARXIGA 5 MG TABS tablet Take 1 tablet (5 mg total) by mouth daily. 100 tablet 3   Ferrous Sulfate (IRON) 325 (65 Fe) MG TABS Take by mouth.     glimepiride (AMARYL) 4 MG tablet TAKE 1 TABLET BY MOUTH IN THE  MORNING 100 tablet 0   losartan (COZAAR) 25 MG tablet Take 1 tablet by mouth daily.     Magnesium 500 MG TABS Take by mouth daily.     metFORMIN (GLUCOPHAGE) 500 MG tablet TAKE 1 TABLET BY MOUTH DAILY  WITH  SUPPER 100 tablet 2   pioglitazone (ACTOS) 45 MG tablet TAKE 1 TABLET BY MOUTH DAILY 100 tablet 2   Probiotic Product (UP4 PROBIOTICS ADULT) CAPS Take by mouth.     pyridOXINE (VITAMIN B-6) 100 MG tablet Take 100 mg by mouth daily.     rosuvastatin (CRESTOR) 20 MG tablet TAKE 1 TABLET BY MOUTH  DAILY 90 tablet 1   sodium zirconium cyclosilicate (LOKELMA) 10 g PACK packet Take 10 g by mouth every other day as needed.     torsemide (DEMADEX) 20 MG tablet Take 20 mg by mouth daily as needed.      vitamin C (ASCORBIC ACID) 500 MG tablet Take 500 mg by mouth daily.     zinc gluconate 50 MG tablet Take 50 mg by mouth daily.     b complex vitamins tablet Take 1 tablet by mouth daily. (Patient not taking: Reported on 07/03/2023)     Blood Glucose Monitoring Suppl (ACCU-CHEK GUIDE ME) w/Device KIT USE TO TEST BLOOD SUGAR  ONCE DAILY (Patient not taking: Reported on 07/03/2023) 1 kit 0   No current facility-administered medications for this visit.    Review of Systems  Constitutional:  Positive for fatigue. Negative for appetite change, chills and fever.  HENT:   Negative for hearing loss and voice change.   Eyes:  Negative for eye problems.  Respiratory:  Negative for chest tightness and cough.   Cardiovascular:  Negative for chest pain.  Gastrointestinal:  Negative  for abdominal distention, abdominal pain and blood in stool.  Endocrine: Negative for hot flashes.  Genitourinary:  Negative for difficulty urinating and frequency.   Musculoskeletal:  Negative for arthralgias.  Skin:  Negative for itching and rash.  Neurological:  Negative for extremity weakness.  Hematological:  Negative for adenopathy.  Psychiatric/Behavioral:  Negative for confusion.    PHYSICAL EXAMINATION: ECOG PERFORMANCE STATUS: 1 - Symptomatic but completely ambulatory Vitals:   07/03/23 0941  Resp: 18  Temp: (!) 97.3 F (36.3 C)   Filed Weights   07/03/23 0941  Weight: 165 lb 1.6 oz (74.9 kg)    Physical  Exam Constitutional:      General: She is not in acute distress. HENT:     Head: Normocephalic and atraumatic.  Eyes:     General: No scleral icterus. Cardiovascular:     Rate and Rhythm: Normal rate and regular rhythm.     Heart sounds: Normal heart sounds.  Pulmonary:     Effort: Pulmonary effort is normal. No respiratory distress.     Breath sounds: No wheezing.  Abdominal:     General: Bowel sounds are normal. There is no distension.     Palpations: Abdomen is soft.  Musculoskeletal:        General: No deformity. Normal range of motion.     Cervical back: Normal range of motion and neck supple.  Skin:    General: Skin is warm and dry.     Findings: No erythema or rash.  Neurological:     Mental Status: She is alert and oriented to person, place, and time. Mental status is at baseline.     Coordination: Coordination abnormal.  Psychiatric:        Mood and Affect: Mood normal.     LABORATORY DATA:  I have reviewed the data as listed    Latest Ref Rng & Units 12/21/2022    3:03 PM 03/16/2021   12:00 AM 01/02/2020   12:00 AM  CBC  WBC 3.4 - 10.8 x10E3/uL 5.7  5.7     5.0      Hemoglobin 11.1 - 15.9 g/dL 9.4  9.6     9.6      Hematocrit 34.0 - 46.6 % 29.8  30     30       Platelets 150 - 450 x10E3/uL 229  233     206         This result is from an external source.      Latest Ref Rng & Units 12/21/2022    3:03 PM 04/29/2022   12:58 PM 03/16/2021   12:00 AM  CMP  Glucose 70 - 99 mg/dL 401  027    BUN 8 - 27 mg/dL 40  61  41      Creatinine 0.57 - 1.00 mg/dL 2.53  6.64  1.5      Sodium 134 - 144 mmol/L 140  140  140      Potassium 3.5 - 5.2 mmol/L 5.6  5.1  5.1      Chloride 96 - 106 mmol/L 107  106  107      CO2 20 - 29 mmol/L 21  19  24       Calcium 8.7 - 10.3 mg/dL 9.1  9.2  9.3         This result is from an external source.      RADIOGRAPHIC STUDIES: I have personally reviewed the radiological images as listed and agreed with the findings  in the  report. No results found.

## 2023-07-04 LAB — KAPPA/LAMBDA LIGHT CHAINS
Kappa free light chain: 37.5 mg/L — ABNORMAL HIGH (ref 3.3–19.4)
Kappa, lambda light chain ratio: 2.13 — ABNORMAL HIGH (ref 0.26–1.65)
Lambda free light chains: 17.6 mg/L (ref 5.7–26.3)

## 2023-07-05 LAB — MULTIPLE MYELOMA PANEL, SERUM
Albumin SerPl Elph-Mcnc: 3.7 g/dL (ref 2.9–4.4)
Albumin/Glob SerPl: 1.4 (ref 0.7–1.7)
Alpha 1: 0.2 g/dL (ref 0.0–0.4)
Alpha2 Glob SerPl Elph-Mcnc: 0.7 g/dL (ref 0.4–1.0)
B-Globulin SerPl Elph-Mcnc: 0.9 g/dL (ref 0.7–1.3)
Gamma Glob SerPl Elph-Mcnc: 0.9 g/dL (ref 0.4–1.8)
Globulin, Total: 2.7 g/dL (ref 2.2–3.9)
IgA: 230 mg/dL (ref 64–422)
IgG (Immunoglobin G), Serum: 953 mg/dL (ref 586–1602)
IgM (Immunoglobulin M), Srm: 62 mg/dL (ref 26–217)
Total Protein ELP: 6.4 g/dL (ref 6.0–8.5)

## 2023-07-12 ENCOUNTER — Inpatient Hospital Stay

## 2023-07-13 ENCOUNTER — Other Ambulatory Visit: Payer: Self-pay | Admitting: Family Medicine

## 2023-07-13 DIAGNOSIS — E113311 Type 2 diabetes mellitus with moderate nonproliferative diabetic retinopathy with macular edema, right eye: Secondary | ICD-10-CM

## 2023-07-19 ENCOUNTER — Inpatient Hospital Stay: Attending: Oncology

## 2023-07-25 ENCOUNTER — Other Ambulatory Visit: Payer: Self-pay | Admitting: Family Medicine

## 2023-07-25 DIAGNOSIS — I10 Essential (primary) hypertension: Secondary | ICD-10-CM

## 2023-07-26 ENCOUNTER — Inpatient Hospital Stay

## 2023-07-28 ENCOUNTER — Ambulatory Visit (INDEPENDENT_AMBULATORY_CARE_PROVIDER_SITE_OTHER): Payer: Self-pay | Admitting: Family Medicine

## 2023-07-28 ENCOUNTER — Encounter: Payer: Self-pay | Admitting: Family Medicine

## 2023-07-28 VITALS — BP 147/52 | HR 76 | Temp 97.9°F | Resp 16 | Ht 66.5 in | Wt 160.9 lb

## 2023-07-28 DIAGNOSIS — N1832 Chronic kidney disease, stage 3b: Secondary | ICD-10-CM | POA: Diagnosis not present

## 2023-07-28 DIAGNOSIS — I10 Essential (primary) hypertension: Secondary | ICD-10-CM

## 2023-07-28 DIAGNOSIS — E1122 Type 2 diabetes mellitus with diabetic chronic kidney disease: Secondary | ICD-10-CM

## 2023-07-28 DIAGNOSIS — E113311 Type 2 diabetes mellitus with moderate nonproliferative diabetic retinopathy with macular edema, right eye: Secondary | ICD-10-CM

## 2023-07-28 DIAGNOSIS — D649 Anemia, unspecified: Secondary | ICD-10-CM | POA: Diagnosis not present

## 2023-07-28 DIAGNOSIS — Z7984 Long term (current) use of oral hypoglycemic drugs: Secondary | ICD-10-CM | POA: Diagnosis not present

## 2023-07-28 LAB — POCT GLYCOSYLATED HEMOGLOBIN (HGB A1C)
Est. average glucose Bld gHb Est-mCnc: 134
Hemoglobin A1C: 6.3 % — AB (ref 4.0–5.6)

## 2023-07-28 NOTE — Patient Instructions (Signed)
 Rachel Lester  Please review the attached list of medications and notify my office if there are any errors.   . Please bring all of your medications to every appointment so we can make sure that our medication list is the same as yours.

## 2023-07-28 NOTE — Progress Notes (Signed)
 Established patient visit   Patient: Rachel Lester   DOB: January 09, 1941   83 y.o. Female  MRN: [id number] Visit Date: 07/28/2023  Today's healthcare provider: [name], MD   Chief Complaint  Patient presents with   Medical Management of Chronic Issues    4 month follow-up T2DM    Diabetes   Subjective    Discussed the use of AI scribe software for clinical note transcription with the patient, who gave verbal consent to proceed.  History of Present Illness   Rachel Lester is a 83 year old female with diabetes, CKD 3b, and anemia who presents for a follow-up on her blood sugar and iron levels. She feels well with no complaints, taking medications consistently every day.    Labs done by Dr. Thedore Mins in February found her hemoglobin was measured at 8.6. she was referred to hematology and repeat test showed hemoglobin of 9.1 with borderline low ferritin. She has increased her intake of red meat, previously consuming mostly chicken and fish. No unusual bleeding, and she denies feeling unusually tired or experiencing headaches, although her son-in-law perceives her as tired. She has been active, working in her garden, and reports feeling tired only at the end of the day, which she considers normal. She was scheduled for Venofer transfusions, but she elected not to go forward with them due to risk of complications.   Her diabetes is well-controlled with a recent A1c of 6.3, improved from 6.7 previously. No issues with her current medications.     Lab Results  Component Value Date   HGBA1C 6.3 (A) 07/28/2023   HGBA1C 6.9 (A) 03/22/2023   HGBA1C 8.2 (A) 12/22/2022   Lab Results  Component Value Date   WBC 5.2 07/03/2023   HGB 9.1 (L) 07/03/2023   HCT 29.3 (L) 07/03/2023   MCV 96.1 07/03/2023   PLT 199 07/03/2023   Lab Results  Component Value Date   NA 140 12/21/2022   K 5.6 (H) 12/21/2022   CREATININE 1.64 (H) 12/21/2022   EGFR 31 (L) 12/21/2022   GLUCOSE 109 (H)  12/21/2022  \  Medications: Outpatient Medications Prior to Visit  Medication Sig   ACCU-CHEK AVIVA PLUS test strip CHECK BLOOD SUGAR THREE  TIMES DAILY   amLODipine (NORVASC) 2.5 MG tablet TAKE 1 TABLET BY MOUTH DAILY   b complex vitamins tablet Take 1 tablet by mouth daily.   Blood Glucose Monitoring Suppl (ACCU-CHEK GUIDE ME) w/Device KIT USE TO TEST BLOOD SUGAR  ONCE DAILY   Cholecalciferol (VITAMIN D3) 2000 UNITS capsule Take 2,000 Units by mouth daily.   clopidogrel (PLAVIX) 75 MG tablet TAKE 1 TABLET BY MOUTH DAILY   Cyanocobalamin (VITAMIN B12) 500 MCG TABS Take by mouth.   FARXIGA 5 MG TABS tablet Take 1 tablet (5 mg total) by mouth daily.   Ferrous Sulfate (IRON) 325 (65 Fe) MG TABS Take by mouth.   glimepiride (AMARYL) 4 MG tablet TAKE 1 TABLET BY MOUTH IN THE  MORNING   losartan (COZAAR) 25 MG tablet Take 1 tablet by mouth daily.   Magnesium 500 MG TABS Take by mouth daily.   metFORMIN (GLUCOPHAGE) 500 MG tablet TAKE 1 TABLET BY MOUTH DAILY  WITH SUPPER   pioglitazone (ACTOS) 45 MG tablet TAKE 1 TABLET BY MOUTH DAILY   Probiotic Product (UP4 PROBIOTICS ADULT) CAPS Take by mouth.   rosuvastatin (CRESTOR) 20 MG tablet TAKE 1 TABLET BY MOUTH  DAILY   torsemide (DEMADEX) 20 MG  tablet Take 20 mg by mouth daily as needed.    vitamin C (ASCORBIC ACID) 500 MG tablet Take 500 mg by mouth daily.   zinc gluconate 50 MG tablet Take 50 mg by mouth daily.   pyridOXINE (VITAMIN B-6) 100 MG tablet Take 100 mg by mouth daily.   sodium zirconium cyclosilicate (LOKELMA) 10 g PACK packet Take 10 g by mouth every other day as needed.   No facility-administered medications prior to visit.   Review of Systems     Objective    BP (!) 147/52 (BP Location: Right Arm, Patient Position: Sitting, Cuff Size: Normal)   Pulse 76   Temp 97.9 F (36.6 C) (Oral)   Resp 16   Ht 5' 6.5" (1.689 m)   Wt 160 lb 14.4 oz (73 kg)   SpO2 100%   BMI 25.58 kg/m   Physical Exam   General appearance:  Well developed, well nourished female, cooperative and in no acute distress Head: Normocephalic, without obvious abnormality, atraumatic Respiratory: Respirations even and unlabored, normal respiratory rate Extremities: All extremities are intact.  Skin: Skin color, texture, turgor normal. No rashes seen  Psych: Appropriate mood and affect. Neurologic: Mental status: Alert, oriented to person, place, and time, thought content appropriate.     Results for orders placed or performed in visit on 07/28/23  POCT glycosylated hemoglobin (Hb A1C)  Result Value Ref Range   Hemoglobin A1C 6.3 (A) 4.0 - 5.6 %   Est. average glucose Bld gHb Est-mCnc 134     Assessment & Plan       Type 2 Diabetes Mellitus Diabetes is well-controlled with A1c of 6.3%, improved from 6.7%. - Continue current diabetes management plan. - Schedule follow-up appointment in early September for diabetes management.  Chronic Kidney Disease Under nephrologist Dr. Doristine Church care, contributing to anemia. No symptoms like fatigue or bleeding reported.  Anemia Anemia likely secondary to chronic kidney disease. Hemoglobin levels at 8.6 and 9.1 which has dropped very slowly over the last few year.  She is not inclined to proceed with iron infusions and is completely asymptomatic.  - check CBC and reticulocyte  - Monitor for symptoms of anemia such as fatigue or headaches. - Reassess need for iron infusion based on lab results and symptomatology.   Hypertension Doing well on current medications. BP a bit above target. Being followed by Dr. Thedore Mins. Previously on 5mg  amlodipine. Consider going back up to 5mg  if BP not improved at follow up.     Return in about 21 weeks (around 12/22/2023) for Diabetes.      [name], MD  [facility] [phone] (phone) [phone] (fax)  [facility]

## 2023-07-30 LAB — HEMOGLOBIN AND HEMATOCRIT, BLOOD
Hematocrit: 31.7 % — ABNORMAL LOW (ref 34.0–46.6)
Hemoglobin: 9.8 g/dL — ABNORMAL LOW (ref 11.1–15.9)

## 2023-07-30 LAB — ERYTHROPOIETIN: Erythropoietin: 10.1 m[IU]/mL (ref 2.6–18.5)

## 2023-07-30 LAB — RETICULOCYTES: Retic Ct Pct: 1.5 % (ref 0.6–2.6)

## 2023-08-02 ENCOUNTER — Other Ambulatory Visit: Payer: Self-pay | Admitting: Family Medicine

## 2023-08-02 ENCOUNTER — Inpatient Hospital Stay

## 2023-08-02 ENCOUNTER — Telehealth: Payer: Self-pay | Admitting: Oncology

## 2023-08-02 NOTE — Telephone Encounter (Signed)
 This patient sent a message through answering service to cancel her venofer infusion today @ 930 am. Team was notified.

## 2023-09-03 ENCOUNTER — Encounter: Payer: Self-pay | Admitting: Oncology

## 2023-09-15 DIAGNOSIS — I517 Cardiomegaly: Secondary | ICD-10-CM | POA: Diagnosis not present

## 2023-09-15 DIAGNOSIS — Z87891 Personal history of nicotine dependence: Secondary | ICD-10-CM | POA: Diagnosis not present

## 2023-09-15 DIAGNOSIS — E785 Hyperlipidemia, unspecified: Secondary | ICD-10-CM | POA: Diagnosis not present

## 2023-09-15 DIAGNOSIS — I6523 Occlusion and stenosis of bilateral carotid arteries: Secondary | ICD-10-CM | POA: Diagnosis not present

## 2023-09-15 DIAGNOSIS — I10 Essential (primary) hypertension: Secondary | ICD-10-CM | POA: Diagnosis not present

## 2023-09-15 DIAGNOSIS — R0602 Shortness of breath: Secondary | ICD-10-CM | POA: Diagnosis not present

## 2023-09-15 DIAGNOSIS — Z8673 Personal history of transient ischemic attack (TIA), and cerebral infarction without residual deficits: Secondary | ICD-10-CM | POA: Diagnosis not present

## 2023-09-15 DIAGNOSIS — Z7901 Long term (current) use of anticoagulants: Secondary | ICD-10-CM | POA: Diagnosis not present

## 2023-09-18 DIAGNOSIS — E1122 Type 2 diabetes mellitus with diabetic chronic kidney disease: Secondary | ICD-10-CM | POA: Diagnosis not present

## 2023-09-18 DIAGNOSIS — R600 Localized edema: Secondary | ICD-10-CM | POA: Diagnosis not present

## 2023-09-18 DIAGNOSIS — N2581 Secondary hyperparathyroidism of renal origin: Secondary | ICD-10-CM | POA: Diagnosis not present

## 2023-09-18 DIAGNOSIS — N1832 Chronic kidney disease, stage 3b: Secondary | ICD-10-CM | POA: Diagnosis not present

## 2023-09-18 DIAGNOSIS — I10 Essential (primary) hypertension: Secondary | ICD-10-CM | POA: Diagnosis not present

## 2023-09-18 LAB — BASIC METABOLIC PANEL WITH GFR
BUN: 42 — AB (ref 4–21)
Creatinine: 1.8 — AB (ref 0.5–1.1)
Potassium: 5.6 meq/L — AB (ref 3.5–5.1)
Sodium: 135 — AB (ref 137–147)

## 2023-09-18 LAB — MICROALBUMIN / CREATININE URINE RATIO: Microalb Creat Ratio: 273

## 2023-09-18 LAB — PROTEIN / CREATININE RATIO, URINE: Creatinine, Urine: 63

## 2023-09-18 LAB — MICROALBUMIN, URINE: Microalb, Ur: 17.2

## 2023-09-18 LAB — COMPREHENSIVE METABOLIC PANEL WITH GFR: eGFR: 29

## 2023-09-21 DIAGNOSIS — D631 Anemia in chronic kidney disease: Secondary | ICD-10-CM | POA: Diagnosis not present

## 2023-09-21 DIAGNOSIS — E1129 Type 2 diabetes mellitus with other diabetic kidney complication: Secondary | ICD-10-CM | POA: Diagnosis not present

## 2023-09-21 DIAGNOSIS — I10 Essential (primary) hypertension: Secondary | ICD-10-CM | POA: Diagnosis not present

## 2023-09-21 DIAGNOSIS — N2581 Secondary hyperparathyroidism of renal origin: Secondary | ICD-10-CM | POA: Diagnosis not present

## 2023-09-21 DIAGNOSIS — R809 Proteinuria, unspecified: Secondary | ICD-10-CM | POA: Diagnosis not present

## 2023-09-21 DIAGNOSIS — R600 Localized edema: Secondary | ICD-10-CM | POA: Diagnosis not present

## 2023-09-21 DIAGNOSIS — N184 Chronic kidney disease, stage 4 (severe): Secondary | ICD-10-CM | POA: Diagnosis not present

## 2023-09-21 DIAGNOSIS — E1122 Type 2 diabetes mellitus with diabetic chronic kidney disease: Secondary | ICD-10-CM | POA: Diagnosis not present

## 2023-10-02 ENCOUNTER — Telehealth: Payer: Self-pay | Admitting: *Deleted

## 2023-10-02 NOTE — Telephone Encounter (Signed)
 Patient said she is already called and asked to cancel the June appointments 1 was for lab then 2 days later was see the doctor and if she needed IV iron they would do that on that day.  Patient got in touch with her PCP they did another thing of the labs and the numbers look good so she does not have to come over here.  Asked her if we need to check her in the future and she says no she does not need anything from the cancer center.

## 2023-10-03 ENCOUNTER — Inpatient Hospital Stay: Payer: Self-pay

## 2023-10-05 ENCOUNTER — Ambulatory Visit: Admitting: Oncology

## 2023-10-05 ENCOUNTER — Ambulatory Visit

## 2023-10-06 DIAGNOSIS — I517 Cardiomegaly: Secondary | ICD-10-CM | POA: Diagnosis not present

## 2023-10-30 ENCOUNTER — Other Ambulatory Visit: Payer: Self-pay | Admitting: Family Medicine

## 2023-11-01 ENCOUNTER — Other Ambulatory Visit: Payer: Self-pay | Admitting: Family Medicine

## 2023-11-01 DIAGNOSIS — N1832 Chronic kidney disease, stage 3b: Secondary | ICD-10-CM

## 2023-11-01 MED ORDER — ACCU-CHEK GUIDE ME W/DEVICE KIT
PACK | 0 refills | Status: AC
Start: 2023-11-01 — End: ?

## 2023-11-18 DIAGNOSIS — W57XXXA Bitten or stung by nonvenomous insect and other nonvenomous arthropods, initial encounter: Secondary | ICD-10-CM | POA: Diagnosis not present

## 2023-11-18 DIAGNOSIS — T63461A Toxic effect of venom of wasps, accidental (unintentional), initial encounter: Secondary | ICD-10-CM | POA: Diagnosis not present

## 2023-11-18 DIAGNOSIS — L03213 Periorbital cellulitis: Secondary | ICD-10-CM | POA: Diagnosis not present

## 2023-11-29 ENCOUNTER — Encounter: Payer: Self-pay | Admitting: Oncology

## 2023-12-29 ENCOUNTER — Ambulatory Visit: Admitting: Family Medicine

## 2023-12-29 ENCOUNTER — Encounter: Payer: Self-pay | Admitting: Family Medicine

## 2023-12-29 VITALS — BP 152/62 | HR 78 | Ht 67.0 in | Wt 164.0 lb

## 2023-12-29 DIAGNOSIS — Z23 Encounter for immunization: Secondary | ICD-10-CM | POA: Diagnosis not present

## 2023-12-29 DIAGNOSIS — E1122 Type 2 diabetes mellitus with diabetic chronic kidney disease: Secondary | ICD-10-CM

## 2023-12-29 DIAGNOSIS — N1832 Chronic kidney disease, stage 3b: Secondary | ICD-10-CM | POA: Diagnosis not present

## 2023-12-29 DIAGNOSIS — I10 Essential (primary) hypertension: Secondary | ICD-10-CM | POA: Diagnosis not present

## 2023-12-29 LAB — POCT GLYCOSYLATED HEMOGLOBIN (HGB A1C): Hemoglobin A1C: 6.8 % — AB (ref 4.0–5.6)

## 2023-12-29 MED ORDER — AMLODIPINE BESYLATE 5 MG PO TABS
5.0000 mg | ORAL_TABLET | Freq: Every day | ORAL | 3 refills | Status: AC
Start: 2023-12-29 — End: ?

## 2023-12-29 NOTE — Progress Notes (Signed)
 Established patient visit   Patient: Rachel Lester   DOB: 03/18/1941   83 y.o. Female  MRN: [id number] Visit Date: 12/29/2023  Today's healthcare provider: Nancyann Perry, MD   Chief Complaint  Patient presents with   Medical Management of Chronic Issues   Diabetes    Symptoms:none Home blood sugar records: fasting range: 120-150 mg/dL Episodes of hypoglycemia? No  Current insulin regiment: none Most Recent Eye Exam: 01/12/23; upcoming appt on the 27th Current exercise: bicycling, yard work, and treadmill Current diet habits: in general, a healthy diet  , and sometimes keto friendly    Subjective    Discussed the use of AI scribe software for clinical note transcription with the patient, who gave verbal consent to proceed.  History of Present Illness   Rachel Lester is an 83 year old female with hypertension and diabetes who presents for follow-up after recent prednisone  treatment.  She completed a course of prednisone  about a month ago, which led to an increase in her blood sugar levels. Her A1c is currently 6.8%, and her blood sugar has stabilized since completing the treatment.  She monitors her blood pressure at home, which typically ranges from 130s to 140s. Today, her blood pressure was elevated, which she attributes to rushing earlier in the day. She is currently taking losartan and a low dose of amlodipine  for hypertension. No dizziness, lightheadedness, or swelling.       Medications: Outpatient Medications Prior to Visit  Medication Sig   ACCU-CHEK GUIDE TEST test strip CHECK BLOOD SUGAR DAILY   b complex vitamins tablet Take 1 tablet by mouth daily.   Blood Glucose Monitoring Suppl (ACCU-CHEK GUIDE ME) w/Device KIT USE TO TEST BLOOD SUGAR  ONCE DAILY   Cholecalciferol (VITAMIN D3) 2000 UNITS capsule Take 2,000 Units by mouth daily.   clopidogrel  (PLAVIX ) 75 MG tablet TAKE 1 TABLET BY MOUTH DAILY   Cyanocobalamin  (VITAMIN B12) 500 MCG TABS Take by  mouth.   FARXIGA  5 MG TABS tablet Take 1 tablet (5 mg total) by mouth daily.   Ferrous Sulfate (IRON) 325 (65 Fe) MG TABS Take by mouth.   glimepiride  (AMARYL ) 4 MG tablet TAKE 1 TABLET BY MOUTH IN THE  MORNING   losartan (COZAAR) 25 MG tablet Take 1 tablet by mouth daily.   Magnesium 500 MG TABS Take by mouth daily.   metFORMIN  (GLUCOPHAGE ) 500 MG tablet TAKE 1 TABLET BY MOUTH DAILY  WITH SUPPER   pioglitazone  (ACTOS ) 45 MG tablet TAKE 1 TABLET BY MOUTH DAILY   Probiotic Product (UP4 PROBIOTICS ADULT) CAPS Take by mouth.   pyridOXINE (VITAMIN B-6) 100 MG tablet Take 100 mg by mouth daily.   rosuvastatin  (CRESTOR ) 20 MG tablet TAKE 1 TABLET BY MOUTH  DAILY   sodium zirconium cyclosilicate (LOKELMA) 10 g PACK packet Take 10 g by mouth every other day as needed.   torsemide (DEMADEX) 20 MG tablet Take 20 mg by mouth daily as needed.    vitamin C (ASCORBIC ACID) 500 MG tablet Take 500 mg by mouth daily.   zinc gluconate 50 MG tablet Take 50 mg by mouth daily.   [DISCONTINUED] amLODipine  (NORVASC ) 2.5 MG tablet TAKE 1 TABLET BY MOUTH DAILY   No facility-administered medications prior to visit.   Review of Systems     Objective    BP (!) 152/62   Pulse 78   Ht 5' 7 (1.702 m)   Wt 164 lb (74.4 kg)   SpO2 100%  BMI 25.69 kg/m   Physical Exam   General: Appearance:    Well developed, well nourished female in no acute distress  Eyes:    PERRL, conjunctiva/corneas clear, EOM's intact       Lungs:     Clear to auscultation bilaterally, respirations unlabored  Heart:    Normal heart rate. Normal rhythm. No murmurs, rubs, or gallops.    MS:   All extremities are intact.    Neurologic:   Awake, alert, oriented x 3. No apparent focal neurological defect.        Lab Results  Component Value Date   HGBA1C 6.8 (A) 12/29/2023     Assessment & Plan    1. Type 2 diabetes mellitus with stage 3b chronic kidney disease, without long-term current use of insulin (HCC) (Primary) A1c at goal.  Continue follow up with nephrology as scheduled for CKD.   2. Primary hypertension Increase from 2.5 to  amLODipine  (NORVASC ) 5 MG tablet; Take 1 tablet (5 mg total) by mouth daily.  Dispense: 100 tablet; Refill: 3  3. Immunization due  - Flu vaccine HIGH DOSE PF(Fluzone Trivalent)   Return in about 4 months (around 04/29/2024).     Nancyann Perry, MD  [facility] [phone] (phone) [phone] (fax)  [facility]

## 2023-12-29 NOTE — Patient Instructions (Signed)
 Rachel Lester  Please review the attached list of medications and notify my office if there are any errors.   . Please bring all of your medications to every appointment so we can make sure that our medication list is the same as yours.

## 2023-12-29 NOTE — Progress Notes (Deleted)
 SABRA

## 2024-01-01 DIAGNOSIS — N2581 Secondary hyperparathyroidism of renal origin: Secondary | ICD-10-CM | POA: Diagnosis not present

## 2024-01-01 DIAGNOSIS — N184 Chronic kidney disease, stage 4 (severe): Secondary | ICD-10-CM | POA: Diagnosis not present

## 2024-01-01 DIAGNOSIS — I10 Essential (primary) hypertension: Secondary | ICD-10-CM | POA: Diagnosis not present

## 2024-01-01 DIAGNOSIS — E1122 Type 2 diabetes mellitus with diabetic chronic kidney disease: Secondary | ICD-10-CM | POA: Diagnosis not present

## 2024-01-01 DIAGNOSIS — E1129 Type 2 diabetes mellitus with other diabetic kidney complication: Secondary | ICD-10-CM | POA: Diagnosis not present

## 2024-01-02 DIAGNOSIS — N2581 Secondary hyperparathyroidism of renal origin: Secondary | ICD-10-CM | POA: Diagnosis not present

## 2024-01-02 DIAGNOSIS — R809 Proteinuria, unspecified: Secondary | ICD-10-CM | POA: Diagnosis not present

## 2024-01-02 DIAGNOSIS — R600 Localized edema: Secondary | ICD-10-CM | POA: Diagnosis not present

## 2024-01-02 DIAGNOSIS — E1122 Type 2 diabetes mellitus with diabetic chronic kidney disease: Secondary | ICD-10-CM | POA: Diagnosis not present

## 2024-01-02 DIAGNOSIS — D631 Anemia in chronic kidney disease: Secondary | ICD-10-CM | POA: Diagnosis not present

## 2024-01-02 DIAGNOSIS — I10 Essential (primary) hypertension: Secondary | ICD-10-CM | POA: Diagnosis not present

## 2024-01-02 DIAGNOSIS — N184 Chronic kidney disease, stage 4 (severe): Secondary | ICD-10-CM | POA: Diagnosis not present

## 2024-01-02 DIAGNOSIS — E1129 Type 2 diabetes mellitus with other diabetic kidney complication: Secondary | ICD-10-CM | POA: Diagnosis not present

## 2024-01-15 DIAGNOSIS — H02403 Unspecified ptosis of bilateral eyelids: Secondary | ICD-10-CM | POA: Diagnosis not present

## 2024-01-15 DIAGNOSIS — Z961 Presence of intraocular lens: Secondary | ICD-10-CM | POA: Diagnosis not present

## 2024-01-15 DIAGNOSIS — E113293 Type 2 diabetes mellitus with mild nonproliferative diabetic retinopathy without macular edema, bilateral: Secondary | ICD-10-CM | POA: Diagnosis not present

## 2024-01-15 DIAGNOSIS — H43813 Vitreous degeneration, bilateral: Secondary | ICD-10-CM | POA: Diagnosis not present

## 2024-01-15 DIAGNOSIS — E113311 Type 2 diabetes mellitus with moderate nonproliferative diabetic retinopathy with macular edema, right eye: Secondary | ICD-10-CM | POA: Diagnosis not present

## 2024-01-15 LAB — OPHTHALMOLOGY REPORT-SCANNED

## 2024-03-16 ENCOUNTER — Other Ambulatory Visit: Payer: Self-pay | Admitting: Family Medicine

## 2024-03-16 DIAGNOSIS — E1122 Type 2 diabetes mellitus with diabetic chronic kidney disease: Secondary | ICD-10-CM

## 2024-04-29 ENCOUNTER — Ambulatory Visit (INDEPENDENT_AMBULATORY_CARE_PROVIDER_SITE_OTHER): Admitting: Family Medicine

## 2024-04-29 ENCOUNTER — Encounter: Payer: Self-pay | Admitting: Family Medicine

## 2024-04-29 VITALS — BP 124/56 | HR 77 | Resp 16 | Ht 67.0 in | Wt 159.0 lb

## 2024-04-29 DIAGNOSIS — E7800 Pure hypercholesterolemia, unspecified: Secondary | ICD-10-CM | POA: Diagnosis not present

## 2024-04-29 DIAGNOSIS — Z7984 Long term (current) use of oral hypoglycemic drugs: Secondary | ICD-10-CM

## 2024-04-29 DIAGNOSIS — E1122 Type 2 diabetes mellitus with diabetic chronic kidney disease: Secondary | ICD-10-CM | POA: Diagnosis not present

## 2024-04-29 DIAGNOSIS — D631 Anemia in chronic kidney disease: Secondary | ICD-10-CM | POA: Diagnosis not present

## 2024-04-29 DIAGNOSIS — N184 Chronic kidney disease, stage 4 (severe): Secondary | ICD-10-CM

## 2024-04-29 DIAGNOSIS — I10 Essential (primary) hypertension: Secondary | ICD-10-CM | POA: Diagnosis not present

## 2024-04-29 NOTE — Progress Notes (Signed)
 "     Established patient visit   Patient: Rachel Lester   DOB: 01/08/1941   84 y.o. Female  MRN: [id number] Visit Date: 04/29/2024  Today's healthcare provider: Nancyann Perry, MD   Chief Complaint  Patient presents with   Follow-up    4 month f/u (DM) No other concerns   Subjective    Discussed the use of AI scribe software for clinical note transcription with the patient, who gave verbal consent to proceed.  History of Present Illness   Rachel Lester is an 84 year old female with hypertension, hyperlipidemia, and type 2 diabetes who presents for a follow-up visit.  Her blood sugar levels are stable with no episodes of hypoglycemia. She manages her diabetes with Amaryl , metformin , Farxiga , and pioglitazone .  Blood pressure readings at home range from the 120s to as high as 150. She does not regularly monitor her blood pressure at home.  No chest pain, heart palpitations, or shortness of breath.  She does not require any prescription refills at this time and mentions that her test strips are now being provided regularly. She has an upcoming appointment with Dr. Dennise, although she is unsure of the exact date.     Lab Results  Component Value Date   NA 135 (A) 09/18/2023   K 5.6 (A) 09/18/2023   CREATININE 1.8 (A) 09/18/2023   EGFR 29 09/18/2023   GLUCOSE 109 (H) 12/21/2022   Lab Results  Component Value Date   HGBA1C 6.8 (A) 12/29/2023   HGBA1C 6.3 (A) 07/28/2023   HGBA1C 6.9 (A) 03/22/2023   Lab Results  Component Value Date   CHOL 123 12/21/2022   HDL 60 12/21/2022   LDLCALC 49 12/21/2022   TRIG 69 12/21/2022   CHOLHDL 2.1 12/21/2022     Medications: Show/hide medication list[1] Review of Systems  Constitutional:  Negative for appetite change, chills, fatigue and fever.  Respiratory:  Negative for chest tightness and shortness of breath.   Cardiovascular:  Negative for chest pain and palpitations.  Gastrointestinal:  Negative for abdominal pain, nausea  and vomiting.  Neurological:  Negative for dizziness and weakness.       Objective    BP (!) 124/56 (BP Location: Right Arm, Patient Position: Sitting, Cuff Size: Normal)   Pulse 77   Resp 16   Ht 5' 7 (1.702 m)   Wt 159 lb (72.1 kg)   SpO2 100%   BMI 24.90 kg/m   Physical Exam   General: Appearance:    Well developed, well nourished female in no acute distress  Eyes:    PERRL, conjunctiva/corneas clear, EOM's intact       Lungs:     Clear to auscultation bilaterally, respirations unlabored  Heart:    Normal heart rate. Normal rhythm. No murmurs, rubs, or gallops.    MS:   All extremities are intact.    Neurologic:   Awake, alert, oriented x 3. No apparent focal neurological defect.         Assessment & Plan    1. Type 2 diabetes mellitus with stage 3b chronic kidney disease, without long-term current use of insulin (HCC) (Primary) Doing well on current medications. Has upcoming appointment with nephrology.  - Hemoglobin A1c - Parathyroid  hormone, intact (no Ca)  - Renal function panel - Magnesium - Hepatic function panel  2. Primary hypertension Well controlled.  Continue current medications.    3. CKD (chronic kidney disease) stage 4, GFR 15-29 ml/min (HCC) Stable on current  medication. Follow up nephrology as scheduled.   4. Anemia in stage 4 chronic kidney disease (HCC) - CBC with Differential/Platelet  5. Hypercholesterolemia She is tolerating rosuvastatin  well with no adverse effects.   - Lipid Panel With LDL/HDL Ratio  Return in about 4 months (around 08/27/2024).      Nancyann Perry, MD  [facility] [phone] (phone) [phone] (fax)  [facility]    [1]  Outpatient Medications Prior to Visit  Medication Sig   ACCU-CHEK GUIDE TEST test strip CHECK BLOOD SUGAR DAILY   amLODipine  (NORVASC ) 5 MG tablet Take 1 tablet (5 mg total) by mouth daily.   b complex vitamins tablet Take 1 tablet by mouth daily.    Blood Glucose Monitoring Suppl (ACCU-CHEK GUIDE ME) w/Device KIT USE TO TEST BLOOD SUGAR  ONCE DAILY   Cholecalciferol (VITAMIN D3) 2000 UNITS capsule Take 2,000 Units by mouth daily.   clopidogrel  (PLAVIX ) 75 MG tablet TAKE 1 TABLET BY MOUTH DAILY   Cyanocobalamin  (VITAMIN B12) 500 MCG TABS Take by mouth.   FARXIGA  5 MG TABS tablet Take 1 tablet (5 mg total) by mouth daily.   Ferrous Sulfate (IRON) 325 (65 Fe) MG TABS Take by mouth.   glimepiride  (AMARYL ) 4 MG tablet TAKE 1 TABLET BY MOUTH IN THE  MORNING   losartan (COZAAR) 25 MG tablet Take 1 tablet by mouth daily.   Magnesium 500 MG TABS Take by mouth daily.   metFORMIN  (GLUCOPHAGE ) 500 MG tablet TAKE 1 TABLET BY MOUTH DAILY  WITH SUPPER   pioglitazone  (ACTOS ) 45 MG tablet TAKE 1 TABLET BY MOUTH DAILY   Probiotic Product (UP4 PROBIOTICS ADULT) CAPS Take by mouth.   pyridOXINE (VITAMIN B-6) 100 MG tablet Take 100 mg by mouth daily.   rosuvastatin  (CRESTOR ) 20 MG tablet TAKE 1 TABLET BY MOUTH  DAILY   sodium zirconium cyclosilicate (LOKELMA) 10 g PACK packet Take 10 g by mouth every other day as needed.   torsemide (DEMADEX) 20 MG tablet Take 20 mg by mouth daily as needed.    vitamin C (ASCORBIC ACID) 500 MG tablet Take 500 mg by mouth daily.   zinc gluconate 50 MG tablet Take 50 mg by mouth daily.   No facility-administered medications prior to visit.   "

## 2024-04-29 NOTE — Patient Instructions (Signed)
 SABRA  Please review the attached list of medications and notify my office if there are any errors.   . Please bring all of your medications to every appointment so we can make sure that our medication list is the same as yours.

## 2024-04-30 ENCOUNTER — Ambulatory Visit: Payer: Self-pay | Admitting: Family Medicine

## 2024-04-30 LAB — HEMOGLOBIN A1C
Est. average glucose Bld gHb Est-mCnc: 157 mg/dL
Hgb A1c MFr Bld: 7.1 % — ABNORMAL HIGH (ref 4.8–5.6)

## 2024-04-30 LAB — RENAL FUNCTION PANEL
Albumin: 4.4 g/dL (ref 3.7–4.7)
BUN/Creatinine Ratio: 22 (ref 12–28)
BUN: 43 mg/dL — ABNORMAL HIGH (ref 8–27)
CO2: 19 mmol/L — ABNORMAL LOW (ref 20–29)
Calcium: 9 mg/dL (ref 8.7–10.3)
Chloride: 108 mmol/L — ABNORMAL HIGH (ref 96–106)
Creatinine, Ser: 1.92 mg/dL — ABNORMAL HIGH (ref 0.57–1.00)
Glucose: 139 mg/dL — ABNORMAL HIGH (ref 70–99)
Phosphorus: 3.6 mg/dL (ref 3.0–4.3)
Potassium: 5.1 mmol/L (ref 3.5–5.2)
Sodium: 140 mmol/L (ref 134–144)
eGFR: 26 mL/min/1.73 — ABNORMAL LOW

## 2024-04-30 LAB — CBC WITH DIFFERENTIAL/PLATELET
Basophils Absolute: 0 x10E3/uL (ref 0.0–0.2)
Basos: 0 %
EOS (ABSOLUTE): 0.2 x10E3/uL (ref 0.0–0.4)
Eos: 3 %
Hematocrit: 32.1 % — ABNORMAL LOW (ref 34.0–46.6)
Hemoglobin: 10.3 g/dL — ABNORMAL LOW (ref 11.1–15.9)
Immature Grans (Abs): 0 x10E3/uL (ref 0.0–0.1)
Immature Granulocytes: 0 %
Lymphocytes Absolute: 0.7 x10E3/uL (ref 0.7–3.1)
Lymphs: 9 %
MCH: 30.3 pg (ref 26.6–33.0)
MCHC: 32.1 g/dL (ref 31.5–35.7)
MCV: 94 fL (ref 79–97)
Monocytes Absolute: 0.4 x10E3/uL (ref 0.1–0.9)
Monocytes: 5 %
Neutrophils Absolute: 6.4 x10E3/uL (ref 1.4–7.0)
Neutrophils: 83 %
Platelets: 267 x10E3/uL (ref 150–450)
RBC: 3.4 x10E6/uL — ABNORMAL LOW (ref 3.77–5.28)
RDW: 12.9 % (ref 11.7–15.4)
WBC: 7.6 x10E3/uL (ref 3.4–10.8)

## 2024-04-30 LAB — LIPID PANEL WITH LDL/HDL RATIO
Cholesterol, Total: 145 mg/dL (ref 100–199)
HDL: 58 mg/dL
LDL Chol Calc (NIH): 71 mg/dL (ref 0–99)
LDL/HDL Ratio: 1.2 ratio (ref 0.0–3.2)
Triglycerides: 86 mg/dL (ref 0–149)
VLDL Cholesterol Cal: 16 mg/dL (ref 5–40)

## 2024-04-30 LAB — HEPATIC FUNCTION PANEL
ALT: 9 IU/L (ref 0–32)
AST: 16 IU/L (ref 0–40)
Alkaline Phosphatase: 77 IU/L (ref 48–129)
Bilirubin Total: <0.2 mg/dL (ref 0.0–1.2)
Bilirubin, Direct: 0.09 mg/dL (ref 0.00–0.40)
Total Protein: 6.9 g/dL (ref 6.0–8.5)

## 2024-04-30 LAB — PARATHYROID HORMONE, INTACT (NO CA): PTH: 33 pg/mL (ref 15–65)

## 2024-04-30 LAB — MAGNESIUM: Magnesium: 2.4 mg/dL — ABNORMAL HIGH (ref 1.6–2.3)

## 2024-05-06 ENCOUNTER — Other Ambulatory Visit: Payer: Self-pay | Admitting: Family Medicine

## 2024-05-06 DIAGNOSIS — E113311 Type 2 diabetes mellitus with moderate nonproliferative diabetic retinopathy with macular edema, right eye: Secondary | ICD-10-CM

## 2024-05-06 MED ORDER — GLIMEPIRIDE 4 MG PO TABS: 4.0000 mg | ORAL_TABLET | Freq: Every morning | ORAL | 3 refills | Status: AC

## 2024-05-06 MED ORDER — PIOGLITAZONE HCL 45 MG PO TABS: 45.0000 mg | ORAL_TABLET | Freq: Every day | ORAL | 3 refills | Status: AC

## 2024-05-06 MED ORDER — CLOPIDOGREL BISULFATE 75 MG PO TABS: 75.0000 mg | ORAL_TABLET | Freq: Every day | ORAL | 3 refills | Status: AC

## 2024-06-04 ENCOUNTER — Ambulatory Visit: Payer: Medicare Other

## 2024-08-28 ENCOUNTER — Ambulatory Visit: Admitting: Family Medicine
